# Patient Record
Sex: Female | Born: 1950 | Race: White | Hispanic: No | Smoking: Former smoker
Health system: Southern US, Community
[De-identification: ages and names within clinical notes are randomized; demographics above are authoritative.]

## PROBLEM LIST (undated history)

## (undated) DIAGNOSIS — G459 Transient cerebral ischemic attack, unspecified: Secondary | ICD-10-CM

## (undated) DIAGNOSIS — J45909 Unspecified asthma, uncomplicated: Secondary | ICD-10-CM

## (undated) DIAGNOSIS — R519 Headache, unspecified: Secondary | ICD-10-CM

## (undated) DIAGNOSIS — G4730 Sleep apnea, unspecified: Secondary | ICD-10-CM

## (undated) DIAGNOSIS — R0600 Dyspnea, unspecified: Secondary | ICD-10-CM

## (undated) DIAGNOSIS — Z8489 Family history of other specified conditions: Secondary | ICD-10-CM

## (undated) DIAGNOSIS — M797 Fibromyalgia: Secondary | ICD-10-CM

## (undated) DIAGNOSIS — K219 Gastro-esophageal reflux disease without esophagitis: Secondary | ICD-10-CM

## (undated) DIAGNOSIS — M5020 Other cervical disc displacement, unspecified cervical region: Secondary | ICD-10-CM

## (undated) DIAGNOSIS — K8020 Calculus of gallbladder without cholecystitis without obstruction: Secondary | ICD-10-CM

## (undated) DIAGNOSIS — T7840XA Allergy, unspecified, initial encounter: Secondary | ICD-10-CM

## (undated) DIAGNOSIS — I739 Peripheral vascular disease, unspecified: Secondary | ICD-10-CM

## (undated) DIAGNOSIS — E785 Hyperlipidemia, unspecified: Secondary | ICD-10-CM

## (undated) DIAGNOSIS — I639 Cerebral infarction, unspecified: Secondary | ICD-10-CM

## (undated) DIAGNOSIS — J449 Chronic obstructive pulmonary disease, unspecified: Secondary | ICD-10-CM

## (undated) DIAGNOSIS — I6529 Occlusion and stenosis of unspecified carotid artery: Secondary | ICD-10-CM

## (undated) DIAGNOSIS — F411 Generalized anxiety disorder: Secondary | ICD-10-CM

## (undated) DIAGNOSIS — I10 Essential (primary) hypertension: Secondary | ICD-10-CM

## (undated) DIAGNOSIS — I2510 Atherosclerotic heart disease of native coronary artery without angina pectoris: Secondary | ICD-10-CM

## (undated) DIAGNOSIS — M5126 Other intervertebral disc displacement, lumbar region: Secondary | ICD-10-CM

## (undated) HISTORY — DX: Atherosclerotic heart disease of native coronary artery without angina pectoris: I25.10

## (undated) HISTORY — DX: Essential (primary) hypertension: I10

## (undated) HISTORY — DX: Occlusion and stenosis of unspecified carotid artery: I65.29

## (undated) HISTORY — DX: Calculus of gallbladder without cholecystitis without obstruction: K80.20

## (undated) HISTORY — PX: TUBAL LIGATION: SHX77

## (undated) HISTORY — DX: Other cervical disc displacement, unspecified cervical region: M50.20

## (undated) HISTORY — DX: Hyperlipidemia, unspecified: E78.5

## (undated) HISTORY — DX: Cerebral infarction, unspecified: I63.9

## (undated) HISTORY — DX: Other intervertebral disc displacement, lumbar region: M51.26

## (undated) HISTORY — DX: Fibromyalgia: M79.7

## (undated) HISTORY — PX: CERVICAL FUSION: SHX112

## (undated) HISTORY — DX: Chronic obstructive pulmonary disease, unspecified: J44.9

## (undated) HISTORY — DX: Unspecified asthma, uncomplicated: J45.909

## (undated) HISTORY — DX: Allergy, unspecified, initial encounter: T78.40XA

## (undated) HISTORY — PX: CHOLECYSTECTOMY: SHX55

## (undated) HISTORY — DX: Generalized anxiety disorder: F41.1

## (undated) HISTORY — DX: Gastro-esophageal reflux disease without esophagitis: K21.9

## (undated) HISTORY — DX: Transient cerebral ischemic attack, unspecified: G45.9

---

## 1991-07-28 HISTORY — PX: ECTOPIC PREGNANCY SURGERY: SHX613

## 2000-04-29 ENCOUNTER — Emergency Department (HOSPITAL_COMMUNITY): Admission: EM | Admit: 2000-04-29 | Discharge: 2000-04-29 | Payer: Self-pay | Admitting: Emergency Medicine

## 2000-11-25 ENCOUNTER — Ambulatory Visit (HOSPITAL_COMMUNITY): Admission: RE | Admit: 2000-11-25 | Discharge: 2000-11-25 | Payer: Self-pay | Admitting: Family Medicine

## 2000-11-25 ENCOUNTER — Encounter: Payer: Self-pay | Admitting: Family Medicine

## 2000-12-07 ENCOUNTER — Other Ambulatory Visit: Admission: RE | Admit: 2000-12-07 | Discharge: 2000-12-07 | Payer: Self-pay | Admitting: Gynecology

## 2001-01-06 ENCOUNTER — Other Ambulatory Visit: Admission: RE | Admit: 2001-01-06 | Discharge: 2001-01-06 | Payer: Self-pay | Admitting: Gynecology

## 2001-01-06 ENCOUNTER — Encounter (INDEPENDENT_AMBULATORY_CARE_PROVIDER_SITE_OTHER): Payer: Self-pay | Admitting: Specialist

## 2001-01-20 ENCOUNTER — Encounter: Payer: Self-pay | Admitting: Family Medicine

## 2001-01-20 ENCOUNTER — Ambulatory Visit (HOSPITAL_COMMUNITY): Admission: RE | Admit: 2001-01-20 | Discharge: 2001-01-20 | Payer: Self-pay | Admitting: Family Medicine

## 2001-10-06 ENCOUNTER — Encounter: Admission: RE | Admit: 2001-10-06 | Discharge: 2001-10-06 | Payer: Self-pay | Admitting: Gynecology

## 2001-10-06 ENCOUNTER — Encounter: Payer: Self-pay | Admitting: Gynecology

## 2002-03-07 ENCOUNTER — Encounter: Payer: Self-pay | Admitting: Family Medicine

## 2002-03-07 ENCOUNTER — Ambulatory Visit (HOSPITAL_COMMUNITY): Admission: RE | Admit: 2002-03-07 | Discharge: 2002-03-07 | Payer: Self-pay | Admitting: Family Medicine

## 2003-12-13 ENCOUNTER — Ambulatory Visit (HOSPITAL_COMMUNITY): Admission: RE | Admit: 2003-12-13 | Discharge: 2003-12-13 | Payer: Self-pay | Admitting: Family Medicine

## 2004-08-07 ENCOUNTER — Ambulatory Visit (HOSPITAL_COMMUNITY): Admission: RE | Admit: 2004-08-07 | Discharge: 2004-08-07 | Payer: Self-pay | Admitting: Family Medicine

## 2004-08-15 ENCOUNTER — Ambulatory Visit (HOSPITAL_COMMUNITY): Admission: RE | Admit: 2004-08-15 | Discharge: 2004-08-15 | Payer: Self-pay | Admitting: Family Medicine

## 2004-08-26 ENCOUNTER — Other Ambulatory Visit: Admission: RE | Admit: 2004-08-26 | Discharge: 2004-08-26 | Payer: Self-pay | Admitting: Gynecology

## 2004-08-28 ENCOUNTER — Encounter: Admission: RE | Admit: 2004-08-28 | Discharge: 2004-08-28 | Payer: Self-pay | Admitting: Gynecology

## 2005-11-16 ENCOUNTER — Other Ambulatory Visit: Admission: RE | Admit: 2005-11-16 | Discharge: 2005-11-16 | Payer: Self-pay | Admitting: Gynecology

## 2005-12-14 ENCOUNTER — Encounter: Admission: RE | Admit: 2005-12-14 | Discharge: 2005-12-14 | Payer: Self-pay | Admitting: Gynecology

## 2007-10-10 ENCOUNTER — Other Ambulatory Visit: Admission: RE | Admit: 2007-10-10 | Discharge: 2007-10-10 | Payer: Self-pay | Admitting: Gynecology

## 2007-10-17 ENCOUNTER — Encounter: Admission: RE | Admit: 2007-10-17 | Discharge: 2007-10-17 | Payer: Self-pay | Admitting: Gynecology

## 2008-07-23 ENCOUNTER — Ambulatory Visit: Payer: Self-pay | Admitting: Surgery

## 2009-01-21 ENCOUNTER — Ambulatory Visit: Payer: Self-pay | Admitting: Surgery

## 2009-02-01 ENCOUNTER — Encounter: Payer: Self-pay | Admitting: Surgery

## 2009-02-01 ENCOUNTER — Ambulatory Visit: Payer: Self-pay | Admitting: Surgery

## 2009-02-01 ENCOUNTER — Inpatient Hospital Stay (HOSPITAL_COMMUNITY): Admission: RE | Admit: 2009-02-01 | Discharge: 2009-02-02 | Payer: Self-pay | Admitting: Surgery

## 2009-02-01 HISTORY — PX: CAROTID ENDARTERECTOMY: SUR193

## 2009-03-04 ENCOUNTER — Ambulatory Visit: Payer: Self-pay | Admitting: Surgery

## 2009-09-16 ENCOUNTER — Ambulatory Visit: Payer: Self-pay | Admitting: Surgery

## 2010-03-14 ENCOUNTER — Ambulatory Visit: Payer: Self-pay | Admitting: Surgery

## 2010-08-17 ENCOUNTER — Encounter: Payer: Self-pay | Admitting: Gynecology

## 2010-11-02 LAB — PROTIME-INR
INR: 0.9 (ref 0.00–1.49)
Prothrombin Time: 12 seconds (ref 11.6–15.2)

## 2010-11-02 LAB — URINALYSIS, ROUTINE W REFLEX MICROSCOPIC
Bilirubin Urine: NEGATIVE
Glucose, UA: NEGATIVE mg/dL
Hgb urine dipstick: NEGATIVE
Ketones, ur: NEGATIVE mg/dL
Nitrite: NEGATIVE
Protein, ur: NEGATIVE mg/dL
Specific Gravity, Urine: 1.004 — ABNORMAL LOW (ref 1.005–1.030)
Urobilinogen, UA: 0.2 mg/dL (ref 0.0–1.0)
pH: 6 (ref 5.0–8.0)

## 2010-11-02 LAB — COMPREHENSIVE METABOLIC PANEL
Albumin: 4 g/dL (ref 3.5–5.2)
BUN: 7 mg/dL (ref 6–23)
Chloride: 108 mEq/L (ref 96–112)
Creatinine, Ser: 0.74 mg/dL (ref 0.4–1.2)
Total Bilirubin: 0.4 mg/dL (ref 0.3–1.2)

## 2010-11-02 LAB — ABO/RH: ABO/RH(D): O POS

## 2010-11-02 LAB — BASIC METABOLIC PANEL
CO2: 29 mEq/L (ref 19–32)
Calcium: 8.7 mg/dL (ref 8.4–10.5)
Chloride: 107 mEq/L (ref 96–112)
Glucose, Bld: 116 mg/dL — ABNORMAL HIGH (ref 70–99)
Sodium: 140 mEq/L (ref 135–145)

## 2010-11-02 LAB — CBC
HCT: 37.4 % (ref 36.0–46.0)
HCT: 43.9 % (ref 36.0–46.0)
Hemoglobin: 12.8 g/dL (ref 12.0–15.0)
MCHC: 34.3 g/dL (ref 30.0–36.0)
MCV: 88.1 fL (ref 78.0–100.0)
MCV: 88.4 fL (ref 78.0–100.0)
Platelets: 221 10*3/uL (ref 150–400)
Platelets: 257 10*3/uL (ref 150–400)
RBC: 4.24 MIL/uL (ref 3.87–5.11)
RDW: 12.9 % (ref 11.5–15.5)
RDW: 13.4 % (ref 11.5–15.5)
WBC: 17.3 10*3/uL — ABNORMAL HIGH (ref 4.0–10.5)
WBC: 9.9 10*3/uL (ref 4.0–10.5)

## 2010-11-02 LAB — TYPE AND SCREEN
ABO/RH(D): O POS
Antibody Screen: NEGATIVE

## 2010-11-02 LAB — APTT: aPTT: 24 seconds (ref 24–37)

## 2010-12-09 NOTE — Assessment & Plan Note (Signed)
OFFICE VISIT   Norma Scott, Norma Scott  DOB:  [birth year]                                       03/04/2009  [id number]#:[id number]   REASON FOR VISIT:  Follow up carotid endarterectomy.   HISTORY:  This is a 60 year old female who was found to have an  asymptomatic high-grade right carotid stenosis.  She underwent right  carotid endarterectomy on February 01, 2009, without incident.  She comes  back in today for followup.  She is been doing well at home.  She does  not have any neurologic deficits.  Her incision is well-healed,   From a carotid perspective, she had contralateral 40-59% stenosis, and I  will see her back in 6 months to evaluate her operative site as well as  the contralateral side.   I initially saw the patient for left leg complaints.  She states that  she cannot walk 1 block secondary to pain in her left leg.  However, she  describes her pain as a burning, electrical-type feeling that begins in  her back and shoots down all the way to her foot.  She does have a  duplex ultrasound which suggests ankle-brachial index of 0.83.  She has  a palpable left dorsalis pedis pulse.  With all of the available  information, I feel like her pain is most likely secondary to back  issues and neurological issues rather than arterial insufficiencies.  I  have reiterated this to the patient, and she states that she has known 2  herniated disks.  I think she should look into this to further evaluate  the pain she has in her left leg.  I will see her back in 6 months with  a repeat duplex.   Jorge Ny, MD  Electronically Signed   VWB/MEDQ  D:  03/04/2009  T:  03/05/2009  Job:  [id number]   cc:   Dario Guardian, M.D.

## 2010-12-09 NOTE — Procedures (Signed)
CAROTID DUPLEX EXAM   INDICATION:  Follow up carotid artery disease.   HISTORY:  Diabetes:  No.  Cardiac:  No.  Hypertension:  No.  Smoking:  Yes.  Previous Surgery:  Left carotid endarterectomy, July, 2010.  CV History:  No.  Amaurosis Fugax No, Paresthesias No, Hemiparesis No.                                       RIGHT             LEFT  Brachial systolic pressure:         138               150  Brachial Doppler waveforms:         WNL               WNL  Vertebral direction of flow:        Antegrade         Antegrade  DUPLEX VELOCITIES (cm/sec)  CCA peak systolic                   108               124  ECA peak systolic                   281               160  ICA peak systolic                   118               164  ICA end diastolic                   43                57  PLAQUE MORPHOLOGY:                                    Heterogenous  PLAQUE AMOUNT:                                        Mild  PLAQUE LOCATION:                                      ICA, ECA   IMPRESSION:  1. No restenosis noted, status post right carotid endarterectomy.      Elevated velocities may be the result of a change in diameter of      vessel.  2. 40-59% stenosis of the left internal carotid artery.  3. Bilateral external carotid artery stenosis.  4. Antegrade flow in bilateral vertebrals.        ___________________________________________  V. Charlena Cross, MD   CB/MEDQ  D:  09/16/2009  T:  09/17/2009  Job:  [id number]

## 2010-12-09 NOTE — Op Note (Signed)
Norma Scott, Norma Scott            ACCOUNT NO.:  [id number]   MEDICAL RECORD NO.:  [id number]          PATIENT TYPE:  INP   LOCATION:  2899                         FACILITY:  [facility]   PHYSICIAN:  Juleen China IV, MDDATE OF BIRTH:  January 13, 1951   DATE OF PROCEDURE:  02/01/2009  DATE OF DISCHARGE:                               OPERATIVE REPORT   PREOPERATIVE DIAGNOSIS:  Asymptomatic right carotid stenosis.   POSTOPERATIVE DIAGNOSIS:  Asymptomatic right carotid stenosis.   PROCEDURE PERFORMED:  Right carotid endarterectomy with patch  angioplasty.   SURGEON:  1. Durene Cal IV, MD   ANESTHESIA:  General.   ESTIMATED BLOOD LOSS:  100 mL   FINDINGS:  85% stenosis.  No thrombus.   DRAINS:  None.   CULTURES:  None.   SPECIMENS:  Carotid plaque.   INDICATIONS:  This is a 60 year old female with strong family history  for stroke.  We have been following her carotid stenosis by Doppler,  most recently it has progressed to greater than 80%.  The patient is not  having symptoms.  She does describe some intermittent paresthesia of her  left leg over the past 2 weeks but otherwise she does not describe any  neurologic complaints.  She comes in today for elective carotid  endarterectomy.   PROCEDURE IN DETAIL:  The patient was identified in the holding area and  taken to room 6.  She was placed supine on the table.  General  endotracheal anesthesia was administered.  The patient was prepped and  draped in a standard sterile fashion.  A time-out was called.  Antibiotics were given.  An incision was made along the anterior border.  The right sternocleidomastoid.  Cautery was used to divide the  subcutaneous tissue.  The platysma muscle was divided with cautery.  Weitlaner retractor and cerebellar retractor were used for exposure.  The internal jugular vein was skeletonized along its anteromedial  border.  The common facial vein was identified and divided between 2-0  silk tie and  metal clips.  Next, the carotid sheath was opened sharply  with Metzenbaum scissors.  The common carotid artery was then  circumferentially dissected free and encircled with an umbilical tape.  The vagus nerve was identified and preserved.  Dissection was then  carried up along the medial border of the carotid.  The superior thyroid  artery was identified and encircled with a Potts 2-0 silk tie.  Next,  the external carotid was dissected free and encircled with a blue vessel  loop.  Dissection was then carried out along the anterior and lateral  border of the internal carotid artery.  The hypoglossal nerve was  visualized and protected.  Once adequate exposure of the internal  carotid artery was obtained, an umbilical tape was passed.  At this  point in time, the patient was given systemic heparinization.  After  heparin circulated, the internal, external, and common carotid artery  were all occluded.  A #11 blade was used to make an arteriotomy within  the common carotid artery.  The common and internal artery were opened  along the anterolateral border.  A 10-French shunt was then placed.  The  patient had approximately 85% stenosis.  There was no evidence of  thrombus.  Next, endarterectomy was performed using a Scientist, physiological.  Eversion endarterectomy was performed of the external carotid  artery.  A good distal endpoint was encountered in the internal carotid  artery and the plaque was removed.  The endarterectomized bed was  copiously irrigated.  All potential embolic debris was removed.  Two  tacking sutures were placed at the distal endpoint.  Next, a bovine  pericardial patch was selected.  Patch angioplasty was performed using  running 6-0 Prolene.  Prior to completion, the shunt was removed.  The  artery was then copiously irrigated with heparinized saline.  The  common, external, and internal artery were all back flushed.  The  anastomosis was then completed.  Clamp  was first released on the  external carotid artery followed by the common carotid artery.  Following approximately 15 seconds, the internal carotid clamp was  released.  One repair stitch was required.  Handheld Doppler was used to  evaluate signal in the common, external, and internal carotid artery,  and all had appropriate signals.  Next, the patient's heparin was  reversed with 50 mg of protamine.  Once hemostasis was adequate, the  carotid sheath was reapproximated with 3-0 Vicryl.  The platysma muscle  was reapproximated with 3-0 Vicryl and the skin was closed with running  4-0 Vicryl.  Dermabond was placed on the skin.  The patient was then  successfully awakened from anesthesia.  She was found to be moving all 4  extremities to command.  She could protrude her tongue, which was in the  midline.  She was taken to recovery room in stable condition.      Jorge Ny, MD  Electronically Signed     VWB/MEDQ  D:  02/01/2009  T:  02/01/2009  Job:  [id number]   cc:   Dario Guardian, M.D.
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
## 2010-12-09 NOTE — Procedures (Signed)
CAROTID DUPLEX EXAM   INDICATION:  Follow up carotid endarterectomy.   HISTORY:  Diabetes:  No.  Cardiac:  No.  Hypertension:  No.  Smoking:  Yes.  Previous Surgery:  Right carotid endarterectomy on 02/01/2009.  CV History:  Complaint of mild blurred vision and headaches.  Amaurosis Fugax No, Paresthesias No, Hemiparesis No.                                       RIGHT             LEFT  Brachial systolic pressure:         156               152  Brachial Doppler waveforms:         Normal            Normal  Vertebral direction of flow:        Antegrade         Antegrade  DUPLEX VELOCITIES (cm/sec)  CCA peak systolic                   87                107  ECA peak systolic                   211               116  ICA peak systolic                   109               147  ICA end diastolic                   43                61  PLAQUE MORPHOLOGY:                                    Heterogenous  PLAQUE AMOUNT:                      None              Moderate  PLAQUE LOCATION:                                      ICA/ECA   IMPRESSION:  1. Patent right carotid endarterectomy site with no hemodynamically      significant stenosis noted.  2. Doppler velocities suggest a 40% to 59% stenosis of the left      proximal internal carotid artery.  3. No significant change noted when compared to the previous      examination on 09/16/2009.        ___________________________________________  V. Charlena Cross, MD   CH/MEDQ  D:  03/17/2010  T:  03/17/2010  Job:  [id number]

## 2010-12-09 NOTE — Procedures (Signed)
CAROTID DUPLEX EXAM   INDICATION:  Carotid artery disease.   HISTORY:  Diabetes:  No.  Cardiac:  No.  Hypertension:  No.  Smoking:  Yes.  Previous Surgery:  No.  CV History:  No.  Amaurosis Fugax No, Paresthesias No, Hemiparesis No.                                       RIGHT             LEFT  Brachial systolic pressure:         144               146  Brachial Doppler waveforms:         WNL               WNL  Vertebral direction of flow:        Antegrade         Antegrade  DUPLEX VELOCITIES (cm/sec)  CCA peak systolic                   99                121  ECA peak systolic                   183               161  ICA peak systolic                   353               158  ICA end diastolic                   141               56  PLAQUE MORPHOLOGY:                  Calcific/mixed    Calcific/mixed  PLAQUE AMOUNT:                      Severe            Moderate  PLAQUE LOCATION:                    ICA/ECA           ICA   IMPRESSION:  1. Right ICA velocities are suggestive of 80% to 99% stenosis.  2. Left ICA velocities are suggestive of 40% to 59% stenosis.  3. Right ECA stenosis.   ___________________________________________  V. Charlena Cross, MD   AS/MEDQ  D:  01/21/2009  T:  01/21/2009  Job:  [id number]

## 2010-12-09 NOTE — Discharge Summary (Signed)
NAMEMARIZOL, BORROR            ACCOUNT NO.:  [id number]   MEDICAL RECORD NO.:  [id number]          PATIENT TYPE:  INP   LOCATION:  3307                         FACILITY:  [facility]   PHYSICIAN:  Juleen China IV, MDDATE OF BIRTH:  [birth year]   DATE OF ADMISSION:  02/01/2009  DATE OF DISCHARGE:  02/02/2009                               DISCHARGE SUMMARY   FINAL DIAGNOSIS:  Asymptomatic right carotid stenosis.   SECONDARY DIAGNOSES:  1. Fibromyalgia.  2. Esophageal reflux disease.  3. Allergic rhinitis.  4. Asthma.  5. Hypercholesterolemia.  6. History of cervical disk rupture x2.   SUMMARY OF HOSPITAL COURSE:  This is a 60 year old female who has been  followed for asymptomatic right carotid stenosis, which has recently  progressed to greater than 80% by Duplex ultrasound.  She comes in today  for right carotid endarterectomy.  She is asymptomatic.  The patient  underwent right carotid endarterectomy with Bovine pericardial patch  angioplasty on February 01, 2009.  Her postoperative course was  uncomplicated.  She was moving all 4 extremities to command.  Her tongue  was midline.  She has no evidence of any neurologic deficit.  On  postoperative day 1, she was stable for discharge.   DISCHARGE INSTRUCTIONS:  The patient will follow up within 2 weeks.   DISCHARGE MEDICATIONS:  1. Cilostazol 100 mg b.i.d.  2. Meloxicam 7.5 mg twice daily.  3. Zantac 150 mg daily.  4. Aspirin 81 mg per day.  5. Xanax 0.5 mg daily.  6. Percocet 2 tablets p.r.n. q.6 h.      Juleen China IV, MD  Electronically Signed     VWB/MEDQ  D:  02/02/2009  T:  02/02/2009  Job:  [id number]
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
## 2011-01-21 ENCOUNTER — Other Ambulatory Visit: Payer: Self-pay | Admitting: Gastroenterology

## 2011-03-16 ENCOUNTER — Other Ambulatory Visit: Payer: Self-pay

## 2011-04-06 ENCOUNTER — Other Ambulatory Visit (INDEPENDENT_AMBULATORY_CARE_PROVIDER_SITE_OTHER): Payer: 59 | Admitting: *Deleted

## 2011-04-06 DIAGNOSIS — Z48812 Encounter for surgical aftercare following surgery on the circulatory system: Secondary | ICD-10-CM

## 2011-04-06 DIAGNOSIS — I6529 Occlusion and stenosis of unspecified carotid artery: Secondary | ICD-10-CM

## 2011-04-15 ENCOUNTER — Encounter: Payer: Self-pay | Admitting: Surgery

## 2011-04-15 NOTE — Procedures (Unsigned)
CAROTID DUPLEX EXAM  INDICATION:  Carotid disease.  HISTORY: Diabetes:  No. Cardiac:  No. Hypertension:  No. Smoking:  Yes. Previous Surgery:  Right carotid endarterectomy on 02/01/2009. CV History:  Occasional dizziness. Amaurosis Fugax No, Paresthesias No, Hemiparesis No.                                      RIGHT             LEFT Brachial systolic pressure:         128               124 Brachial Doppler waveforms:         Normal            Normal Vertebral direction of flow:        Antegrade         Antegrade DUPLEX VELOCITIES (cm/sec) CCA peak systolic                   74                104 ECA peak systolic                   163               113 ICA peak systolic                   101               137 ICA end diastolic                   32                49 PLAQUE MORPHOLOGY:                                    Mixed PLAQUE AMOUNT:                      None              Mild PLAQUE LOCATION:                                      ICA/bifurcation  IMPRESSION: 1. Patent right carotid endarterectomy site with no right internal     carotid artery stenosis. 2. Doppler velocities suggest 40% to 59% stenosis of the left proximal     internal carotid artery. 3. No significant change noted when compared to the previous     examination on 03/14/2010.  ___________________________________________ V. Charlena Cross, MD  CH/MEDQ  D:  04/08/2011  T:  04/08/2011  Job:  [id number]

## 2011-04-16 ENCOUNTER — Other Ambulatory Visit: Payer: Self-pay | Admitting: Vascular Surgery

## 2011-04-16 DIAGNOSIS — I6529 Occlusion and stenosis of unspecified carotid artery: Secondary | ICD-10-CM

## 2011-04-16 DIAGNOSIS — Z48812 Encounter for surgical aftercare following surgery on the circulatory system: Secondary | ICD-10-CM

## 2011-12-15 ENCOUNTER — Other Ambulatory Visit: Payer: Self-pay | Admitting: Family Medicine

## 2011-12-15 DIAGNOSIS — Z1231 Encounter for screening mammogram for malignant neoplasm of breast: Secondary | ICD-10-CM

## 2012-01-01 ENCOUNTER — Ambulatory Visit
Admission: RE | Admit: 2012-01-01 | Discharge: 2012-01-01 | Disposition: A | Discharge status: Home / Self Care | Payer: 59 | Source: Ambulatory Visit | Attending: Family Medicine | Admitting: Family Medicine

## 2012-01-01 DIAGNOSIS — Z1231 Encounter for screening mammogram for malignant neoplasm of breast: Secondary | ICD-10-CM

## 2012-01-04 ENCOUNTER — Other Ambulatory Visit: Payer: Self-pay | Admitting: Physician Assistant

## 2012-01-04 ENCOUNTER — Other Ambulatory Visit (HOSPITAL_COMMUNITY)
Admission: RE | Admit: 2012-01-04 | Discharge: 2012-01-04 | Disposition: A | Discharge status: Home / Self Care | Payer: 59 | Source: Ambulatory Visit | Attending: Family Medicine | Admitting: Family Medicine

## 2012-01-04 DIAGNOSIS — Z0000 Encounter for general adult medical examination without abnormal findings: Secondary | ICD-10-CM | POA: Insufficient documentation

## 2012-01-07 ENCOUNTER — Other Ambulatory Visit: Payer: Self-pay | Admitting: Family Medicine

## 2012-01-07 DIAGNOSIS — M545 Low back pain: Secondary | ICD-10-CM

## 2012-01-11 ENCOUNTER — Ambulatory Visit
Admission: RE | Admit: 2012-01-11 | Discharge: 2012-01-11 | Disposition: A | Discharge status: Home / Self Care | Payer: 59 | Source: Ambulatory Visit | Attending: Family Medicine | Admitting: Family Medicine

## 2012-01-11 ENCOUNTER — Encounter: Payer: Self-pay | Admitting: Surgery

## 2012-01-11 ENCOUNTER — Other Ambulatory Visit: Payer: Self-pay

## 2012-01-11 DIAGNOSIS — I739 Peripheral vascular disease, unspecified: Secondary | ICD-10-CM

## 2012-01-11 DIAGNOSIS — M545 Low back pain: Secondary | ICD-10-CM

## 2012-01-26 ENCOUNTER — Other Ambulatory Visit: Payer: Self-pay | Admitting: Neurosurgery

## 2012-02-03 ENCOUNTER — Encounter (HOSPITAL_COMMUNITY): Payer: Self-pay | Admitting: Respiratory Therapy

## 2012-02-05 ENCOUNTER — Encounter: Payer: Self-pay | Admitting: Surgery

## 2012-02-08 ENCOUNTER — Encounter: Payer: Self-pay | Admitting: Surgery

## 2012-02-08 ENCOUNTER — Ambulatory Visit (INDEPENDENT_AMBULATORY_CARE_PROVIDER_SITE_OTHER): Payer: 59 | Admitting: Surgery

## 2012-02-08 ENCOUNTER — Encounter (INDEPENDENT_AMBULATORY_CARE_PROVIDER_SITE_OTHER): Payer: 59 | Admitting: *Deleted

## 2012-02-08 VITALS — BP 150/78 | HR 81 | Temp 98.1°F | Ht 60.0 in | Wt 153.0 lb

## 2012-02-08 DIAGNOSIS — I739 Peripheral vascular disease, unspecified: Secondary | ICD-10-CM

## 2012-02-08 DIAGNOSIS — I6529 Occlusion and stenosis of unspecified carotid artery: Secondary | ICD-10-CM

## 2012-02-08 NOTE — Progress Notes (Signed)
[facility] [place]   Patient name: Norma Scott MRN: [id number] DOB: 10-18-1950 Sex: female     Chief Complaint  Patient presents with  . PAD    referred by Laurell Josephs NP -  pt c/o of pain in both legs but worse in left also states she falls often - "energy just goes away in my legs"    HISTORY OF PRESENT ILLNESS: The patient is here today for evaluation of bilateral leg pain. I initially saw her for left leg symptoms but I felt they were most likely neuropathic in origin. She ultimately was found to have a high-grade carotid stenosis and underwent right carotid endarterectomy. She is to today because she continues to have problems with both of her legs. The left leg bothers her more than the right. She describes a shooting pain from her back down to her toe. She also states that both legs give out and that her left leg problems are with walking. She does describe her toes changing color to a more purplish color. She is scheduled to undergo back surgery later this month. She does not have rest pain or nonhealing ulcers.  The patient continues to be without carotid symptoms. She continues to be treated for her hypercholesterolemia. She continues to smoke  Past Medical History  Diagnosis Date  . Carotid artery occlusion   . Fibromyalgia   . Esophageal reflux   . Asthma   . Hyperlipidemia   . Gall stones   . Cervical disc displacement     x2  . Lumbar disc herniation     L5-S1  . Allergy     allergic rhinitis  . COPD (chronic obstructive pulmonary disease)   . Generalized anxiety disorder   . CAD (coronary artery disease)     Past Surgical History  Procedure Date  . Carotid endarterectomy 02/01/2009    Right  . Spine surgery     cervical fusion x 2  . Tubal ligation   . Cervical fusion     1995, 1996    History   Social History  . Marital Status: Legally Separated    Spouse Name: N/A    Number of Children: N/A  . Years of Education: N/A    Occupational History  . Not on file.   Social History Main Topics  . Smoking status: Current Everyday Smoker -- 0.5 packs/day    Types: Cigarettes  . Smokeless tobacco: Never Used   Comment: pt states that she is taking chantix trying to quit  . Alcohol Use: Yes     Occasional  . Drug Use: No  . Sexually Active: Not on file   Other Topics Concern  . Not on file   Social History Narrative  . No narrative on file    Family History  Problem Relation Age of Onset  . Stroke Mother   . Hypertension Mother   . Hyperlipidemia Mother   . Diabetes Father   . Heart disease Paternal Uncle   . Stroke Maternal Grandmother   . Cancer Maternal Grandfather     stomach cancer  . Heart disease Paternal Grandfather     Allergies as of 02/08/2012 - Review Complete 02/08/2012  Allergen Reaction Noted  . Sulfa antibiotics  01/11/2012  . Zithromax (azithromycin)  01/11/2012  . Cymbalta (duloxetine hcl)  01/11/2012  . Septra (sulfamethoxazole-tmp ds)  01/11/2012  . Wellbutrin (bupropion)  01/11/2012    Current Outpatient Prescriptions on File Prior to Visit  Medication  Sig Dispense Refill  . alendronate (FOSAMAX) 70 MG tablet Take 70 mg by mouth every 7 (seven) days. Take with a full glass of water on an empty stomach. Take on mondays      . aspirin 81 MG tablet Take 81 mg by mouth daily.      . Biotin 5000 MCG TABS Take 5,000 mcg by mouth.      . cetirizine (ZYRTEC) 10 MG tablet Take 10 mg by mouth daily.      . clonazePAM (KLONOPIN) 0.5 MG tablet Take 0.5 mg by mouth daily.      . cyclobenzaprine (FLEXERIL) 10 MG tablet Take 20 mg by mouth at bedtime as needed.       . diclofenac (VOLTAREN) 75 MG EC tablet Take 75 mg by mouth at bedtime.       Marland Kitchen FLUoxetine (PROZAC) 20 MG capsule Take 60 mg by mouth daily.       . fluticasone (FLONASE) 50 MCG/ACT nasal spray Place 2 sprays into the nose daily.      . Olopatadine HCl (PATADAY) 0.2 % SOLN Place 1 drop into both eyes daily as needed.        . Omega-3 Fatty Acids (FISH OIL TRIPLE STRENGTH) 1400 MG CAPS Take 1,400 mg by mouth daily.      Marland Kitchen omeprazole (PRILOSEC) 20 MG capsule Take 20 mg by mouth daily.      . varenicline (CHANTIX) 1 MG tablet Take 1 mg by mouth 2 (two) times daily.         REVIEW OF SYSTEMS: Cardiovascular: No chest pain, chest pressure, positive for pain in her legs with walking Pulmonary: No productive cough, asthma or wheezing. Neurologic: No weakness, paresthesias, aphasia, or amaurosis. No dizziness. Hematologic: No bleeding problems or clotting disorders. Musculoskeletal: No joint pain or joint swelling. Gastrointestinal: No blood in stool or hematemesis Genitourinary: No dysuria or hematuria. Psychiatric:: No history of major depression. Integumentary: No rashes or ulcers. Constitutional: No fever or chills.  PHYSICAL EXAMINATION:   Vital signs are BP 150/78  Pulse 81  Temp 98.1 F (36.7 C) (Oral)  Ht 5' (1.524 m)  Wt 153 lb (69.4 kg)  BMI 29.88 kg/m2  SpO2 100% General: The patient appears their stated age. HEENT:  No gross abnormalities Pulmonary:  Non labored breathing Musculoskeletal: There are no major deformities. Neurologic: No focal weakness or paresthesias are detected, Skin: There are no ulcer or rashes noted. Psychiatric: The patient has normal affect. Cardiovascular: There is a regular rate and rhythm without significant murmur appreciated. No carotid bruits. Pedal pulses are not palpable.   Diagnostic Studies I have ordered and reviewed her Doppler study today. Her ABI on the left is 0.66. This has decreased from 0.83. On the right her ABI 0.93.  Assessment: Bilateral leg pain, left greater than right Plan: I continue to feel that her symptoms are most in line with degenerative back disease and neuropathic pain. However, she has had a decrease in her ankle-brachial indices which does indicate progression of her arterial insufficiency. Since she is scheduled to undergo back  surgery in 2 weeks I would like to see what her symptoms are once she has recovered from this operation. She may require further evaluation of her arterial insufficiency in the form of an arteriogram however I will make this decision based on her symptoms when she comes back to see me, fully recovered from her back surgery, which will be in 3 months.  Jorge Ny, M.D. [facility] [place] Office: [phone] Pager:  [phone]

## 2012-02-12 ENCOUNTER — Encounter (HOSPITAL_COMMUNITY)
Admission: RE | Admit: 2012-02-12 | Discharge: 2012-02-12 | Disposition: A | Discharge status: Home / Self Care | Payer: 59 | Source: Ambulatory Visit | Attending: Neurosurgery | Admitting: Neurosurgery

## 2012-02-12 ENCOUNTER — Ambulatory Visit (HOSPITAL_COMMUNITY)
Admission: RE | Admit: 2012-02-12 | Discharge: 2012-02-12 | Disposition: A | Discharge status: Home / Self Care | Payer: 59 | Source: Ambulatory Visit | Attending: Neurosurgery | Admitting: Neurosurgery

## 2012-02-12 ENCOUNTER — Encounter (HOSPITAL_COMMUNITY): Payer: Self-pay

## 2012-02-12 DIAGNOSIS — Z01818 Encounter for other preprocedural examination: Secondary | ICD-10-CM | POA: Insufficient documentation

## 2012-02-12 DIAGNOSIS — J4489 Other specified chronic obstructive pulmonary disease: Secondary | ICD-10-CM | POA: Insufficient documentation

## 2012-02-12 DIAGNOSIS — J449 Chronic obstructive pulmonary disease, unspecified: Secondary | ICD-10-CM | POA: Insufficient documentation

## 2012-02-12 DIAGNOSIS — Z01812 Encounter for preprocedural laboratory examination: Secondary | ICD-10-CM | POA: Insufficient documentation

## 2012-02-12 LAB — TYPE AND SCREEN
ABO/RH(D): O POS
Antibody Screen: NEGATIVE

## 2012-02-12 LAB — BASIC METABOLIC PANEL
BUN: 4 mg/dL — ABNORMAL LOW (ref 6–23)
CO2: 29 mEq/L (ref 19–32)
Calcium: 8.9 mg/dL (ref 8.4–10.5)
Chloride: 101 mEq/L (ref 96–112)
Creatinine, Ser: 0.58 mg/dL (ref 0.50–1.10)
GFR calc Af Amer: >90 mL/min (ref >90)
GFR calc non Af Amer: >90 mL/min (ref >90)
Glucose, Bld: 89 mg/dL (ref 70–99)
Potassium: 3.2 mEq/L — ABNORMAL LOW (ref 3.5–5.1)
Sodium: 139 mEq/L (ref 135–145)

## 2012-02-12 LAB — CBC
HCT: 41.9 % (ref 36.0–46.0)
Hemoglobin: 13.6 g/dL (ref 12.0–15.0)
MCH: 28.9 pg (ref 26.0–34.0)
MCHC: 32.5 g/dL (ref 30.0–36.0)
MCV: 89.1 fL (ref 78.0–100.0)
Platelets: 230 10*3/uL (ref 150–400)
RBC: 4.7 MIL/uL (ref 3.87–5.11)
RDW: 12.8 % (ref 11.5–15.5)
WBC: 9.3 10*3/uL (ref 4.0–10.5)

## 2012-02-12 LAB — SURGICAL PCR SCREEN
MRSA, PCR: NEGATIVE
Staphylococcus aureus: NEGATIVE

## 2012-02-12 NOTE — Pre-Procedure Instructions (Addendum)
20 ALEETA SCHMALTZ  02/12/2012   Your procedure is scheduled on: 02/18/12*  Report to [facility] at 700 AM.  Call this number if you have problems the morning of surgery: [phone]   Remember:   Do not eat foodor drink:After Midnight.    Take these medicines the morning of surgery with A SIP OF WATER: ,prednisone, chantix, zyrtec, prilosec, clonazepam, cyclobenzaprine,prozac,flonase  STOP aspirin, biotin, voltaren, omega 3 fish oil now   Do not wear jewelry, make-up or nail polish.  Do not wear lotions, powders, or perfumes. You may wear deodorant.  Do not shave 48 hours prior to surgery. Men may shave face and neck.  Do not bring valuables to the hospital.  Contacts, dentures or bridgework may not be worn into surgery.  Leave suitcase in the car. After surgery it may be brought to your room.  For patients admitted to the hospital, checkout time is 11:00 AM the day of discharge.   Patients discharged the day of surgery will not be allowed to drive home.  Name and phone number of your driver: beverly friend [phone]  gabbi dtr [phone]  Special Instructions: CHG Shower Use Special Wash: 1/2 bottle night before surgery and 1/2 bottle morning of surgery.   Please read over the following fact sheets that you were given: Pain Booklet, Coughing and Deep Breathing, Blood Transfusion Information, MRSA Information and Surgical Site Infection Prevention

## 2012-02-12 NOTE — Progress Notes (Signed)
req'd ekg from pmp dr candace Halina Andreas

## 2012-02-18 ENCOUNTER — Ambulatory Visit (HOSPITAL_COMMUNITY): Admission: RE | Admit: 2012-02-18 | Payer: 59 | Source: Ambulatory Visit | Admitting: Neurosurgery

## 2012-02-18 ENCOUNTER — Encounter (HOSPITAL_COMMUNITY): Admission: RE | Payer: Self-pay | Source: Ambulatory Visit

## 2012-02-18 ENCOUNTER — Other Ambulatory Visit: Payer: Self-pay | Admitting: Neurosurgery

## 2012-02-18 DIAGNOSIS — M542 Cervicalgia: Secondary | ICD-10-CM

## 2012-02-18 SURGERY — POSTERIOR LUMBAR FUSION 1 LEVEL
Anesthesia: General | Site: Back

## 2012-02-22 ENCOUNTER — Ambulatory Visit
Admission: RE | Admit: 2012-02-22 | Discharge: 2012-02-22 | Disposition: A | Discharge status: Home / Self Care | Payer: 59 | Source: Ambulatory Visit | Attending: Neurosurgery | Admitting: Neurosurgery

## 2012-02-22 DIAGNOSIS — M542 Cervicalgia: Secondary | ICD-10-CM

## 2012-03-21 ENCOUNTER — Other Ambulatory Visit: Payer: Self-pay | Admitting: Neurosurgery

## 2012-03-21 DIAGNOSIS — M542 Cervicalgia: Secondary | ICD-10-CM

## 2012-03-24 ENCOUNTER — Ambulatory Visit
Admission: RE | Admit: 2012-03-24 | Discharge: 2012-03-24 | Disposition: A | Discharge status: Home / Self Care | Payer: 59 | Source: Ambulatory Visit | Attending: Neurosurgery | Admitting: Neurosurgery

## 2012-03-24 VITALS — BP 160/71 | HR 79

## 2012-03-24 DIAGNOSIS — M542 Cervicalgia: Secondary | ICD-10-CM

## 2012-03-24 MED ORDER — IOHEXOL 300 MG/ML  SOLN
1.0000 mL | Freq: Once | INTRAMUSCULAR | Status: AC | PRN
  Administered 2012-03-24: 1 mL via EPIDURAL

## 2012-03-24 MED ORDER — TRIAMCINOLONE ACETONIDE 40 MG/ML IJ SUSP (RADIOLOGY)
60.0000 mg | Freq: Once | INTRAMUSCULAR | Status: AC
  Administered 2012-03-24: 60 mg via EPIDURAL

## 2012-04-25 ENCOUNTER — Ambulatory Visit: Payer: 59 | Admitting: Surgery

## 2012-04-25 ENCOUNTER — Other Ambulatory Visit: Payer: 59

## 2012-04-26 ENCOUNTER — Other Ambulatory Visit: Payer: Self-pay | Admitting: Neurosurgery

## 2012-05-13 ENCOUNTER — Encounter: Payer: Self-pay | Admitting: Surgery

## 2012-05-16 ENCOUNTER — Other Ambulatory Visit (INDEPENDENT_AMBULATORY_CARE_PROVIDER_SITE_OTHER): Payer: 59 | Admitting: *Deleted

## 2012-05-16 ENCOUNTER — Encounter: Payer: Self-pay | Admitting: Surgery

## 2012-05-16 ENCOUNTER — Ambulatory Visit (INDEPENDENT_AMBULATORY_CARE_PROVIDER_SITE_OTHER): Payer: 59 | Admitting: Surgery

## 2012-05-16 ENCOUNTER — Encounter (INDEPENDENT_AMBULATORY_CARE_PROVIDER_SITE_OTHER): Payer: 59 | Admitting: *Deleted

## 2012-05-16 VITALS — BP 186/92 | HR 69 | Resp 18 | Ht 60.0 in | Wt 155.0 lb

## 2012-05-16 DIAGNOSIS — I739 Peripheral vascular disease, unspecified: Secondary | ICD-10-CM | POA: Insufficient documentation

## 2012-05-16 DIAGNOSIS — I6523 Occlusion and stenosis of bilateral carotid arteries: Secondary | ICD-10-CM | POA: Insufficient documentation

## 2012-05-16 DIAGNOSIS — I6529 Occlusion and stenosis of unspecified carotid artery: Secondary | ICD-10-CM

## 2012-05-16 DIAGNOSIS — Z48812 Encounter for surgical aftercare following surgery on the circulatory system: Secondary | ICD-10-CM

## 2012-05-16 NOTE — Progress Notes (Signed)
[facility] [place]   Patient name: Norma Scott MRN: [id number] DOB: 05-Dec-1950 Sex: female    No chief complaint on file.   HISTORY OF PRESENT ILLNESS: The patient comes back today for followup. I saw her approximately 3 months ago for left leg symptoms however I felt they were most likely neuropathic in origin. She was supposed to have back surgery done however this has been delayed for another 2 weeks. She continues to have numbness and pain tissue down her left leg. She also endorses her legs giving out with walking, the left more so than the right. She has noticed a change in color to her toes. She does not have any ulcerations.  I am also following the patient for carotid occlusive disease. She is status post right carotid endarterectomy for asymptomatic stenosis in July 2010.  Past Medical History  Diagnosis Date  . Carotid artery occlusion   . Fibromyalgia   . Esophageal reflux   . Asthma   . Hyperlipidemia   . Gall stones   . Cervical disc displacement     x2  . Lumbar disc herniation     L5-S1  . Allergy     allergic rhinitis  . COPD (chronic obstructive pulmonary disease)   . Generalized anxiety disorder   . CAD (coronary artery disease)     patient says no problems    Past Surgical History  Procedure Date  . Carotid endarterectomy 02/01/2009    Right  . Spine surgery     cervical fusion x 2  . Tubal ligation   . Cervical fusion     1995, 1996  . Ectopic pregnancy surgery 61    History   Social History  . Marital Status: Legally Separated    Spouse Name: N/A    Number of Children: N/A  . Years of Education: N/A   Occupational History  . Not on file.   Social History Main Topics  . Smoking status: Current Every Day Smoker -- 0.5 packs/day for 45 years    Types: Cigarettes  . Smokeless tobacco: Never Used   Comment: pt states that she is taking chantix trying to quit occ alcohol  . Alcohol Use: Yes     Occasional  .  Drug Use: No  . Sexually Active: Not on file   Other Topics Concern  . Not on file   Social History Narrative  . No narrative on file    Family History  Problem Relation Age of Onset  . Stroke Mother   . Hypertension Mother   . Hyperlipidemia Mother   . Diabetes Father   . Heart disease Paternal Uncle   . Stroke Maternal Grandmother   . Cancer Maternal Grandfather     stomach cancer  . Heart disease Paternal Grandfather     Allergies as of 05/16/2012 - Review Complete 05/16/2012  Allergen Reaction Noted  . Wellbutrin (bupropion) Other (See Comments) 01/11/2012  . Septra (sulfamethoxazole-tmp ds) Nausea And Vomiting 01/11/2012  . Sulfa antibiotics Nausea And Vomiting 01/11/2012  . Zithromax (azithromycin) Hives 01/11/2012  . Cymbalta (duloxetine hcl)  01/11/2012    Current Outpatient Prescriptions on File Prior to Visit  Medication Sig Dispense Refill  . alendronate (FOSAMAX) 70 MG tablet Take 70 mg by mouth every 7 (seven) days. Take with a full glass of water on an empty stomach. Take on mondays      . aspirin 81 MG tablet Take 81 mg by mouth daily.      Marland Kitchen  Biotin 5000 MCG TABS Take 5,000 mcg by mouth.      . cetirizine (ZYRTEC) 10 MG tablet Take 10 mg by mouth daily.      . clonazePAM (KLONOPIN) 0.5 MG tablet Take 0.5 mg by mouth daily.      . diclofenac (VOLTAREN) 75 MG EC tablet Take 75 mg by mouth at bedtime.       Marland Kitchen FLUoxetine (PROZAC) 20 MG capsule Take 60 mg by mouth daily.       . fluticasone (FLONASE) 50 MCG/ACT nasal spray Place 2 sprays into the nose daily.      . Olopatadine HCl (PATADAY) 0.2 % SOLN Place 1 drop into both eyes daily as needed.       Marland Kitchen omeprazole (PRILOSEC) 20 MG capsule Take 20 mg by mouth daily.      . rosuvastatin (CRESTOR) 10 MG tablet Take 10 mg by mouth daily.      . cyclobenzaprine (FLEXERIL) 10 MG tablet Take 20 mg by mouth at bedtime as needed.       . Omega-3 Fatty Acids (FISH OIL TRIPLE STRENGTH) 1400 MG CAPS Take 1,400 mg by mouth  daily.      . predniSONE (DELTASONE) 10 MG tablet Take 10 mg by mouth daily. Decreasing dose      . varenicline (CHANTIX) 1 MG tablet Take 1 mg by mouth 2 (two) times daily.         REVIEW OF SYSTEMS: No changes since prior visit PHYSICAL EXAMINATION:   Vital signs are BP 186/92  Pulse 69  Resp 18  Ht 5' (1.524 m)  Wt 155 lb (70.308 kg)  BMI 30.27 kg/m2  SpO2 96% General: The patient appears their stated age. HEENT:  No gross abnormalities Pulmonary:  Non labored breathing Abdomen: Soft and non-tender Musculoskeletal: There are no major deformities. Neurologic: No focal weakness or paresthesias are detected, Skin: There are no ulcer or rashes noted. Psychiatric: The patient has normal affect. Cardiovascular: There is a regular rate and rhythm without significant murmur appreciated. Pedal pulses are not palpable. No carotid bruits.   Diagnostic Studies Ultrasound reveals no stenosis in the right carotid artery. There is a 40-59% left carotid stenosis. ABI on the right is 0.97 on the left is 0.73.  Assessment: #1: Carotid occlusive disease #2: Lower extremity arterial insufficiency Plan: #1: The patient will continue with yearly surveillance as long as her stenosis remains in the 40-59% stenosis category. She is asymptomatic. #2: I would like the patient come back in 6 months to be evaluated after her back surgery. I suspect that most of her lower extremity symptoms will resolve. If not, the next step would be to proceed with angiography.  Jorge Ny, M.D. [facility] [place] Office: [phone] Pager:  [phone]

## 2012-05-17 ENCOUNTER — Encounter (HOSPITAL_COMMUNITY): Payer: Self-pay | Admitting: Pharmacy Technician

## 2012-05-18 NOTE — Addendum Note (Signed)
Addended by: Sharee Pimple on: 05/18/2012 09:20 AM   Modules accepted: Orders

## 2012-05-24 ENCOUNTER — Ambulatory Visit (HOSPITAL_COMMUNITY)
Admission: RE | Admit: 2012-05-24 | Discharge: 2012-05-24 | Disposition: A | Discharge status: Home / Self Care | Payer: 59 | Source: Ambulatory Visit | Attending: Neurosurgery | Admitting: Neurosurgery

## 2012-05-24 ENCOUNTER — Encounter (HOSPITAL_COMMUNITY): Payer: Self-pay

## 2012-05-24 ENCOUNTER — Encounter (HOSPITAL_COMMUNITY)
Admission: RE | Admit: 2012-05-24 | Discharge: 2012-05-24 | Disposition: A | Discharge status: Home / Self Care | Payer: 59 | Source: Ambulatory Visit | Attending: Neurosurgery | Admitting: Neurosurgery

## 2012-05-24 DIAGNOSIS — Z01810 Encounter for preprocedural cardiovascular examination: Secondary | ICD-10-CM | POA: Insufficient documentation

## 2012-05-24 DIAGNOSIS — Z01818 Encounter for other preprocedural examination: Secondary | ICD-10-CM | POA: Insufficient documentation

## 2012-05-24 DIAGNOSIS — Z01812 Encounter for preprocedural laboratory examination: Secondary | ICD-10-CM | POA: Insufficient documentation

## 2012-05-24 LAB — BASIC METABOLIC PANEL
BUN: 5 mg/dL — ABNORMAL LOW (ref 6–23)
CO2: 30 mEq/L (ref 19–32)
Calcium: 9.2 mg/dL (ref 8.4–10.5)
Chloride: 102 mEq/L (ref 96–112)
Creatinine, Ser: 0.59 mg/dL (ref 0.50–1.10)
GFR calc Af Amer: >90 mL/min (ref >90)
GFR calc non Af Amer: >90 mL/min (ref >90)
Glucose, Bld: 63 mg/dL — ABNORMAL LOW (ref 70–99)
Potassium: 3 mEq/L — ABNORMAL LOW (ref 3.5–5.1)
Sodium: 139 mEq/L (ref 135–145)

## 2012-05-24 LAB — CBC
HCT: 43.2 % (ref 36.0–46.0)
Hemoglobin: 14.2 g/dL (ref 12.0–15.0)
MCH: 28.8 pg (ref 26.0–34.0)
MCHC: 32.9 g/dL (ref 30.0–36.0)
MCV: 87.6 fL (ref 78.0–100.0)
Platelets: 237 10*3/uL (ref 150–400)
RBC: 4.93 MIL/uL (ref 3.87–5.11)
RDW: 12.5 % (ref 11.5–15.5)
WBC: 8.2 10*3/uL (ref 4.0–10.5)

## 2012-05-24 LAB — SURGICAL PCR SCREEN
MRSA, PCR: NEGATIVE
Staphylococcus aureus: NEGATIVE

## 2012-05-24 LAB — TYPE AND SCREEN
ABO/RH(D): O POS
Antibody Screen: NEGATIVE

## 2012-05-24 NOTE — Progress Notes (Signed)
Will show Revonda Standard the lab results.Abn K

## 2012-05-24 NOTE — Pre-Procedure Instructions (Signed)
20 Norma Scott  05/24/2012   Your procedure is scheduled on:  Wed, Nov 6 @ 8:30 AM  Report to [facility] at 6:30 AM.  Call this number if you have problems the morning of surgery: [phone]   Remember:   Do not eat food:After Midnight.    Take these medicines the morning of surgery with A SIP OF WATER: Omeprazole(Prilosec),Fluticasone(Flonase),Fluoxetine(Prozac),Clonazepam(Klonopin), and Cetirizine(Zyrtec)   Do not wear jewelry, make-up or nail polish.  Do not wear lotions, powders, or perfumes. You may wear deodorant.  Do not shave 48 hours prior to surgery.   Do not bring valuables to the hospital.  Contacts, dentures or bridgework may not be worn into surgery.  Leave suitcase in the car. After surgery it may be brought to your room.  For patients admitted to the hospital, checkout time is 11:00 AM the day of discharge.   Patients discharged the day of surgery will not be allowed to drive home.    Special Instructions: Shower using CHG 2 nights before surgery and the night before surgery.  If you shower the day of surgery use CHG.  Use special wash - you have one bottle of CHG for all showers.  You should use approximately 1/3 of the bottle for each shower.   Please read over the following fact sheets that you were given: Pain Booklet, Coughing and Deep Breathing, Blood Transfusion Information, MRSA Information and Surgical Site Infection Prevention

## 2012-05-25 NOTE — Consult Note (Signed)
Anesthesia Chart Review:  Patient is a 61 year old female scheduled for L5 Gill procedure with L5-S1 PLIF on 06/01/12 by Dr. Newell Coral.  History includes PAD s/p right CEA '10 (patent right CEA, 40-59% LICAS 04/2012; Dr. Myra Gianotti), fibromyalgia, asthma, HLD, COPD, anxiety, cervical fusion, smoking, obesity, esophageal reflux.  Health history also lists CAD, but patient denies this history.  PCP is Dr. Merri Brunette.   Labs noted.  K+ 3.0.  CXR on 05/24/12 showed no active lung disease.  EKG on 05/24/12 showed NSR.  If no significant change in her status then anticipate she can proceed as planned.  Shonna Chock, PA-C

## 2012-05-31 MED ORDER — CEFAZOLIN SODIUM-DEXTROSE 2-3 GM-% IV SOLR
2.0000 g | INTRAVENOUS | Status: AC
  Administered 2012-06-01: 2 g via INTRAVENOUS
  Administered 2012-06-01: 1 g via INTRAVENOUS
  Filled 2012-05-31: qty 50

## 2012-06-01 ENCOUNTER — Encounter (HOSPITAL_COMMUNITY)
Admission: RE | Disposition: A | Discharge status: Home / Self Care | Payer: Self-pay | Source: Ambulatory Visit | Attending: Neurosurgery

## 2012-06-01 ENCOUNTER — Inpatient Hospital Stay (HOSPITAL_COMMUNITY): Payer: 59

## 2012-06-01 ENCOUNTER — Observation Stay (HOSPITAL_COMMUNITY)
Admission: RE | Admit: 2012-06-01 | Discharge: 2012-06-03 | Disposition: A | Discharge status: Home / Self Care | Payer: 59 | Source: Ambulatory Visit | Attending: Neurosurgery | Admitting: Neurosurgery

## 2012-06-01 ENCOUNTER — Observation Stay (HOSPITAL_COMMUNITY): Payer: 59

## 2012-06-01 ENCOUNTER — Inpatient Hospital Stay (HOSPITAL_COMMUNITY): Payer: 59 | Admitting: Vascular Surgery

## 2012-06-01 ENCOUNTER — Encounter (HOSPITAL_COMMUNITY): Payer: Self-pay | Admitting: Vascular Surgery

## 2012-06-01 ENCOUNTER — Encounter (HOSPITAL_COMMUNITY): Payer: Self-pay | Admitting: *Deleted

## 2012-06-01 DIAGNOSIS — J449 Chronic obstructive pulmonary disease, unspecified: Secondary | ICD-10-CM | POA: Insufficient documentation

## 2012-06-01 DIAGNOSIS — J4489 Other specified chronic obstructive pulmonary disease: Secondary | ICD-10-CM | POA: Insufficient documentation

## 2012-06-01 DIAGNOSIS — I6529 Occlusion and stenosis of unspecified carotid artery: Secondary | ICD-10-CM | POA: Insufficient documentation

## 2012-06-01 DIAGNOSIS — I2510 Atherosclerotic heart disease of native coronary artery without angina pectoris: Secondary | ICD-10-CM | POA: Insufficient documentation

## 2012-06-01 DIAGNOSIS — Q762 Congenital spondylolisthesis: Secondary | ICD-10-CM | POA: Insufficient documentation

## 2012-06-01 DIAGNOSIS — K219 Gastro-esophageal reflux disease without esophagitis: Secondary | ICD-10-CM | POA: Insufficient documentation

## 2012-06-01 DIAGNOSIS — M47817 Spondylosis without myelopathy or radiculopathy, lumbosacral region: Principal | ICD-10-CM | POA: Insufficient documentation

## 2012-06-01 HISTORY — PX: BACK SURGERY: SHX140

## 2012-06-01 SURGERY — POSTERIOR LUMBAR FUSION 1 LEVEL
Anesthesia: General | Site: Back

## 2012-06-01 MED ORDER — MORPHINE SULFATE 4 MG/ML IJ SOLN
4.0000 mg | INTRAMUSCULAR | Status: DC | PRN
  Administered 2012-06-01: 4 mg via INTRAMUSCULAR
  Filled 2012-06-01: qty 1

## 2012-06-01 MED ORDER — LACTATED RINGERS IV SOLN
INTRAVENOUS | Status: DC | PRN
  Administered 2012-06-01 (×3): via INTRAVENOUS

## 2012-06-01 MED ORDER — VARENICLINE TARTRATE 0.5 MG PO TABS
0.5000 mg | ORAL_TABLET | Freq: Two times a day (BID) | ORAL | Status: DC
  Filled 2012-06-01 (×5): qty 1

## 2012-06-01 MED ORDER — ACETAMINOPHEN 10 MG/ML IV SOLN
1000.0000 mg | Freq: Four times a day (QID) | INTRAVENOUS | Status: AC
  Administered 2012-06-01 – 2012-06-02 (×4): 1000 mg via INTRAVENOUS
  Filled 2012-06-01 (×4): qty 100

## 2012-06-01 MED ORDER — LORATADINE 10 MG PO TABS
10.0000 mg | ORAL_TABLET | Freq: Every day | ORAL | Status: DC
  Administered 2012-06-02: 10 mg via ORAL
  Filled 2012-06-01 (×2): qty 1

## 2012-06-01 MED ORDER — ONDANSETRON HCL 4 MG/2ML IJ SOLN
INTRAMUSCULAR | Status: DC | PRN
  Administered 2012-06-01: 4 mg via INTRAVENOUS

## 2012-06-01 MED ORDER — FLUTICASONE PROPIONATE 50 MCG/ACT NA SUSP
2.0000 | Freq: Every day | NASAL | Status: DC
  Administered 2012-06-02: 2 via NASAL
  Filled 2012-06-01: qty 16

## 2012-06-01 MED ORDER — HYDROMORPHONE HCL PF 1 MG/ML IJ SOLN
0.2500 mg | INTRAMUSCULAR | Status: DC | PRN
  Administered 2012-06-01: 0.5 mg via INTRAVENOUS

## 2012-06-01 MED ORDER — ACETAMINOPHEN 325 MG PO TABS: 650.0000 mg | ORAL_TABLET | ORAL | Status: DC | PRN

## 2012-06-01 MED ORDER — 0.9 % SODIUM CHLORIDE (POUR BTL) OPTIME
TOPICAL | Status: DC | PRN
  Administered 2012-06-01 (×2): 1000 mL

## 2012-06-01 MED ORDER — KETOROLAC TROMETHAMINE 30 MG/ML IJ SOLN
30.0000 mg | Freq: Once | INTRAMUSCULAR | Status: AC
  Administered 2012-06-01: 30 mg via INTRAVENOUS

## 2012-06-01 MED ORDER — OXYCODONE-ACETAMINOPHEN 5-325 MG PO TABS
ORAL_TABLET | ORAL | Status: AC
  Filled 2012-06-01: qty 2

## 2012-06-01 MED ORDER — SODIUM CHLORIDE 0.9 % IR SOLN
Status: DC | PRN
  Administered 2012-06-01: 10:00:00

## 2012-06-01 MED ORDER — CLONAZEPAM 0.5 MG PO TABS
0.5000 mg | ORAL_TABLET | Freq: Every day | ORAL | Status: DC
  Administered 2012-06-01 – 2012-06-02 (×2): 0.5 mg via ORAL
  Filled 2012-06-01 (×3): qty 1

## 2012-06-01 MED ORDER — CYCLOBENZAPRINE HCL 10 MG PO TABS
10.0000 mg | ORAL_TABLET | Freq: Three times a day (TID) | ORAL | Status: DC | PRN
  Administered 2012-06-02 (×2): 10 mg via ORAL
  Filled 2012-06-01 (×2): qty 1

## 2012-06-01 MED ORDER — HYDROXYZINE HCL 50 MG/ML IM SOLN
50.0000 mg | INTRAMUSCULAR | Status: DC | PRN
  Administered 2012-06-02: 50 mg via INTRAMUSCULAR
  Filled 2012-06-01: qty 1

## 2012-06-01 MED ORDER — THROMBIN 20000 UNITS EX KIT
PACK | CUTANEOUS | Status: DC | PRN
  Administered 2012-06-01: 20000 [IU] via TOPICAL
  Administered 2012-06-01: 5000 [IU] via TOPICAL

## 2012-06-01 MED ORDER — PHENOL 1.4 % MT LIQD: 1.0000 | OROMUCOSAL | Status: DC | PRN

## 2012-06-01 MED ORDER — HEMOSTATIC AGENTS (NO CHARGE) OPTIME
TOPICAL | Status: DC | PRN
  Administered 2012-06-01: 1 via TOPICAL

## 2012-06-01 MED ORDER — HYDROXYZINE HCL 25 MG PO TABS: 50.0000 mg | ORAL_TABLET | ORAL | Status: DC | PRN

## 2012-06-01 MED ORDER — ACETAMINOPHEN 10 MG/ML IV SOLN: 1000.0000 mg | Freq: Once | INTRAVENOUS | Status: DC | PRN

## 2012-06-01 MED ORDER — SODIUM CHLORIDE 0.9 % IV SOLN: 250.0000 mL | INTRAVENOUS | Status: DC

## 2012-06-01 MED ORDER — OLOPATADINE HCL 0.1 % OP SOLN
1.0000 [drp] | Freq: Every day | OPHTHALMIC | Status: DC | PRN
  Filled 2012-06-01: qty 5

## 2012-06-01 MED ORDER — ACETAMINOPHEN 10 MG/ML IV SOLN
INTRAVENOUS | Status: AC
  Filled 2012-06-01: qty 100

## 2012-06-01 MED ORDER — SODIUM CHLORIDE 0.9 % IJ SOLN: 3.0000 mL | INTRAMUSCULAR | Status: DC | PRN

## 2012-06-01 MED ORDER — OXYCODONE HCL 5 MG PO TABS
5.0000 mg | ORAL_TABLET | ORAL | Status: DC | PRN
  Administered 2012-06-01 – 2012-06-03 (×6): 10 mg via ORAL
  Filled 2012-06-01 (×6): qty 2

## 2012-06-01 MED ORDER — ROCURONIUM BROMIDE 100 MG/10ML IV SOLN
INTRAVENOUS | Status: DC | PRN
  Administered 2012-06-01: 50 mg via INTRAVENOUS

## 2012-06-01 MED ORDER — MENTHOL 3 MG MT LOZG: 1.0000 | LOZENGE | OROMUCOSAL | Status: DC | PRN

## 2012-06-01 MED ORDER — HYDROMORPHONE HCL PF 1 MG/ML IJ SOLN
INTRAMUSCULAR | Status: AC
  Filled 2012-06-01: qty 1

## 2012-06-01 MED ORDER — MIDAZOLAM HCL 5 MG/5ML IJ SOLN
INTRAMUSCULAR | Status: DC | PRN
  Administered 2012-06-01: 2 mg via INTRAVENOUS

## 2012-06-01 MED ORDER — PHENYLEPHRINE HCL 10 MG/ML IJ SOLN
INTRAMUSCULAR | Status: DC | PRN
  Administered 2012-06-01 (×6): 40 ug via INTRAVENOUS
  Administered 2012-06-01 (×2): 80 ug via INTRAVENOUS

## 2012-06-01 MED ORDER — FENTANYL CITRATE 0.05 MG/ML IJ SOLN
INTRAMUSCULAR | Status: DC | PRN
  Administered 2012-06-01 (×2): 50 ug via INTRAVENOUS
  Administered 2012-06-01: 100 ug via INTRAVENOUS
  Administered 2012-06-01: 50 ug via INTRAVENOUS

## 2012-06-01 MED ORDER — KETOROLAC TROMETHAMINE 30 MG/ML IJ SOLN
INTRAMUSCULAR | Status: AC
  Filled 2012-06-01: qty 1

## 2012-06-01 MED ORDER — ATORVASTATIN CALCIUM 20 MG PO TABS
20.0000 mg | ORAL_TABLET | Freq: Every day | ORAL | Status: DC
  Administered 2012-06-02: 20 mg via ORAL
  Filled 2012-06-01 (×3): qty 1

## 2012-06-01 MED ORDER — OLOPATADINE HCL 0.2 % OP SOLN: 1.0000 [drp] | Freq: Every day | OPHTHALMIC | Status: DC | PRN

## 2012-06-01 MED ORDER — GLYCOPYRROLATE 0.2 MG/ML IJ SOLN
INTRAMUSCULAR | Status: DC | PRN
  Administered 2012-06-01: 0.2 mg via INTRAVENOUS

## 2012-06-01 MED ORDER — PANTOPRAZOLE SODIUM 40 MG PO TBEC
40.0000 mg | DELAYED_RELEASE_TABLET | Freq: Every day | ORAL | Status: DC
  Administered 2012-06-02: 40 mg via ORAL
  Filled 2012-06-01: qty 1

## 2012-06-01 MED ORDER — MAGNESIUM HYDROXIDE 400 MG/5ML PO SUSP: 30.0000 mL | Freq: Every day | ORAL | Status: DC | PRN

## 2012-06-01 MED ORDER — LIDOCAINE HCL (CARDIAC) 20 MG/ML IV SOLN
INTRAVENOUS | Status: DC | PRN
  Administered 2012-06-01: 40 mg via INTRAVENOUS

## 2012-06-01 MED ORDER — OXYCODONE-ACETAMINOPHEN 5-325 MG PO TABS
1.0000 | ORAL_TABLET | ORAL | Status: DC | PRN
  Administered 2012-06-01: 2 via ORAL

## 2012-06-01 MED ORDER — KCL IN DEXTROSE-NACL 40-5-0.45 MEQ/L-%-% IV SOLN
INTRAVENOUS | Status: DC
  Filled 2012-06-01 (×6): qty 1000

## 2012-06-01 MED ORDER — ACETAMINOPHEN 650 MG RE SUPP
650.0000 mg | RECTAL | Status: DC | PRN
Start: 2012-06-01 — End: 2012-06-03

## 2012-06-01 MED ORDER — KETOROLAC TROMETHAMINE 30 MG/ML IJ SOLN
30.0000 mg | Freq: Four times a day (QID) | INTRAMUSCULAR | Status: DC
  Administered 2012-06-01 – 2012-06-03 (×7): 30 mg via INTRAVENOUS
  Filled 2012-06-01 (×12): qty 1

## 2012-06-01 MED ORDER — BACITRACIN 50000 UNITS IM SOLR
INTRAMUSCULAR | Status: AC
  Filled 2012-06-01: qty 1

## 2012-06-01 MED ORDER — SODIUM CHLORIDE 0.9 % IJ SOLN
3.0000 mL | Freq: Two times a day (BID) | INTRAMUSCULAR | Status: DC
  Administered 2012-06-01 – 2012-06-02 (×3): 3 mL via INTRAVENOUS

## 2012-06-01 MED ORDER — SODIUM CHLORIDE 0.9 % IV SOLN
INTRAVENOUS | Status: AC
  Filled 2012-06-01: qty 500

## 2012-06-01 MED ORDER — BUPIVACAINE HCL (PF) 0.5 % IJ SOLN
INTRAMUSCULAR | Status: DC | PRN
  Administered 2012-06-01: 10 mL

## 2012-06-01 MED ORDER — FLUOXETINE HCL 20 MG PO CAPS
60.0000 mg | ORAL_CAPSULE | Freq: Every day | ORAL | Status: DC
  Administered 2012-06-01 – 2012-06-02 (×2): 60 mg via ORAL
  Filled 2012-06-01 (×3): qty 3

## 2012-06-01 MED ORDER — HYDROCODONE-ACETAMINOPHEN 5-325 MG PO TABS: 1.0000 | ORAL_TABLET | ORAL | Status: DC | PRN

## 2012-06-01 MED ORDER — BISACODYL 10 MG RE SUPP: 10.0000 mg | Freq: Every day | RECTAL | Status: DC | PRN

## 2012-06-01 MED ORDER — LIDOCAINE-EPINEPHRINE 1 %-1:100000 IJ SOLN
INTRAMUSCULAR | Status: DC | PRN
  Administered 2012-06-01: 10 mL via INTRADERMAL

## 2012-06-01 MED ORDER — PROPOFOL 10 MG/ML IV BOLUS
INTRAVENOUS | Status: DC | PRN
  Administered 2012-06-01: 150 mg via INTRAVENOUS

## 2012-06-01 MED ORDER — ZOLPIDEM TARTRATE 5 MG PO TABS: 5.0000 mg | ORAL_TABLET | Freq: Every evening | ORAL | Status: DC | PRN

## 2012-06-01 MED ORDER — ONDANSETRON HCL 4 MG/2ML IJ SOLN: 4.0000 mg | Freq: Once | INTRAMUSCULAR | Status: DC | PRN

## 2012-06-01 SURGICAL SUPPLY — 73 items
ADH SKN CLS APL DERMABOND .7 (GAUZE/BANDAGES/DRESSINGS) ×2
ADH SKN CLS LQ APL DERMABOND (GAUZE/BANDAGES/DRESSINGS) ×1
BAG DECANTER FOR FLEXI CONT (MISCELLANEOUS) ×2 IMPLANT
BLADE SURG ROTATE 9660 (MISCELLANEOUS) IMPLANT
BRUSH SCRUB EZ PLAIN DRY (MISCELLANEOUS) ×2 IMPLANT
BUR ACRON 5.0MM COATED (BURR) ×2 IMPLANT
BUR MATCHSTICK NEURO 3.0 LAGG (BURR) ×2 IMPLANT
CANISTER SUCTION 2500CC (MISCELLANEOUS) ×2 IMPLANT
CAP LCK SPNE (Orthopedic Implant) ×4 IMPLANT
CAP LOCK SPINE RADIUS (Orthopedic Implant) IMPLANT
CAP LOCKING (Orthopedic Implant) ×8 IMPLANT
CLOTH BEACON ORANGE TIMEOUT ST (SAFETY) ×2 IMPLANT
CONT SPEC 4OZ CLIKSEAL STRL BL (MISCELLANEOUS) ×2 IMPLANT
COVER BACK TABLE 24X17X13 BIG (DRAPES) IMPLANT
COVER TABLE BACK 60X90 (DRAPES) ×2 IMPLANT
DERMABOND ADHESIVE PROPEN (GAUZE/BANDAGES/DRESSINGS) ×1
DERMABOND ADVANCED (GAUZE/BANDAGES/DRESSINGS) ×2
DERMABOND ADVANCED .7 DNX12 (GAUZE/BANDAGES/DRESSINGS) ×2 IMPLANT
DERMABOND ADVANCED .7 DNX6 (GAUZE/BANDAGES/DRESSINGS) IMPLANT
DRAPE C-ARM 42X72 X-RAY (DRAPES) ×5 IMPLANT
DRAPE LAPAROTOMY 100X72X124 (DRAPES) ×2 IMPLANT
DRAPE POUCH INSTRU U-SHP 10X18 (DRAPES) ×2 IMPLANT
DRAPE PROXIMA HALF (DRAPES) ×2 IMPLANT
DRSG EMULSION OIL 3X3 NADH (GAUZE/BANDAGES/DRESSINGS) IMPLANT
ELECT REM PT RETURN 9FT ADLT (ELECTROSURGICAL) ×2
ELECTRODE REM PT RTRN 9FT ADLT (ELECTROSURGICAL) ×1 IMPLANT
GAUZE SPONGE 4X4 16PLY XRAY LF (GAUZE/BANDAGES/DRESSINGS) ×1 IMPLANT
GLOVE BIOGEL PI IND STRL 7.0 (GLOVE) ×1 IMPLANT
GLOVE BIOGEL PI IND STRL 8 (GLOVE) ×3 IMPLANT
GLOVE BIOGEL PI INDICATOR 7.0 (GLOVE) ×1
GLOVE BIOGEL PI INDICATOR 8 (GLOVE) ×3
GLOVE ECLIPSE 7.5 STRL STRAW (GLOVE) ×8 IMPLANT
GLOVE EXAM NITRILE LRG STRL (GLOVE) IMPLANT
GLOVE EXAM NITRILE MD LF STRL (GLOVE) ×1 IMPLANT
GLOVE EXAM NITRILE XL STR (GLOVE) IMPLANT
GLOVE EXAM NITRILE XS STR PU (GLOVE) IMPLANT
GLOVE SURG SS PI 6.5 STRL IVOR (GLOVE) ×2 IMPLANT
GOWN BRE IMP SLV AUR LG STRL (GOWN DISPOSABLE) ×2 IMPLANT
GOWN BRE IMP SLV AUR XL STRL (GOWN DISPOSABLE) ×4 IMPLANT
GOWN STRL REIN 2XL LVL4 (GOWN DISPOSABLE) ×2 IMPLANT
KIT BASIN OR (CUSTOM PROCEDURE TRAY) ×2 IMPLANT
KIT INFUSE X SMALL 1.4CC (Orthopedic Implant) ×2 IMPLANT
KIT ROOM TURNOVER OR (KITS) ×2 IMPLANT
MILL MEDIUM DISP (BLADE) ×4 IMPLANT
NDL SPNL 18GX3.5 QUINCKE PK (NEEDLE) IMPLANT
NEEDLE BONE MARROW 8GAX6 (NEEDLE) ×2 IMPLANT
NEEDLE SPNL 18GX3.5 QUINCKE PK (NEEDLE) ×2 IMPLANT
NEEDLE SPNL 22GX3.5 QUINCKE BK (NEEDLE) ×2 IMPLANT
NS IRRIG 1000ML POUR BTL (IV SOLUTION) ×2 IMPLANT
PACK LAMINECTOMY NEURO (CUSTOM PROCEDURE TRAY) ×2 IMPLANT
PAD ARMBOARD 7.5X6 YLW CONV (MISCELLANEOUS) ×6 IMPLANT
PATTIES SURGICAL .5 X.5 (GAUZE/BANDAGES/DRESSINGS) IMPLANT
PATTIES SURGICAL .5 X1 (DISPOSABLE) IMPLANT
PATTIES SURGICAL 1X1 (DISPOSABLE) IMPLANT
PEEK PLIF AVS 11X25X4 (Peek) ×2 IMPLANT
ROD RADIUS 35MM (Rod) ×2 IMPLANT
SCREW 5.75X40M (Screw) ×2 IMPLANT
SCREW 6.75X40MM (Screw) ×4 IMPLANT
SPONGE GAUZE 4X4 12PLY (GAUZE/BANDAGES/DRESSINGS) ×2 IMPLANT
SPONGE LAP 4X18 X RAY DECT (DISPOSABLE) IMPLANT
SPONGE NEURO XRAY DETECT 1X3 (DISPOSABLE) ×2 IMPLANT
SPONGE SURGIFOAM ABS GEL 100 (HEMOSTASIS) ×2 IMPLANT
STRIP BIOACTIVE VITOSS 25X100X (Neuro Prosthesis/Implant) ×2 IMPLANT
SUT VIC AB 1 CT1 18XBRD ANBCTR (SUTURE) ×2 IMPLANT
SUT VIC AB 1 CT1 8-18 (SUTURE) ×4
SUT VIC AB 2-0 CP2 18 (SUTURE) ×4 IMPLANT
SYR 20ML ECCENTRIC (SYRINGE) ×2 IMPLANT
SYR CONTROL 10ML LL (SYRINGE) ×4 IMPLANT
TAPE CLOTH SURG 4X10 WHT LF (GAUZE/BANDAGES/DRESSINGS) ×1 IMPLANT
TOWEL OR 17X24 6PK STRL BLUE (TOWEL DISPOSABLE) ×2 IMPLANT
TOWEL OR 17X26 10 PK STRL BLUE (TOWEL DISPOSABLE) ×2 IMPLANT
TRAY FOLEY CATH 14FRSI W/METER (CATHETERS) ×2 IMPLANT
WATER STERILE IRR 1000ML POUR (IV SOLUTION) ×2 IMPLANT

## 2012-06-01 NOTE — Anesthesia Preprocedure Evaluation (Signed)
Anesthesia Evaluation  Patient identified by MRN, date of birth, ID band Patient awake    Reviewed: Allergy & Precautions, H&P , NPO status , Patient's Chart, lab work & pertinent test results  Airway Mallampati: II      Dental  (+) Teeth Intact and Dental Advisory Given   Pulmonary  breath sounds clear to auscultation        Cardiovascular Rhythm:Regular Rate:Normal     Neuro/Psych    GI/Hepatic   Endo/Other    Renal/GU      Musculoskeletal   Abdominal   Peds  Hematology   Anesthesia Other Findings   Reproductive/Obstetrics                           Anesthesia Physical Anesthesia Plan  ASA: III  Anesthesia Plan: General   Post-op Pain Management:    Induction: Intravenous  Airway Management Planned: Oral ETT  Additional Equipment:   Intra-op Plan:   Post-operative Plan: Extubation in OR  Informed Consent: I have reviewed the patients History and Physical, chart, labs and discussed the procedure including the risks, benefits and alternatives for the proposed anesthesia with the patient or authorized representative who has indicated his/her understanding and acceptance.   Dental advisory given  Plan Discussed with: CRNA and Surgeon  Anesthesia Plan Comments: (Lumbar Spondylosis GERD S/P Carotid R. CEA  Plan GA with oral ETT  Kipp Brood, MD)        Anesthesia Quick Evaluation

## 2012-06-01 NOTE — Preoperative (Signed)
Beta Blockers   Reason not to administer Beta Blockers:Not Applicable 

## 2012-06-01 NOTE — Op Note (Signed)
[place] and 06/01/2012  1:05 PM  PATIENT:  Norma Scott  61 y.o. female  PRE-OPERATIVE DIAGNOSIS:  Bilateral L5 pars interarticularis defect, grade 1 dynamic L5-S1 spondylolisthesis, lumbar stenosis, lumbar spondylosis  POST-OPERATIVE DIAGNOSIS:  Bilateral L5 pars interarticularis defect, grade 1 dynamic L5-S1 spondylolisthesis, lumbar stenosis, lumbar spondylosis  PROCEDURE:  Procedure(s): POSTERIOR LUMBAR FUSION 1 LEVEL: L5 Gill procedure, the bilateral L5-S1 foraminotomies, decompression of the thecal sac and exiting L5 and S1 nerve roots bilaterally, with decompression beyond that required for interbody arthrodesis, with microdissection microsurgical technique, bilateral L5-S1 posterior lumbar interbody arthrodesis with AVS peek interbody implants and locally harvested morcellized autograft and infuse, and bilateral L5-S1 posterior lateral arthrodesis with radius posterior instrumentation, Vitoss BA with bone marrow aspirate, and infuse  SURGEON:  Surgeon(s): Hewitt Shorts, MD Tia Alert, MD  ASSISTANTS: Marikay Alar, M.D.  ANESTHESIA:   general  EBL:  Total I/O In: 2400 [I.V.:2300; Blood:100] Out: 650 [Urine:350; Blood:300]  BLOOD ADMINISTERED:100 CC CELLSAVER  COUNT: Correct per nursing staff  DICTATION: Patient is brought to the operating room placed under general endotracheal anesthesia. The patient was turned to prone position the lumbar region was prepped with Betadine soap and solution and draped in a sterile fashion. The midline was infiltrated with local anesthesia with epinephrine. A midline incision was made carried down through the subcutaneous tissue, bipolar cautery and electrocautery were used to maintain hemostasis. Dissection was carried down to the lumbar fascia. The fascia was incised bilaterally and the paraspinal muscles were dissected with a spinous process and lamina in a subperiosteal fashion. A x-ray was taken for localization and the L5-S1 level was  localized. Dissection was then carried out laterally over the facet complex and the transverse processes of L5, and the ala of S1 were exposed and decorticated. Decompression was done by performing a Gill procedure, removing the spinous process, lamina process and inferior articular processes of L5 en bloc. This bone was cleaned of soft tissue, and morselized using a bone mill. The local harvested autograft was later use as bone graft material for the fusion portion of the procedure. Dissection was carried out laterally including facetectomy of the superior articular process of S1 bilaterally and foraminotomies with decompression of the stenotic compression of the L5 and S1 nerve roots bilaterally. Once the decompression of the stenotic compression of the thecal sac and exiting nerve roots was completed we proceeded with the posterior lumbar interbody arthrodesis. The annulus was incised bilaterally and the disc space entered. A thorough discectomy was performed using pituitary rongeurs and curettes. Once the discectomy was completed we began to prepare the endplate surfaces removing the cartilaginous endplates surface with paddle curettes. We then measured the height of the intervertebral disc space. We selected 11 x 25 x 4 AVS peek interbody implants.  The C-arm fluoroscope was then draped and brought in the field and we identified the pedicle entry points bilaterally at the L5 and S1 levels. Each of the 4 pedicles was probed, we aspirated bone marrow aspirate from the vertebral bodies, this was injected over a 10 cc strip of Vitoss BA. Then each of the pedicles was examined with the ball probe good bony surfaces were found and no bony cuts were found. Each of the L5 pedicles was then tapped with a 5.25 mm tap, again examined with the ball probe good threading was found and no bony cuts were found. We then placed 5.75 by 40 millimeter screws bilaterally at the L5 level. Then each of the S1  pedicles was tapped  with a 6.25 mm tap, again examined with the ball probe, good threading was found, and no bony cuts were found. We then placed 6.75 x 40 mm screws bilaterally at the S1 level.  We then packed the AVS peek interbody implants with locally harvested morcellized autograft and infuse, and then placed the first implant and on the right side, carefully retracting the thecal sac and nerve root medially. We then went back to the left side and packed the midline with additional locally harvested morcellized autograft and then placed a second implant and on the left side again retracting the thecal sac and nerve root medially. Additional locally harvested morcellized autograft was packed lateral to the implants.  We then packed the lateral gutter over the transverse processes and intertransverse space with Vitoss BA with bone marrow aspirate and infuse. We then selected a 35 mm pre-lordosed rods, they were placed within the screw heads and secured with locking caps once all 4 locking caps were placed final tightening was performed against a counter torque.  The wound had been irrigated multiple times during the procedure with saline solution and bacitracin solution, good hemostasis was established with a combination of bipolar cautery and Gelfoam with thrombin. Once good hemostasis was confirmed we proceeded with closure paraspinal muscles deep fascia and Scarpa's fascia were closed with interrupted undyed 1 Vicryl sutures the subcutaneous and subcuticular closed with interrupted inverted 2-0 undyed Vicryl sutures the skin edges were approximated with Dermabond. We then applied a dressing of sterile gauze and Hypafix.  Following surgery the patient was turned back to the supine position to be reversed and the anesthetic extubated and transferred to the recovery room for further care.  PATIENT DISPOSITION:  PACU - hemodynamically stable.   Delay start of Pharmacological VTE agent (>24hrs) due to surgical blood loss  or risk of bleeding:  yes

## 2012-06-01 NOTE — Transfer of Care (Signed)
Immediate Anesthesia Transfer of Care Note  Patient: Norma Scott  Procedure(s) Performed: Procedure(s) (LRB) with comments: POSTERIOR LUMBAR FUSION 1 LEVEL (N/A) - Lumbar five gill procedure with Lumbar five sacral one posterior lumbar interbody fusion with interbody prothesis posterolateral arthrodesis and posterior nonsegmental instrumentation  Patient Location: PACU  Anesthesia Type:General  Level of Consciousness: awake, alert  and oriented  Airway & Oxygen Therapy: Patient Spontanous Breathing and Patient connected to nasal cannula oxygen  Post-op Assessment: Report given to PACU RN, Post -op Vital signs reviewed and stable and Patient moving all extremities  Post vital signs: Reviewed and stable  Complications: No apparent anesthesia complications

## 2012-06-01 NOTE — Anesthesia Procedure Notes (Signed)
Procedure Name: Intubation Date/Time: 06/01/2012 8:30 AM Performed by: Rossie Muskrat L Pre-anesthesia Checklist: Patient identified, Timeout performed, Emergency Drugs available, Suction available and Patient being monitored Patient Re-evaluated:Patient Re-evaluated prior to inductionOxygen Delivery Method: Circle system utilized Preoxygenation: Pre-oxygenation with 100% oxygen Intubation Type: IV induction Laryngoscope Size: Miller and 2 Grade View: Grade I Tube type: Oral Number of attempts: 1 Airway Equipment and Method: Stylet Placement Confirmation: ETT inserted through vocal cords under direct vision,  breath sounds checked- equal and bilateral and positive ETCO2 Secured at: 21 cm Tube secured with: Tape Dental Injury: Teeth and Oropharynx as per pre-operative assessment

## 2012-06-01 NOTE — H&P (Signed)
Subjective: Patient is a 61 y.o. 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  Symptomatically she has low back and bilateral lumbar radicular pain.  Patient is admitted for an L5-S1 lumbar decompression, posterior lumbar interbody arthrodesis, and posterior lateral arthrodesis.   Patient Active Problem List   Diagnosis Date Noted  . Occlusion and stenosis of carotid artery without mention of cerebral infarction 05/16/2012  . Peripheral vascular disease, unspecified 05/16/2012  . PAD (peripheral artery disease) 02/08/2012   Past Medical History  Diagnosis Date  . Carotid artery occlusion   . Fibromyalgia   . Esophageal reflux   . Asthma   . Hyperlipidemia   . Gall stones   . Cervical disc displacement     x2  . Lumbar disc herniation     L5-S1  . Allergy     allergic rhinitis  . COPD (chronic obstructive pulmonary disease)   . Generalized anxiety disorder   . CAD (coronary artery disease)     patient says no problems   dr Williemae Natter [place]    Past Surgical History  Procedure Date  . Carotid endarterectomy 02/01/2009    Right  . Spine surgery     cervical fusion x 2  . Tubal ligation   . Cervical fusion     1995, 1996  . Ectopic pregnancy surgery 93    Prescriptions prior to admission  Medication Sig Dispense Refill  . alendronate (FOSAMAX) 70 MG tablet Take 70 mg by mouth every 7 (seven) days. Take with a full glass of water on an empty stomach. Take on mondays      . amoxicillin-clavulanate (AUGMENTIN XR) 1000-62.5 MG per tablet Take 2 tablets by mouth 2 (two) times daily.      Marland Kitchen aspirin 81 MG tablet Take 81 mg by mouth daily.      . cetirizine (ZYRTEC) 10 MG tablet Take 10 mg by mouth daily.      . clonazePAM (KLONOPIN) 0.5 MG tablet Take 0.5 mg by mouth daily.      . diclofenac (VOLTAREN) 75  MG EC tablet Take 75 mg by mouth at bedtime.       Marland Kitchen FLUoxetine (PROZAC) 20 MG capsule Take 60 mg by mouth daily.       . fluticasone (FLONASE) 50 MCG/ACT nasal spray Place 2 sprays into the nose daily.      . Olopatadine HCl (PATADAY) 0.2 % SOLN Place 1 drop into both eyes daily as needed. For allergies      . Omega-3 Fatty Acids (FISH OIL TRIPLE STRENGTH) 1400 MG CAPS Take 1,400 mg by mouth daily.      Marland Kitchen omeprazole (PRILOSEC) 20 MG capsule Take 20 mg by mouth 2 (two) times daily as needed. For reflux      . rosuvastatin (CRESTOR) 10 MG tablet Take 10 mg by mouth daily.      . varenicline (CHANTIX) 0.5 MG tablet Take 0.5 mg by mouth 2 (two) times daily.       Allergies  Allergen Reactions  . Wellbutrin (Bupropion) Other (See Comments)    Hallucinations  . Septra (Sulfamethoxazole-Tmp Ds) Nausea And Vomiting    Vomiting  . Sulfa Antibiotics Nausea And Vomiting  . Zithromax (Azithromycin) Hives  . Cymbalta (Duloxetine Hcl)     fatigue    History  Substance Use Topics  . Smoking status:  Current Every Day Smoker -- 0.5 packs/day for 45 years    Types: Cigarettes  . Smokeless tobacco: Never Used     Comment: pt states that she is taking chantix trying to quit occ alcohol  . Alcohol Use: Yes     Comment: Occasional    Family History  Problem Relation Age of Onset  . Stroke Mother   . Hypertension Mother   . Hyperlipidemia Mother   . Diabetes Father   . Heart disease Paternal Uncle   . Stroke Maternal Grandmother   . Cancer Maternal Grandfather     stomach cancer  . Heart disease Paternal Grandfather      Review of Systems A comprehensive review of systems was negative.  Objective: Vital signs in last 24 hours: Temp:  [98.1 F (36.7 C)] 98.1 F (36.7 C) (11/06 0649) Pulse Rate:  [76] 76  (11/06 0649) Resp:  [18] 18  (11/06 0649) BP: (129)/(82) 129/82 mmHg (11/06 0649) SpO2:  [95 %] 95 % (11/06 0649)  EXAM:  patient is a well-developed well-nourished white female in no  acute distress.  Lungs are clear to auscultation , the patient has symmetrical respiratory excursion. Heart has a regular rate and rhythm normal S1 and S2 no murmur.   Abdomen is soft nontender nondistended bowel sounds are present. Extremity examination shows no clubbing cyanosis or edema. Musculoskeletal examination shows mild to palpation in the lumbar region without specific point tenderness. She is able to flex beyond 90, she is able to extend to 10. Straight leg raising is negative bilaterally.  Motor examination shows 5 over 5 strength in the lower extremities including the iliopsoas quadriceps dorsiflexor extensor hallicus  longus and plantar flexor bilaterally. Sensation is intact to pinprick in the distal lower extremities. Reflexes are symmetrical bilaterally. No pathologic reflexes are present. Patient has a normal gait and stance.   Data Review:CBC    Component Value Date/Time   WBC 8.2 05/24/2012 1030   RBC 4.93 05/24/2012 1030   HGB 14.2 05/24/2012 1030   HCT 43.2 05/24/2012 1030   PLT 237 05/24/2012 1030   MCV 87.6 05/24/2012 1030   MCH 28.8 05/24/2012 1030   MCHC 32.9 05/24/2012 1030   RDW 12.5 05/24/2012 1030                          BMET    Component Value Date/Time   NA 139 05/24/2012 1030   K 3.0* 05/24/2012 1030   CL 102 05/24/2012 1030   CO2 30 05/24/2012 1030   GLUCOSE 63* 05/24/2012 1030   BUN 5* 05/24/2012 1030   CREATININE 0.59 05/24/2012 1030   CALCIUM 9.2 05/24/2012 1030   GFRNONAA >90 05/24/2012 1030   GFRAA >90 05/24/2012 1030     Assessment/Plan: Patient with advanced degenerative changes at the L5-S1 level with steadily worsening low back and left lumbar radicular pain, who is admitted for lumbar decompression and arthrodesis.  I've discussed with the patient the nature of his condition, the nature the surgical procedure, the typical length of surgery, hospital stay, and overall recuperation, the limitations postoperatively, and risks of surgery.  I discussed risks including risks of infection, bleeding, possibly need for transfusion, the risk of nerve root dysfunction with pain, weakness, numbness, or paresthesias, the risk of dural tear and CSF leakage and possible need for further surgery, the risk of failure of the arthrodesis and possibly for further surgery, the risk of anesthetic complications including myocardial infarction, stroke,  pneumonia, and death. We discussed the need for postoperative immobilization in a lumbar brace. Understanding all this the patient does wish to proceed with surgery and is admitted for such.     Hewitt Shorts, MD 06/01/2012 7:26 AM

## 2012-06-01 NOTE — Anesthesia Postprocedure Evaluation (Signed)
  Anesthesia Post-op Note  Patient: Norma Scott  Procedure(s) Performed: Procedure(s) (LRB) with comments: POSTERIOR LUMBAR FUSION 1 LEVEL (N/A) - Lumbar five gill procedure with Lumbar five sacral one posterior lumbar interbody fusion with interbody prothesis posterolateral arthrodesis and posterior nonsegmental instrumentation  Patient Location: PACU  Anesthesia Type:General  Level of Consciousness: awake, alert  and oriented  Airway and Oxygen Therapy: Patient Spontanous Breathing  Post-op Pain: mild  Post-op Assessment: Post-op Vital signs reviewed and Patient's Cardiovascular Status Stable  Post-op Vital Signs: stable  Complications: No apparent anesthesia complications

## 2012-06-02 MED ORDER — ACETAMINOPHEN 10 MG/ML IV SOLN
1000.0000 mg | Freq: Four times a day (QID) | INTRAVENOUS | Status: AC
  Administered 2012-06-02 – 2012-06-03 (×4): 1000 mg via INTRAVENOUS
  Filled 2012-06-02 (×5): qty 100

## 2012-06-02 NOTE — Progress Notes (Signed)
Filed Vitals:   06/01/12 2000 06/01/12 2015 06/01/12 2335 06/02/12 0400  BP:  98/53 92/44 99/56   Pulse: 53 55 57 61  Temp: 98.4 F (36.9 C)  98.7 F (37.1 C) 99.1 F (37.3 C)  TempSrc:      Resp: 16  16 16   SpO2: 96%  98% 97%    Patient doing well following surgery, ambulated yesterday as well as again this morning. Some incisional as well as some residual radicular discomfort. Dressing clean and dry. Foley discontinued this morning, has not yet voided. Nursing staff to monitor voiding function. Encouraged to ambulate at least 4-5 times today in the halls.  Plan: Continued to progress to postoperative recovery.  Hewitt Shorts, MD 06/02/2012, 6:57 AM

## 2012-06-03 MED ORDER — OXYCODONE-ACETAMINOPHEN 5-325 MG PO TABS: 1.0000 | ORAL_TABLET | ORAL | Status: DC | PRN

## 2012-06-03 MED ORDER — CYCLOBENZAPRINE HCL 10 MG PO TABS: 5.0000 mg | ORAL_TABLET | Freq: Three times a day (TID) | ORAL | Status: DC | PRN

## 2012-06-03 MED FILL — Sodium Chloride IV Soln 0.9%: INTRAVENOUS | Qty: 1000 | Status: AC

## 2012-06-03 MED FILL — Heparin Sodium (Porcine) Inj 1000 Unit/ML: INTRAMUSCULAR | Qty: 30 | Status: AC

## 2012-06-03 MED FILL — Sodium Chloride Irrigation Soln 0.9%: Qty: 3000 | Status: AC

## 2012-06-03 NOTE — Progress Notes (Signed)
Utilization review completed. Tocara Mennen, RN, BSN. 

## 2012-06-03 NOTE — Discharge Summary (Signed)
Physician Discharge Summary  Patient ID: Norma Scott MRN: [id number] DOB/AGE: Aug 30, 1950 61 y.o.  Admit date: 06/01/2012 Discharge date: 06/03/2012  Admission Diagnoses:  Bilateral L5 pars interarticularis defect, grade 1 dynamic L5-S1 spondylolisthesis, lumbar neural foraminal stenosis, lumbar spondylosis  Discharge Diagnoses:  Bilateral L5 pars interarticularis defect, grade 1 dynamic L5-S1 spondylolisthesis, lumbar neural foraminal stenosis, lumbar spondylosis  Discharged Condition: good  Hospital Course: Patient was admitted underwent an L5 Gill procedure, a bilateral L5-S1 PLIF, and a bilateral L5-S1 PLA. She is done well following surgery, up and in doing actively in the halls. She's had some mild nausea, probably from the narcotic medications and limited by mouth intake. Her wound is healing well. She is being discharged to home with instructions regarding wound care and activities. She is to return for followup with me in 3 weeks.  Discharge Exam: Blood pressure 94/63, pulse 70, temperature 98 F (36.7 C), temperature source Oral, resp. rate 14, SpO2 98.00%.  Disposition: Home  Discharge Orders    Future Appointments: Provider: Department: Dept Phone: Center:   11/14/2012 4:00 PM Nada Libman, MD [facility] [phone] [facility]   05/15/2013 3:00 PM [facility] [phone] [facility]   05/15/2013 4:00 PM Nada Libman, MD [facility] [phone] [facility]       Medication List     As of 06/03/2012  7:56 AM    TAKE these medications         alendronate 70 MG tablet   Commonly known as: FOSAMAX   Take 70 mg by mouth every 7 (seven) days. Take with a full glass of water on an empty stomach. Take on mondays      amoxicillin-clavulanate 1000-62.5 MG per tablet   Commonly known as: AUGMENTIN XR   Take 2 tablets by mouth 2 (two) times daily.      aspirin 81 MG tablet   Take 81  mg by mouth daily.      cetirizine 10 MG tablet   Commonly known as: ZYRTEC   Take 10 mg by mouth daily.      clonazePAM 0.5 MG tablet   Commonly known as: KLONOPIN   Take 0.5 mg by mouth daily.      cyclobenzaprine 10 MG tablet   Commonly known as: FLEXERIL   Take 0.5-1 tablets (5-10 mg total) by mouth 3 (three) times daily as needed for muscle spasms.      diclofenac 75 MG EC tablet   Commonly known as: VOLTAREN   Take 75 mg by mouth at bedtime.      FISH OIL TRIPLE STRENGTH 1400 MG Caps   Take 1,400 mg by mouth daily.      FLUoxetine 20 MG capsule   Commonly known as: PROZAC   Take 60 mg by mouth daily.      fluticasone 50 MCG/ACT nasal spray   Commonly known as: FLONASE   Place 2 sprays into the nose daily.      omeprazole 20 MG capsule   Commonly known as: PRILOSEC   Take 20 mg by mouth 2 (two) times daily as needed. For reflux      oxyCODONE-acetaminophen 5-325 MG per tablet   Commonly known as: PERCOCET/ROXICET   Take 1-2 tablets by mouth every 4 (four) hours as needed for pain.      PATADAY 0.2 % Soln   Generic drug: Olopatadine HCl   Place 1 drop into both eyes daily as  needed. For allergies      rosuvastatin 10 MG tablet   Commonly known as: CRESTOR   Take 10 mg by mouth daily.      varenicline 0.5 MG tablet   Commonly known as: CHANTIX   Take 0.5 mg by mouth 2 (two) times daily.         Signed: Hewitt Shorts, MD 06/03/2012, 7:56 AM

## 2012-11-14 ENCOUNTER — Ambulatory Visit: Payer: 59 | Admitting: Surgery

## 2012-11-18 ENCOUNTER — Encounter: Payer: Self-pay | Admitting: Surgery

## 2012-11-21 ENCOUNTER — Encounter: Payer: Self-pay | Admitting: Surgery

## 2012-11-21 ENCOUNTER — Ambulatory Visit (INDEPENDENT_AMBULATORY_CARE_PROVIDER_SITE_OTHER): Payer: 59 | Admitting: Surgery

## 2012-11-21 VITALS — BP 142/72 | HR 79 | Ht 60.0 in | Wt 174.0 lb

## 2012-11-21 DIAGNOSIS — I739 Peripheral vascular disease, unspecified: Secondary | ICD-10-CM

## 2012-11-21 NOTE — Progress Notes (Signed)
[facility] [place]   Patient name: Norma Scott MRN: [id number] DOB: 1950/12/20 Sex: female     Chief Complaint  Patient presents with  . Re-evaluation    6 month f/u s/p back surgery - to see if LE pain has resolved     HISTORY OF PRESENT ILLNESS: The patient comes back for followup of 2 problems: Carotid stenosis, and lower extremity vascular disease. She is status post right carotid endarterectomy in July of 2010 for asymptomatic stenosis. She is currently being followed via ultrasound. She denies any neurologic symptoms today. I am also following her for left lower extremity vascular occlusive disease. She complains of pain in her legs with activity. She recently underwent back surgery. I have all along felt that the majority of her symptoms were related to nerve compression. She did have some relief with the operation but still continues to complain of discomfort. She describes a burning pain which begins in her back and goes down back side of her leg. This happens with activity. It also happens when she stands up. She does not complain of significant cramping with activity.  Past Medical History  Diagnosis Date  . Carotid artery occlusion   . Fibromyalgia   . Esophageal reflux   . Asthma   . Hyperlipidemia   . Gall stones   . Cervical disc displacement     x2  . Lumbar disc herniation     L5-S1  . Allergy     allergic rhinitis  . COPD (chronic obstructive pulmonary disease)   . Generalized anxiety disorder   . CAD (coronary artery disease)     patient says no problems   dr Williemae Natter [place]    Past Surgical History  Procedure Laterality Date  . Carotid endarterectomy  02/01/2009    Right  . Spine surgery      cervical fusion x 2  . Tubal ligation    . Cervical fusion      1995, 1996  . Ectopic pregnancy surgery  93  . Back surgery  06/01/2012    History   Social History  . Marital Status: Legally Separated    Spouse Name: N/A   Number of Children: N/A  . Years of Education: N/A   Occupational History  . Not on file.   Social History Main Topics  . Smoking status: Current Every Day Smoker -- 0.20 packs/day for 45 years    Types: Cigarettes  . Smokeless tobacco: Never Used     Comment: pt states she only smokes about 2-3 cigs per day  . Alcohol Use: Yes     Comment: Occasional  . Drug Use: No  . Sexually Active: Not on file   Other Topics Concern  . Not on file   Social History Narrative  . No narrative on file    Family History  Problem Relation Age of Onset  . Stroke Mother   . Hypertension Mother   . Hyperlipidemia Mother   . Diabetes Father   . Heart disease Paternal Uncle   . Stroke Maternal Grandmother   . Cancer Maternal Grandfather     stomach cancer  . Heart disease Paternal Grandfather     Allergies as of 11/21/2012 - Review Complete 11/21/2012  Allergen Reaction Noted  . Wellbutrin (bupropion) Other (See Comments) 01/11/2012  . Septra (sulfamethoxazole-tmp ds) Nausea And Vomiting 01/11/2012  . Sulfa antibiotics Nausea And Vomiting 01/11/2012  . Zithromax (azithromycin) Hives 01/11/2012  . Cymbalta (duloxetine hcl)  01/11/2012    Current Outpatient Prescriptions on File Prior to Visit  Medication Sig Dispense Refill  . alendronate (FOSAMAX) 70 MG tablet Take 70 mg by mouth every 7 (seven) days. Take with a full glass of water on an empty stomach. Take on mondays      . aspirin 81 MG tablet Take 81 mg by mouth daily.      . cetirizine (ZYRTEC) 10 MG tablet Take 10 mg by mouth daily.      . clonazePAM (KLONOPIN) 0.5 MG tablet Take 0.5 mg by mouth daily.      . cyclobenzaprine (FLEXERIL) 10 MG tablet Take 0.5-1 tablets (5-10 mg total) by mouth 3 (three) times daily as needed for muscle spasms.  50 tablet  1  . diclofenac (VOLTAREN) 75 MG EC tablet Take 75 mg by mouth at bedtime.       Marland Kitchen FLUoxetine (PROZAC) 20 MG capsule Take 60 mg by mouth daily.       . fluticasone (FLONASE) 50  MCG/ACT nasal spray Place 2 sprays into the nose daily.      . Olopatadine HCl (PATADAY) 0.2 % SOLN Place 1 drop into both eyes daily as needed. For allergies      . Omega-3 Fatty Acids (FISH OIL TRIPLE STRENGTH) 1400 MG CAPS Take 1,000 mg by mouth daily.       Marland Kitchen omeprazole (PRILOSEC) 20 MG capsule Take 20 mg by mouth 2 (two) times daily as needed. For reflux      . rosuvastatin (CRESTOR) 10 MG tablet Take 10 mg by mouth daily.      . varenicline (CHANTIX) 0.5 MG tablet Take 0.5 mg by mouth 2 (two) times daily.      Marland Kitchen amoxicillin-clavulanate (AUGMENTIN XR) 1000-62.5 MG per tablet Take 2 tablets by mouth 2 (two) times daily.      Marland Kitchen oxyCODONE-acetaminophen (PERCOCET/ROXICET) 5-325 MG per tablet Take 1-2 tablets by mouth every 4 (four) hours as needed for pain.  75 tablet  0   No current facility-administered medications on file prior to visit.     REVIEW OF SYSTEMS: Positive for pain in legs with walking, weakness in her legs and numbness in her legs. All of systems are negative  PHYSICAL EXAMINATION:   Vital signs are BP 142/72  Pulse 79  Ht 5' (1.524 m)  Wt 174 lb (78.926 kg)  BMI 33.98 kg/m2  SpO2 100% General: The patient appears their stated age. HEENT:  No gross abnormalities Pulmonary:  Non labored breathing Musculoskeletal: There are no major deformities. Neurologic: No focal weakness or paresthesias are detected, Skin: There are no ulcer or rashes noted. Psychiatric: The patient has normal affect. Cardiovascular: There is a regular rate and rhythm without significant murmur appreciated. No carotid bruits.   Diagnostic Studies None today  Assessment: Carotid occlusive disease Lower extremity vascular occlusive disease Plan: #1: The patient is scheduled for a follow up carotid duplex in 6 months. #2: I will see the patient back in 6 months. I will repeat her ABIs at that time. I have discussed with the patient that I continue to feel that her symptoms are not related to  her vascular disease. She continues to complain of nerve like symptoms with pain shooting down from her back to her foot. I would not recommend intervention at this time for her decreased ankle brachial index in the left leg. We'll continue to monitor this for now.  Jorge Ny, M.D. [facility] [place] Office:  [phone] Pager:  [phone]

## 2012-11-23 ENCOUNTER — Other Ambulatory Visit: Payer: Self-pay | Admitting: *Deleted

## 2012-11-23 DIAGNOSIS — I739 Peripheral vascular disease, unspecified: Secondary | ICD-10-CM

## 2013-03-13 DIAGNOSIS — Z0279 Encounter for issue of other medical certificate: Secondary | ICD-10-CM

## 2013-05-12 ENCOUNTER — Encounter: Payer: Self-pay | Admitting: Family

## 2013-05-15 ENCOUNTER — Ambulatory Visit (HOSPITAL_COMMUNITY)
Admission: RE | Admit: 2013-05-15 | Discharge: 2013-05-15 | Disposition: A | Discharge status: Home / Self Care | Payer: 59 | Source: Ambulatory Visit | Attending: Surgery | Admitting: Surgery

## 2013-05-15 ENCOUNTER — Ambulatory Visit (INDEPENDENT_AMBULATORY_CARE_PROVIDER_SITE_OTHER)
Admission: RE | Admit: 2013-05-15 | Discharge: 2013-05-15 | Disposition: A | Discharge status: Home / Self Care | Payer: 59 | Source: Ambulatory Visit | Attending: Surgery | Admitting: Surgery

## 2013-05-15 ENCOUNTER — Encounter: Payer: Self-pay | Admitting: Family

## 2013-05-15 ENCOUNTER — Encounter (INDEPENDENT_AMBULATORY_CARE_PROVIDER_SITE_OTHER): Payer: Self-pay

## 2013-05-15 ENCOUNTER — Ambulatory Visit (INDEPENDENT_AMBULATORY_CARE_PROVIDER_SITE_OTHER): Payer: 59 | Admitting: Family

## 2013-05-15 DIAGNOSIS — I6529 Occlusion and stenosis of unspecified carotid artery: Secondary | ICD-10-CM

## 2013-05-15 DIAGNOSIS — I739 Peripheral vascular disease, unspecified: Secondary | ICD-10-CM

## 2013-05-15 DIAGNOSIS — M79609 Pain in unspecified limb: Secondary | ICD-10-CM

## 2013-05-15 DIAGNOSIS — Z48812 Encounter for surgical aftercare following surgery on the circulatory system: Secondary | ICD-10-CM | POA: Insufficient documentation

## 2013-05-15 NOTE — Progress Notes (Signed)
[facility] [place] HISTORY AND PHYSICAL   MRN : [id number]  History of Present Illness:   Norma Scott is a 62 y.o. female patient of Dr. Myra Gianotti who returns for followup of 2 problems: Carotid stenosis, and lower extremity vascular disease. She is status post right carotid endarterectomy in July of 2010 for asymptomatic stenosis. Had lumbar spine surgery Nov., 2013, states her right foot is numb often, but her legs feel stronger since the back surgery and rarely falls compared to before her back surgery when she had frequent falls. Has throbbing in left thigh and calf after walking 1/4 of a block or less, relieved by rest, no claudication in right leg with walking. Cough for the last 1-2 weeks, her grandson has the same; states the change of season causes sneezing.   Patient denies non healing ulcers on lower extremities but it did take a long time to heal for what seemed like a bug bite on left medial malleolus. Patient has Negative history of TIA or stroke symptom.  The patient denies amaurosis fugax or monocular blindness.  The patient  denies facial drooping.  Pt. denies hemiplegia.  The patient denies receptive or expressive aphasia.    Pt Diabetic: No Pt smoker: smoker  (1-2 cigarettes/day x 40+ yrs)  Current Outpatient Prescriptions  Medication Sig Dispense Refill  . cetirizine (ZYRTEC) 10 MG tablet Take 10 mg by mouth daily.      . clonazePAM (KLONOPIN) 0.5 MG tablet Take 0.5 mg by mouth daily.      . cyclobenzaprine (FLEXERIL) 10 MG tablet Take 0.5-1 tablets (5-10 mg total) by mouth 3 (three) times daily as needed for muscle spasms.  50 tablet  1  . diclofenac (VOLTAREN) 75 MG EC tablet Take 75 mg by mouth at bedtime.       Marland Kitchen FLUoxetine (PROZAC) 20 MG capsule Take 60 mg by mouth daily.       . fluticasone (FLONASE) 50 MCG/ACT nasal spray Place 2 sprays into the nose daily.      Marland Kitchen gabapentin (NEURONTIN) 100 MG capsule Take 100 mg by mouth 4 (four)  times daily.      Marland Kitchen omeprazole (PRILOSEC) 20 MG capsule Take 20 mg by mouth 2 (two) times daily as needed. For reflux      . rosuvastatin (CRESTOR) 10 MG tablet Take 10 mg by mouth daily.      Marland Kitchen alendronate (FOSAMAX) 70 MG tablet Take 70 mg by mouth every 7 (seven) days. Take with a full glass of water on an empty stomach. Take on mondays      . amoxicillin-clavulanate (AUGMENTIN XR) 1000-62.5 MG per tablet Take 2 tablets by mouth 2 (two) times daily.      Marland Kitchen aspirin 81 MG tablet Take 81 mg by mouth daily.      Marland Kitchen BIOTIN PO Take 10,000 mcg by mouth daily.      . Multiple Vitamins-Minerals (WOMENS MULTI VITAMIN & MINERAL) TABS Take 1 tablet by mouth daily.      . Olopatadine HCl (PATADAY) 0.2 % SOLN Place 1 drop into both eyes daily as needed. For allergies      . Omega-3 Fatty Acids (FISH OIL TRIPLE STRENGTH) 1400 MG CAPS Take 1,000 mg by mouth daily.       Marland Kitchen oxyCODONE-acetaminophen (PERCOCET/ROXICET) 5-325 MG per tablet Take 1-2 tablets by mouth every 4 (four) hours as needed for pain.  75 tablet  0  . varenicline (CHANTIX) 0.5 MG tablet Take 0.5  mg by mouth 2 (two) times daily.       No current facility-administered medications for this visit.    Pt meds include: Statin :Yes ASA: No, advised to resume Other anticoagulants/antiplatelets: no  Past Medical History  Diagnosis Date  . Carotid artery occlusion   . Fibromyalgia   . Esophageal reflux   . Asthma   . Hyperlipidemia   . Gall stones   . Cervical disc displacement     x2  . Lumbar disc herniation     L5-S1  . Allergy     allergic rhinitis  . COPD (chronic obstructive pulmonary disease)   . Generalized anxiety disorder   . CAD (coronary artery disease)     patient says no problems   dr Williemae Natter [place]    Past Surgical History  Procedure Laterality Date  . Carotid endarterectomy  02/01/2009    Right  . Tubal ligation    . Cervical fusion      1995, 1996  . Ectopic pregnancy surgery  89  . Back surgery  06/01/2012   . Spine surgery      cervical fusion x 2  . Spine surgery  06-01-12    Lower Back SI    Social History History  Substance Use Topics  . Smoking status: Current Every Day Smoker -- 0.20 packs/day for 45 years    Types: Cigarettes  . Smokeless tobacco: Never Used     Comment: pt states she only smokes about 2-3 cigs per day  . Alcohol Use: Yes     Comment: Occasional    Family History Family History  Problem Relation Age of Onset  . Stroke Mother   . Hypertension Mother   . Hyperlipidemia Mother   . Diabetes Father   . Heart disease Father     Heart Disease before age 61  . Heart disease Paternal Uncle   . Heart attack Paternal Uncle   . Stroke Maternal Grandmother   . Cancer Maternal Grandfather     stomach cancer  . Heart attack Maternal Grandfather   . Heart disease Paternal Grandfather   . Deep vein thrombosis Brother   . Cancer Paternal Grandmother 46    Allergies  Allergen Reactions  . Wellbutrin [Bupropion] Other (See Comments)    Hallucinations  . Septra [Sulfamethoxazole-Tmp Ds] Nausea And Vomiting    Vomiting  . Sulfa Antibiotics Nausea And Vomiting  . Zithromax [Azithromycin] Hives  . Cymbalta [Duloxetine Hcl]     fatigue     REVIEW OF SYSTEMS  General: [ ]  Weight loss, [ ]  Fever, [ ]  chills Neurologic: [ ]  Dizziness, [ ]  Blackouts, [ ]  Seizure [ ]  Stroke, [ ]  "Mini stroke", [ ]  Slurred speech, [ ]  Temporary blindness; [ ]  weakness in arms or legs, [ ]  Hoarseness [ ]  Dysphagia Cardiac: [ ]  Chest pain/pressure, [ ]  Shortness of breath at rest [ ]  Shortness of breath with exertion, [ ]  Atrial fibrillation or irregular heartbeat  Vascular: [ ]  Pain in legs with walking, [ ]  Pain in legs at rest, [ ]  Pain in legs at night,  [ ]  Non-healing ulcer, [ ]  Blood clot in vein/DVT,   Pulmonary: [ ]  Home oxygen, [ ]  Productive cough, [ ]  Coughing up blood, [ ]  Asthma,  [ ]  Wheezing [ ]  COPD Musculoskeletal:  [ ]  Arthritis, [ ]  Low back pain, [ ]  Joint  pain Hematologic: [ ]  Easy Bruising, [ ]  Anemia; [ ]  Hepatitis Gastrointestinal: [ ]   Blood in stool, [ ]  Gastroesophageal Reflux/heartburn, Urinary: [ ]  chronic Kidney disease, [ ]  on HD - [ ]  MWF or [ ]  TTHS, [ ]  Burning with urination, [ ]  Difficulty urinating Skin: [ ]  Rashes, [ ]  Wounds Psychological: [ ]  Anxiety, [ ]  Depression  Physical Examination Filed Vitals:   05/15/13 1532 05/15/13 1537  BP: 158/92 162/91  Pulse: 71 70  Resp: 16   SpO2: 96%    Filed Vitals:   05/15/13 1537  BP: 162/91  Pulse: 70  Resp:    Filed Weights   05/15/13 1537  Weight: 181 lb (82.101 kg)   Body mass index is 35.35 kg/(m^2).  General:  WDWN in NAD Gait: Normal HENT: WNL Eyes: Pupils equal Pulmonary:  non-labored breathing with moist cough, Rales in left posterior lung fields, no rhonchi or wheezing. Cardiac: RRR, without  Murmurs, rubs or gallops; No carotid bruits Abdomen: soft, NT, no masses Skin: no rashes, ulcers noted;  no Gangrene , no cellulitis; no open wounds;   Vascular Exam/Pulses: VASCULAR EXAM  Carotid Bruits Left Right   Negative Negative      Aorta is not palpable Bilateral radial pulses are 2+ palpable.                       VASCULAR EXAM: Extremities with mild dependent cyanosis in toes of both feet.  without Gangrene; without open wounds.                                                                                                          LE Pulses LEFT RIGHT       FEMORAL  not palpable  not palpable        POPLITEAL  not palpable   not palpable       POSTERIOR TIBIAL  not palpable   not palpable        DORSALIS PEDIS      ANTERIOR TIBIAL not palpable  faintly palpable      Musculoskeletal: no muscle wasting or atrophy; no edema  Neurologic: A&O X 3; Appropriate Affect ;  SENSATION: normal; MOTOR FUNCTION: 5/5 Symmetric, CN 2-12 intact Speech is fluent/normal  Non-Invasive Vascular Imaging (05/15/2013):  Carotid Duplex: Patent right CEA  site with no evidence for restenosis. 40-59% left ICA stenosis, calcific plaque may obscure higher velocities. Increased velocity in left ICA.  ABI's: Right: 0.96 with biphasic waveforms; TBI is 0.46; previous ABI on 05/16/12 was 0.97. Left: 0.63 with monophasic PT and biphasic DP; TBI is 0.42; previous ABI on 05/16/12 was 0.73.   ASSESSMENT:  Brachial pressures are equivalent. 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  Right ICA CEA site remains patent and left ICA remains at 40-59% stenosis but calcific plaque may obscure higher velocities. Patient remains asymptomatic of TIA or stroke symptoms. She does however continue to smoke  which facilitates arterial stenosis; she was counseled re this.    PLAN:   I advised patient to see her PCP ASAP re the rales in her left lung base, has had a cough for the last 2 weeks; advised also to ask her PCP about her blood pressure since patient states it has been high and is slightly elevated in office today. I advised patient to resume daily 81 mg ASA with food, to reduce her risk of stroke and progression of PAD.  Based on today's exam and vascular lab results, advised patient to return in 6 months for carotid Duplex and ABI's. I discussed in depth with the patient the nature of atherosclerosis, and emphasized the importance of maximal medical management including strict control of blood pressure, blood glucose, and lipid levels, obtaining regular exercise, and cessation of smoking.  The patient is aware that without maximal medical management the underlying atherosclerotic disease process will progress, limiting the benefit of any interventions. The patient was given information about stroke prevention and what symptoms should prompt the patient to seek immediate  medical care. The patient was given information about PAD including signs, symptoms, treatment, what symptoms should prompt the patient to seek immediate medical care, and risk reduction measures to take. The patent was counseled re smoking cessation and given printed information re same.  Charisse March, RN, MSN, FNP-C [facility] Office: [phone]  Clinic MD: Myra Gianotti 05/15/2013 3:49 PM

## 2013-05-15 NOTE — Patient Instructions (Addendum)
Stroke Prevention Some medical conditions and behaviors are associated with an increased chance of having a stroke. You may prevent a stroke by making healthy choices and managing medical conditions. Reduce your risk of having a stroke by:  Staying physically active. Get at least 30 minutes of activity on most or all days.  Not smoking. It may also be helpful to avoid exposure to secondhand smoke.  Limiting alcohol use. Moderate alcohol use is considered to be:  No more than 2 drinks per day for men.  No more than 1 drink per day for nonpregnant women.  Eating healthy foods.  Include 5 or more servings of fruits and vegetables a day.  Certain diets may be prescribed to address high blood pressure, high cholesterol, diabetes, or obesity.  Managing your cholesterol levels.  A low-saturated fat, low-trans fat, low-cholesterol, and high-fiber diet may control cholesterol levels.  Take any prescribed medicines to control cholesterol as directed by your caregiver.  Managing your diabetes.  A controlled-carbohydrate, controlled-sugar diet is recommended to manage diabetes.  Take any prescribed medicines to control diabetes as directed by your caregiver.  Controlling your high blood pressure (hypertension).  A low-salt (sodium), low-saturated fat, low-trans fat, and low-cholesterol diet is recommended to manage high blood pressure.  Take any prescribed medicines to control hypertension as directed by your caregiver.  Maintaining a healthy weight.  A reduced-calorie, low-sodium, low-saturated fat, low-trans fat, low-cholesterol diet is recommended to manage weight.  Stopping drug abuse.  Avoiding birth control pills.  Talk to your caregiver about the risks of taking birth control pills if you are over 35 years old, smoke, get migraines, or have ever had a blood clot.  Getting evaluated for sleep disorders (sleep apnea).  Talk to your caregiver about getting a sleep evaluation  if you snore a lot or have excessive sleepiness.  Taking medicines as directed by your caregiver.  For some people, aspirin or blood thinners (anticoagulants) are helpful in reducing the risk of forming abnormal blood clots that can lead to stroke. If you have the irregular heart rhythm of atrial fibrillation, you should be on a blood thinner unless there is a good reason you cannot take them.  Understand all your medicine instructions. SEEK IMMEDIATE MEDICAL CARE IF:   You have sudden weakness or numbness of the face, arm, or leg, especially on one side of the body.  You have sudden confusion.  You have trouble speaking (aphasia) or understanding.  You have sudden trouble seeing in one or both eyes.  You have sudden trouble walking.  You have dizziness.  You have a loss of balance or coordination.  You have a sudden, severe headache with no known cause.  You have new chest pain or an irregular heartbeat. Any of these symptoms may represent a serious problem that is an emergency. Do not wait to see if the symptoms will go away. Get medical help right away. Call your local emergency services (911 in [place] Do not drive yourself to the hospital. Document Released: 08/20/2004 Document Revised: 10/05/2011 Document Reviewed: 03/02/2011 [employer] Patient Information 2014 [employer]  Peripheral Vascular Disease Peripheral Vascular Disease (PVD), also called Peripheral Arterial Disease (PAD), is a circulation problem caused by cholesterol (atherosclerotic plaque) deposits in the arteries. PVD commonly occurs in the lower extremities (legs) but it can occur in other areas of the body, such as your arms. The cholesterol buildup in the arteries reduces blood flow which can cause pain and other serious problems. The presence of   PVD can place a person at risk for Coronary Artery Disease (CAD).  CAUSES  Causes of PVD can be many. It is usually associated with more than one risk factor such  as:   High Cholesterol.  Smoking.  Diabetes.  Lack of exercise or inactivity.  High blood pressure (hypertension).  Obesity.  Family history. SYMPTOMS   When the lower extremities are affected, patients with PVD may experience:  Leg pain with exertion or physical activity. This is called INTERMITTENT CLAUDICATION. This may present as cramping or numbness with physical activity. The location of the pain is associated with the level of blockage. For example, blockage at the abdominal level (distal abdominal aorta) may result in buttock or hip pain. Lower leg arterial blockage may result in calf pain.  As PVD becomes more severe, pain can develop with less physical activity.  In people with severe PVD, leg pain may occur at rest.  Other PVD signs and symptoms:  Leg numbness or weakness.  Coldness in the affected leg or foot, especially when compared to the other leg.  A change in leg color.  Patients with significant PVD are more prone to ulcers or sores on toes, feet or legs. These may take longer to heal or may reoccur. The ulcers or sores can become infected.  If signs and symptoms of PVD are ignored, gangrene may occur. This can result in the loss of toes or loss of an entire limb.  Not all leg pain is related to PVD. Other medical conditions can cause leg pain such as:  Blood clots (embolism) or Deep Vein Thrombosis.  Inflammation of the blood vessels (vasculitis).  Spinal stenosis. DIAGNOSIS  Diagnosis of PVD can involve several different types of tests. These can include:  Pulse Volume Recording Method (PVR). This test is simple, painless and does not involve the use of X-rays. PVR involves measuring and comparing the blood pressure in the arms and legs. An ABI (Ankle-Brachial Index) is calculated. The normal ratio of blood pressures is 1. As this number becomes smaller, it indicates more severe disease.  < 0.95  indicates significant narrowing in one or more leg  vessels.  <0.8 there will usually be pain in the foot, leg or buttock with exercise.  <0.4 will usually have pain in the legs at rest.  <0.25  usually indicates limb threatening PVD.  Doppler detection of pulses in the legs. This test is painless and checks to see if you have a pulses in your legs/feet.  A dye or contrast material (a substance that highlights the blood vessels so they show up on x-ray) may be given to help your caregiver better see the arteries for the following tests. The dye is eliminated from your body by the kidney's. Your caregiver may order blood work to check your kidney function and other laboratory values before the following tests are performed:  Magnetic Resonance Angiography (MRA). An MRA is a picture study of the blood vessels and arteries. The MRA machine uses a large magnet to produce images of the blood vessels.  Computed Tomography Angiography (CTA). A CTA is a specialized x-ray that looks at how the blood flows in your blood vessels. An IV may be inserted into your arm so contrast dye can be injected.  Angiogram. Is a procedure that uses x-rays to look at your blood vessels. This procedure is minimally invasive, meaning a small incision (cut) is made in your groin. A small tube (catheter) is then inserted into the artery of   your groin. The catheter is guided to the blood vessel or artery your caregiver wants to examine. Contrast dye is injected into the catheter. X-rays are then taken of the blood vessel or artery. After the images are obtained, the catheter is taken out. TREATMENT  Treatment of PVD involves many interventions which may include:  Lifestyle changes:  Quitting smoking.  Exercise.  Following a low fat, low cholesterol diet.  Control of diabetes.  Foot care is very important to the PVD patient. Good foot care can help prevent infection.  Medication:  Cholesterol-lowering medicine.  Blood pressure medicine.  Anti-platelet  drugs.  Certain medicines may reduce symptoms of Intermittent Claudication.  Interventional/Surgical options:  Angioplasty. An Angioplasty is a procedure that inflates a balloon in the blocked artery. This opens the blocked artery to improve blood flow.  Stent Implant. A wire mesh tube (stent) is placed in the artery. The stent expands and stays in place, allowing the artery to remain open.  Peripheral Bypass Surgery. This is a surgical procedure that reroutes the blood around a blocked artery to help improve blood flow. This type of procedure may be performed if Angioplasty or stent implants are not an option. SEEK IMMEDIATE MEDICAL CARE IF:   You develop pain or numbness in your arms or legs.  Your arm or leg turns cold, becomes blue in color.  You develop redness, warmth, swelling and pain in your arms or legs. MAKE SURE YOU:   Understand these instructions.  Will watch your condition.  Will get help right away if you are not doing well or get worse. Document Released: 08/20/2004 Document Revised: 10/05/2011 Document Reviewed: 07/17/2008 [employer] Patient Information 2014 [employer].   Smoking Cessation Quitting smoking is important to your health and has many advantages. However, it is not always easy to quit since nicotine is a very addictive drug. Often times, people try 3 times or more before being able to quit. This document explains the best ways for you to prepare to quit smoking. Quitting takes hard work and a lot of effort, but you can do it. ADVANTAGES OF QUITTING SMOKING  You will live longer, feel better, and live better.  Your body will feel the impact of quitting smoking almost immediately.  Within 20 minutes, blood pressure decreases. Your pulse returns to its normal level.  After 8 hours, carbon monoxide levels in the blood return to normal. Your oxygen level increases.  After 24 hours, the chance of having a heart attack starts to decrease. Your  breath, hair, and body stop smelling like smoke.  After 48 hours, damaged nerve endings begin to recover. Your sense of taste and smell improve.  After 72 hours, the body is virtually free of nicotine. Your bronchial tubes relax and breathing becomes easier.  After 2 to 12 weeks, lungs can hold more air. Exercise becomes easier and circulation improves.  The risk of having a heart attack, stroke, cancer, or lung disease is greatly reduced.  After 1 year, the risk of coronary heart disease is cut in half.  After 5 years, the risk of stroke falls to the same as a nonsmoker.  After 10 years, the risk of lung cancer is cut in half and the risk of other cancers decreases significantly.  After 15 years, the risk of coronary heart disease drops, usually to the level of a nonsmoker.  If you are pregnant, quitting smoking will improve your chances of having a healthy baby.  The people you live with, especially   any children, will be healthier.  You will have extra money to spend on things other than cigarettes. QUESTIONS TO THINK ABOUT BEFORE ATTEMPTING TO QUIT You may want to talk about your answers with your caregiver.  Why do you want to quit?  If you tried to quit in the past, what helped and what did not?  What will be the most difficult situations for you after you quit? How will you plan to handle them?  Who can help you through the tough times? Your family? Friends? A caregiver?  What pleasures do you get from smoking? What ways can you still get pleasure if you quit? Here are some questions to ask your caregiver:  How can you help me to be successful at quitting?  What medicine do you think would be best for me and how should I take it?  What should I do if I need more help?  What is smoking withdrawal like? How can I get information on withdrawal? GET READY  Set a quit date.  Change your environment by getting rid of all cigarettes, ashtrays, matches, and lighters in  your home, car, or work. Do not let people smoke in your home.  Review your past attempts to quit. Think about what worked and what did not. GET SUPPORT AND ENCOURAGEMENT You have a better chance of being successful if you have help. You can get support in many ways.  Tell your family, friends, and co-workers that you are going to quit and need their support. Ask them not to smoke around you.  Get individual, group, or telephone counseling and support. Programs are available at local hospitals and health centers. Call your local health department for information about programs in your area.  Spiritual beliefs and practices may help some smokers quit.  Download a "quit meter" on your computer to keep track of quit statistics, such as how long you have gone without smoking, cigarettes not smoked, and money saved.  Get a self-help book about quitting smoking and staying off of tobacco. LEARN NEW SKILLS AND BEHAVIORS  Distract yourself from urges to smoke. Talk to someone, go for a walk, or occupy your time with a task.  Change your normal routine. Take a different route to work. Drink tea instead of coffee. Eat breakfast in a different place.  Reduce your stress. Take a hot bath, exercise, or read a book.  Plan something enjoyable to do every day. Reward yourself for not smoking.  Explore interactive web-based programs that specialize in helping you quit. GET MEDICINE AND USE IT CORRECTLY Medicines can help you stop smoking and decrease the urge to smoke. Combining medicine with the above behavioral methods and support can greatly increase your chances of successfully quitting smoking.  Nicotine replacement therapy helps deliver nicotine to your body without the negative effects and risks of smoking. Nicotine replacement therapy includes nicotine gum, lozenges, inhalers, nasal sprays, and skin patches. Some may be available over-the-counter and others require a  prescription.  Antidepressant medicine helps people abstain from smoking, but how this works is unknown. This medicine is available by prescription.  Nicotinic receptor partial agonist medicine simulates the effect of nicotine in your brain. This medicine is available by prescription. Ask your caregiver for advice about which medicines to use and how to use them based on your health history. Your caregiver will tell you what side effects to look out for if you choose to be on a medicine or therapy. Carefully read the information on   the package. Do not use any other product containing nicotine while using a nicotine replacement product.  RELAPSE OR DIFFICULT SITUATIONS Most relapses occur within the first 3 months after quitting. Do not be discouraged if you start smoking again. Remember, most people try several times before finally quitting. You may have symptoms of withdrawal because your body is used to nicotine. You may crave cigarettes, be irritable, feel very hungry, cough often, get headaches, or have difficulty concentrating. The withdrawal symptoms are only temporary. They are strongest when you first quit, but they will go away within 10 14 days. To reduce the chances of relapse, try to:  Avoid drinking alcohol. Drinking lowers your chances of successfully quitting.  Reduce the amount of caffeine you consume. Once you quit smoking, the amount of caffeine in your body increases and can give you symptoms, such as a rapid heartbeat, sweating, and anxiety.  Avoid smokers because they can make you want to smoke.  Do not let weight gain distract you. Many smokers will gain weight when they quit, usually less than 10 pounds. Eat a healthy diet and stay active. You can always lose the weight gained after you quit.  Find ways to improve your mood other than smoking. FOR MORE INFORMATION  www.smokefree.gov  Document Released: 07/07/2001 Document Revised: 01/12/2012 Document Reviewed:  10/22/2011 [employer] Patient Information 2014 [employer].   

## 2013-05-16 NOTE — Addendum Note (Signed)
Addended by: Sharee Pimple on: 05/16/2013 07:36 AM   Modules accepted: Orders

## 2013-06-02 DIAGNOSIS — Z0279 Encounter for issue of other medical certificate: Secondary | ICD-10-CM

## 2013-07-07 DIAGNOSIS — Z0279 Encounter for issue of other medical certificate: Secondary | ICD-10-CM

## 2013-10-23 ENCOUNTER — Ambulatory Visit
Admission: RE | Admit: 2013-10-23 | Discharge: 2013-10-23 | Disposition: A | Discharge status: Home / Self Care | Payer: 59 | Source: Ambulatory Visit | Attending: Family Medicine | Admitting: Family Medicine

## 2013-10-23 ENCOUNTER — Other Ambulatory Visit: Payer: Self-pay | Admitting: Family Medicine

## 2013-10-23 DIAGNOSIS — R05 Cough: Secondary | ICD-10-CM

## 2013-10-23 DIAGNOSIS — R053 Chronic cough: Secondary | ICD-10-CM

## 2013-11-17 ENCOUNTER — Encounter: Payer: Self-pay | Admitting: Family

## 2013-11-20 ENCOUNTER — Ambulatory Visit: Payer: 59 | Admitting: Family

## 2013-11-20 ENCOUNTER — Encounter (HOSPITAL_COMMUNITY): Payer: 59

## 2013-11-20 ENCOUNTER — Other Ambulatory Visit (HOSPITAL_COMMUNITY): Payer: 59

## 2013-12-20 ENCOUNTER — Telehealth: Payer: Self-pay | Admitting: Family

## 2013-12-20 NOTE — Telephone Encounter (Addendum)
Message copied by Fredrich Birks on Wed Dec 20, 2013  9:18 AM ------      Message from: Marcellus Scott      Created: Tue Dec 19, 2013  3:06 PM      Regarding: CANCELLATION FOR FRIDAY      Contact: [phone]       Cancel her appointment Friday. Please set up another appointment. Give her a call with new date and time.            Thanks      Revonda Standard ------  12/20/13: lm for pt with new date and time, dpm

## 2013-12-22 ENCOUNTER — Encounter (HOSPITAL_COMMUNITY): Payer: Self-pay

## 2013-12-22 ENCOUNTER — Ambulatory Visit: Payer: Self-pay | Admitting: Family

## 2013-12-22 ENCOUNTER — Other Ambulatory Visit (HOSPITAL_COMMUNITY): Payer: PRIVATE HEALTH INSURANCE

## 2013-12-22 ENCOUNTER — Other Ambulatory Visit (HOSPITAL_COMMUNITY): Payer: Self-pay

## 2014-01-19 ENCOUNTER — Encounter: Payer: Self-pay | Admitting: Family

## 2014-01-22 ENCOUNTER — Ambulatory Visit (INDEPENDENT_AMBULATORY_CARE_PROVIDER_SITE_OTHER)
Admission: RE | Admit: 2014-01-22 | Discharge: 2014-01-22 | Disposition: A | Discharge status: Home / Self Care | Payer: 59 | Source: Ambulatory Visit | Attending: Family | Admitting: Family

## 2014-01-22 ENCOUNTER — Ambulatory Visit (INDEPENDENT_AMBULATORY_CARE_PROVIDER_SITE_OTHER): Payer: 59 | Admitting: Family

## 2014-01-22 ENCOUNTER — Ambulatory Visit (HOSPITAL_COMMUNITY)
Admission: RE | Admit: 2014-01-22 | Discharge: 2014-01-22 | Disposition: A | Discharge status: Home / Self Care | Payer: 59 | Source: Ambulatory Visit | Attending: Family | Admitting: Family

## 2014-01-22 ENCOUNTER — Encounter: Payer: Self-pay | Admitting: Family

## 2014-01-22 VITALS — BP 181/90 | HR 74 | Resp 16 | Ht 59.5 in | Wt 179.0 lb

## 2014-01-22 DIAGNOSIS — Z48812 Encounter for surgical aftercare following surgery on the circulatory system: Secondary | ICD-10-CM | POA: Insufficient documentation

## 2014-01-22 DIAGNOSIS — I739 Peripheral vascular disease, unspecified: Secondary | ICD-10-CM

## 2014-01-22 DIAGNOSIS — I6529 Occlusion and stenosis of unspecified carotid artery: Secondary | ICD-10-CM | POA: Diagnosis not present

## 2014-01-22 NOTE — Patient Instructions (Signed)
Stroke Prevention Some medical conditions and behaviors are associated with an increased chance of having a stroke. You may prevent a stroke by making healthy choices and managing medical conditions. HOW CAN I REDUCE MY RISK OF HAVING A STROKE?   Stay physically active. Get at least 30 minutes of activity on most or all days.  Do not smoke. It may also be helpful to avoid exposure to secondhand smoke.  Limit alcohol use. Moderate alcohol use is considered to be:  No more than 2 drinks per day for men.  No more than 1 drink per day for nonpregnant women.  Eat healthy foods. This involves  Eating 5 or more servings of fruits and vegetables a day.  Following a diet that addresses high blood pressure (hypertension), high cholesterol, diabetes, or obesity.  Manage your cholesterol levels.  A diet low in saturated fat, trans fat, and cholesterol and high in fiber may control cholesterol levels.  Take any prescribed medicines to control cholesterol as directed by your health care provider.  Manage your diabetes.  A controlled-carbohydrate, controlled-sugar diet is recommended to manage diabetes.  Take any prescribed medicines to control diabetes as directed by your health care provider.  Control your hypertension.  A low-salt (sodium), low-saturated fat, low-trans fat, and low-cholesterol diet is recommended to manage hypertension.  Take any prescribed medicines to control hypertension as directed by your health care provider.  Maintain a healthy weight.  A reduced-calorie, low-sodium, low-saturated fat, low-trans fat, low-cholesterol diet is recommended to manage weight.  Stop drug abuse.  Avoid taking birth control pills.  Talk to your health care provider about the risks of taking birth control pills if you are over 35 years old, smoke, get migraines, or have ever had a blood clot.  Get evaluated for sleep disorders (sleep apnea).  Talk to your health care provider about  getting a sleep evaluation if you snore a lot or have excessive sleepiness.  Take medicines as directed by your health care provider.  For some people, aspirin or blood thinners (anticoagulants) are helpful in reducing the risk of forming abnormal blood clots that can lead to stroke. If you have the irregular heart rhythm of atrial fibrillation, you should be on a blood thinner unless there is a good reason you cannot take them.  Understand all your medicine instructions.  Make sure that other other conditions (such as anemia or atherosclerosis) are addressed. SEEK IMMEDIATE MEDICAL CARE IF:   You have sudden weakness or numbness of the face, arm, or leg, especially on one side of the body.  Your face or eyelid droops to one side.  You have sudden confusion.  You have trouble speaking (aphasia) or understanding.  You have sudden trouble seeing in one or both eyes.  You have sudden trouble walking.  You have dizziness.  You have a loss of balance or coordination.  You have a sudden, severe headache with no known cause.  You have new chest pain or an irregular heartbeat. Any of these symptoms may represent a serious problem that is an emergency. Do not wait to see if the symptoms will go away. Get medical help at once. Call your local emergency services  (911 in [place] Do not drive yourself to the hospital. Document Released: 08/20/2004 Document Revised: 05/03/2013 Document Reviewed: 01/13/2013 [employer] Patient Information 2015 [employer]. This information is not intended to replace advice given to you by your health care provider. Make sure you discuss any questions you have with your health   care provider.   Peripheral Vascular Disease Peripheral Vascular Disease (PVD), also called Peripheral Arterial Disease (PAD), is a circulation problem caused by cholesterol (atherosclerotic plaque) deposits in the arteries. PVD commonly occurs in the lower extremities (legs) but it can  occur in other areas of the body, such as your arms. The cholesterol buildup in the arteries reduces blood flow which can cause pain and other serious problems. The presence of PVD can place a person at risk for Coronary Artery Disease (CAD).  CAUSES  Causes of PVD can be many. It is usually associated with more than one risk factor such as:   High Cholesterol.  Smoking.  Diabetes.  Lack of exercise or inactivity.  High blood pressure (hypertension).  Obesity.  Family history. SYMPTOMS   When the lower extremities are affected, patients with PVD may experience:  Leg pain with exertion or physical activity. This is called INTERMITTENT CLAUDICATION. This may present as cramping or numbness with physical activity. The location of the pain is associated with the level of blockage. For example, blockage at the abdominal level (distal abdominal aorta) may result in buttock or hip pain. Lower leg arterial blockage may result in calf pain.  As PVD becomes more severe, pain can develop with less physical activity.  In people with severe PVD, leg pain may occur at rest.  Other PVD signs and symptoms:  Leg numbness or weakness.  Coldness in the affected leg or foot, especially when compared to the other leg.  A change in leg color.  Patients with significant PVD are more prone to ulcers or sores on toes, feet or legs. These may take longer to heal or may reoccur. The ulcers or sores can become infected.  If signs and symptoms of PVD are ignored, gangrene may occur. This can result in the loss of toes or loss of an entire limb.  Not all leg pain is related to PVD. Other medical conditions can cause leg pain such as:  Blood clots (embolism) or Deep Vein Thrombosis.  Inflammation of the blood vessels (vasculitis).  Spinal stenosis. DIAGNOSIS  Diagnosis of PVD can involve several different types of tests. These can include:  Pulse Volume Recording Method (PVR). This test is simple,  painless and does not involve the use of X-rays. PVR involves measuring and comparing the blood pressure in the arms and legs. An ABI (Ankle-Brachial Index) is calculated. The normal ratio of blood pressures is 1. As this number becomes smaller, it indicates more severe disease.  < 0.95 - indicates significant narrowing in one or more leg vessels.  <0.8 - there will usually be pain in the foot, leg or buttock with exercise.  <0.4 - will usually have pain in the legs at rest.  <0.25 - usually indicates limb threatening PVD.  Doppler detection of pulses in the legs. This test is painless and checks to see if you have a pulses in your legs/feet.  A dye or contrast material (a substance that highlights the blood vessels so they show up on x-ray) may be given to help your caregiver better see the arteries for the following tests. The dye is eliminated from your body by the kidney's. Your caregiver may order blood work to check your kidney function and other laboratory values before the following tests are performed:  Magnetic Resonance Angiography (MRA). An MRA is a picture study of the blood vessels and arteries. The MRA machine uses a large magnet to produce images of the blood vessels.  Computed   Tomography Angiography (CTA). A CTA is a specialized x-ray that looks at how the blood flows in your blood vessels. An IV may be inserted into your arm so contrast dye can be injected.  Angiogram. Is a procedure that uses x-rays to look at your blood vessels. This procedure is minimally invasive, meaning a small incision (cut) is made in your groin. A small tube (catheter) is then inserted into the artery of your groin. The catheter is guided to the blood vessel or artery your caregiver wants to examine. Contrast dye is injected into the catheter. X-rays are then taken of the blood vessel or artery. After the images are obtained, the catheter is taken out. TREATMENT  Treatment of PVD involves many  interventions which may include:  Lifestyle changes:  Quitting smoking.  Exercise.  Following a low fat, low cholesterol diet.  Control of diabetes.  Foot care is very important to the PVD patient. Good foot care can help prevent infection.  Medication:  Cholesterol-lowering medicine.  Blood pressure medicine.  Anti-platelet drugs.  Certain medicines may reduce symptoms of Intermittent Claudication.  Interventional/Surgical options:  Angioplasty. An Angioplasty is a procedure that inflates a balloon in the blocked artery. This opens the blocked artery to improve blood flow.  Stent Implant. A wire mesh tube (stent) is placed in the artery. The stent expands and stays in place, allowing the artery to remain open.  Peripheral Bypass Surgery. This is a surgical procedure that reroutes the blood around a blocked artery to help improve blood flow. This type of procedure may be performed if Angioplasty or stent implants are not an option. SEEK IMMEDIATE MEDICAL CARE IF:   You develop pain or numbness in your arms or legs.  Your arm or leg turns cold, becomes blue in color.  You develop redness, warmth, swelling and pain in your arms or legs. MAKE SURE YOU:   Understand these instructions.  Will watch your condition.  Will get help right away if you are not doing well or get worse. Document Released: 08/20/2004 Document Revised: 10/05/2011 Document Reviewed: 07/17/2008 [employer] Patient Information 2015 [employer]. This information is not intended to replace advice given to you by your health care provider. Make sure you discuss any questions you have with your health care provider.   Smoking Cessation Quitting smoking is important to your health and has many advantages. However, it is not always easy to quit since nicotine is a very addictive drug. Often times, people try 3 times or more before being able to quit. This document explains the best ways for you to prepare  to quit smoking. Quitting takes hard work and a lot of effort, but you can do it. ADVANTAGES OF QUITTING SMOKING  You will live longer, feel better, and live better.  Your body will feel the impact of quitting smoking almost immediately.  Within 20 minutes, blood pressure decreases. Your pulse returns to its normal level.  After 8 hours, carbon monoxide levels in the blood return to normal. Your oxygen level increases.  After 24 hours, the chance of having a heart attack starts to decrease. Your breath, hair, and body stop smelling like smoke.  After 48 hours, damaged nerve endings begin to recover. Your sense of taste and smell improve.  After 72 hours, the body is virtually free of nicotine. Your bronchial tubes relax and breathing becomes easier.  After 2 to 12 weeks, lungs can hold more air. Exercise becomes easier and circulation improves.  The risk of   having a heart attack, stroke, cancer, or lung disease is greatly reduced.  After 1 year, the risk of coronary heart disease is cut in half.  After 5 years, the risk of stroke falls to the same as a nonsmoker.  After 10 years, the risk of lung cancer is cut in half and the risk of other cancers decreases significantly.  After 15 years, the risk of coronary heart disease drops, usually to the level of a nonsmoker.  If you are pregnant, quitting smoking will improve your chances of having a healthy baby.  The people you live with, especially any children, will be healthier.  You will have extra money to spend on things other than cigarettes. QUESTIONS TO THINK ABOUT BEFORE ATTEMPTING TO QUIT You may want to talk about your answers with your caregiver.  Why do you want to quit?  If you tried to quit in the past, what helped and what did not?  What will be the most difficult situations for you after you quit? How will you plan to handle them?  Who can help you through the tough times? Your family? Friends? A  caregiver?  What pleasures do you get from smoking? What ways can you still get pleasure if you quit? Here are some questions to ask your caregiver:  How can you help me to be successful at quitting?  What medicine do you think would be best for me and how should I take it?  What should I do if I need more help?  What is smoking withdrawal like? How can I get information on withdrawal? GET READY  Set a quit date.  Change your environment by getting rid of all cigarettes, ashtrays, matches, and lighters in your home, car, or work. Do not let people smoke in your home.  Review your past attempts to quit. Think about what worked and what did not. GET SUPPORT AND ENCOURAGEMENT You have a better chance of being successful if you have help. You can get support in many ways.  Tell your family, friends, and co-workers that you are going to quit and need their support. Ask them not to smoke around you.  Get individual, group, or telephone counseling and support. Programs are available at local hospitals and health centers. Call your local health department for information about programs in your area.  Spiritual beliefs and practices may help some smokers quit.  Download a "quit meter" on your computer to keep track of quit statistics, such as how long you have gone without smoking, cigarettes not smoked, and money saved.  Get a self-help book about quitting smoking and staying off of tobacco. LEARN NEW SKILLS AND BEHAVIORS  Distract yourself from urges to smoke. Talk to someone, go for a walk, or occupy your time with a task.  Change your normal routine. Take a different route to work. Drink tea instead of coffee. Eat breakfast in a different place.  Reduce your stress. Take a hot bath, exercise, or read a book.  Plan something enjoyable to do every day. Reward yourself for not smoking.  Explore interactive web-based programs that specialize in helping you quit. GET MEDICINE AND USE  IT CORRECTLY Medicines can help you stop smoking and decrease the urge to smoke. Combining medicine with the above behavioral methods and support can greatly increase your chances of successfully quitting smoking.  Nicotine replacement therapy helps deliver nicotine to your body without the negative effects and risks of smoking. Nicotine replacement therapy includes nicotine gum, lozenges,   inhalers, nasal sprays, and skin patches. Some may be available over-the-counter and others require a prescription.  Antidepressant medicine helps people abstain from smoking, but how this works is unknown. This medicine is available by prescription.  Nicotinic receptor partial agonist medicine simulates the effect of nicotine in your brain. This medicine is available by prescription. Ask your caregiver for advice about which medicines to use and how to use them based on your health history. Your caregiver will tell you what side effects to look out for if you choose to be on a medicine or therapy. Carefully read the information on the package. Do not use any other product containing nicotine while using a nicotine replacement product.  RELAPSE OR DIFFICULT SITUATIONS Most relapses occur within the first 3 months after quitting. Do not be discouraged if you start smoking again. Remember, most people try several times before finally quitting. You may have symptoms of withdrawal because your body is used to nicotine. You may crave cigarettes, be irritable, feel very hungry, cough often, get headaches, or have difficulty concentrating. The withdrawal symptoms are only temporary. They are strongest when you first quit, but they will go away within 10-14 days. To reduce the chances of relapse, try to:  Avoid drinking alcohol. Drinking lowers your chances of successfully quitting.  Reduce the amount of caffeine you consume. Once you quit smoking, the amount of caffeine in your body increases and can give you symptoms,  such as a rapid heartbeat, sweating, and anxiety.  Avoid smokers because they can make you want to smoke.  Do not let weight gain distract you. Many smokers will gain weight when they quit, usually less than 10 pounds. Eat a healthy diet and stay active. You can always lose the weight gained after you quit.  Find ways to improve your mood other than smoking. FOR MORE INFORMATION  www.smokefree.gov  Document Released: 07/07/2001 Document Revised: 01/12/2012 Document Reviewed: 10/22/2011 [employer] Patient Information 2015 [employer]. This information is not intended to replace advice given to you by your health care provider. Make sure you discuss any questions you have with your health care provider.  

## 2014-01-22 NOTE — Progress Notes (Signed)
[facility] [place] HISTORY AND PHYSICAL   MRN : [id number]  History of Present Illness:   Norma Scott is a 64 y.o. female female patient of Dr. Myra Gianotti who returns for followup of 2 problems: Carotid stenosis, and lower extremity vascular disease. She is status post right carotid endarterectomy in July of 2010 for asymptomatic stenosis.  She had lumbar spine surgery Nov., 2013, states her right foot is often, but her legs feel stronger since the back surgery and she rarely falls compared to before her back surgery when she had frequent falls.  She still has throbbing in left thigh and calf after walking 1/4 of a block or less, relieved by rest, no claudication in right leg with walking, states she has increased her walking. She does yard work daily.  Patient denies non healing ulcers on lower extremities.  Patient has Negative history of TIA or stroke symptoms. The patient denies amaurosis fugax or monocular blindness. The patient denies facial drooping.  Pt. denies hemiplegia. The patient denies receptive or expressive aphasia.   Pt Diabetic: No  Pt smoker: smoker (1/2 ppd/day x 40+ yrs)  Pt meds include: Statin :Yes ASA: Yes Other anticoagulants/antiplatelets: no   Current Outpatient Prescriptions  Medication Sig Dispense Refill  . alendronate (FOSAMAX) 70 MG tablet Take 70 mg by mouth every 7 (seven) days. Take with a full glass of water on an empty stomach. Take on mondays      . aspirin 81 MG tablet Take 81 mg by mouth daily.      Marland Kitchen BIOTIN PO Take 10,000 mcg by mouth daily.      . cetirizine (ZYRTEC) 10 MG tablet Take 10 mg by mouth daily.      . clonazePAM (KLONOPIN) 0.5 MG tablet Take 0.5 mg by mouth daily.      . cyclobenzaprine (FLEXERIL) 10 MG tablet Take 0.5-1 tablets (5-10 mg total) by mouth 3 (three) times daily as needed for muscle spasms.  50 tablet  1  . diclofenac (VOLTAREN) 75 MG EC tablet Take 75 mg by mouth at bedtime.       Marland Kitchen  FLUoxetine (PROZAC) 20 MG capsule Take 60 mg by mouth daily.       . fluticasone (FLONASE) 50 MCG/ACT nasal spray Place 2 sprays into the nose daily.      Marland Kitchen gabapentin (NEURONTIN) 100 MG capsule Take 100 mg by mouth 4 (four) times daily.      . Multiple Vitamins-Minerals (WOMENS MULTI VITAMIN & MINERAL) TABS Take 1 tablet by mouth daily.      . Omega-3 Fatty Acids (FISH OIL TRIPLE STRENGTH) 1400 MG CAPS Take 1,000 mg by mouth daily.       Marland Kitchen omeprazole (PRILOSEC) 20 MG capsule Take 20 mg by mouth 2 (two) times daily as needed. For reflux      . rosuvastatin (CRESTOR) 10 MG tablet Take 10 mg by mouth daily.      Marland Kitchen amoxicillin-clavulanate (AUGMENTIN XR) 1000-62.5 MG per tablet Take 2 tablets by mouth 2 (two) times daily.      . Olopatadine HCl (PATADAY) 0.2 % SOLN Place 1 drop into both eyes daily as needed. For allergies      . oxyCODONE-acetaminophen (PERCOCET/ROXICET) 5-325 MG per tablet Take 1-2 tablets by mouth every 4 (four) hours as needed for pain.  75 tablet  0  . varenicline (CHANTIX) 0.5 MG tablet Take 0.5 mg by mouth 2 (two) times daily.       No  current facility-administered medications for this visit.    Past Medical History  Diagnosis Date  . Carotid artery occlusion   . Fibromyalgia   . Esophageal reflux   . Asthma   . Hyperlipidemia   . Gall stones   . Cervical disc displacement     x2  . Lumbar disc herniation     L5-S1  . Allergy     allergic rhinitis  . COPD (chronic obstructive pulmonary disease)   . Generalized anxiety disorder   . CAD (coronary artery disease)     patient says no problems   dr Williemae Nattercandice [place] [facility] pcp    Past Surgical History  Procedure Laterality Date  . Carotid endarterectomy  02/01/2009    Right  . Tubal ligation    . Cervical fusion      1995, 1996  . Ectopic pregnancy surgery  93  . Back surgery  06/01/2012  . Spine surgery      cervical fusion x 2  . Spine surgery  06-01-12    Lower Back SI    Social History History  Substance Use  Topics  . Smoking status: Current Some Day Smoker -- 0.20 packs/day for 45 years    Types: Cigarettes  . Smokeless tobacco: Never Used     Comment: pt states she only smokes about 2-3 cigs per day  . Alcohol Use: Yes     Comment: Occasional    Family History Family History  Problem Relation Age of Onset  . Stroke Mother   . Hypertension Mother   . Hyperlipidemia Mother   . Diabetes Father   . Heart disease Father     Heart Disease before age 62  . Heart disease Paternal Uncle   . Heart attack Paternal Uncle   . Stroke Maternal Grandmother   . Cancer Maternal Grandfather     stomach cancer  . Heart attack Maternal Grandfather   . Heart disease Paternal Grandfather   . Deep vein thrombosis Brother   . Cancer Paternal Grandmother [id number]   Allergies  Allergen Reactions  . Wellbutrin [Bupropion] Other (See Comments)    Hallucinations  . Septra [Sulfamethoxazole-Tmp Ds] Nausea And Vomiting    Vomiting  . Sulfa Antibiotics Nausea And Vomiting  . Zithromax [Azithromycin] Hives  . Cymbalta [Duloxetine Hcl]     fatigue     REVIEW OF SYSTEMS: See HPI for pertinent positives and negatives.  Physical Examination Filed Vitals:   01/22/14 1421 01/22/14 1425  BP: 179/88 181/90  Pulse: 74 74  Resp:  16  Height:  4' 11.5" (1.511 m)  Weight:  179 lb (81.194 kg)  SpO2:  97%   Body mass index is 35.56 kg/(m^2).  General: WDWN obese female in NAD  Gait: Normal  HENT: WNL  Eyes: Pupils equal  Pulmonary: non-labored breathing with moist cough, Rales in left posterior lung fields, no rhonchi or wheezing.  Cardiac: RRR, without Murmurs, rubs or gallops;  Abdomen: soft, NT, no masses  Skin: no rashes, ulcers noted; no Gangrene , no cellulitis; no open wounds.  VASCULAR EXAM  Carotid Bruits  Left  Right    Negative  Negative   Aorta is not palpable  Bilateral radial pulses are 2+ palpable.   Extremities with mild dependent cyanosis in toes of both feet.  without Gangrene;  without open wounds.   LE Pulses  LEFT  RIGHT   FEMORAL  not palpable  not palpable   POPLITEAL  not palpable  not palpable  POSTERIOR TIBIAL  not palpable  not palpable   DORSALIS PEDIS  ANTERIOR TIBIAL  not palpable  faintly palpable    Musculoskeletal: no muscle wasting or atrophy; no edema  Neurologic: A&O X 3; Appropriate Affect ;  SENSATION: normal;  MOTOR FUNCTION: 5/5 Symmetric, CN 2-12 intact  Speech is fluent/normal   Non-Invasive Vascular Imaging (01/22/2014):  LOWER EXTREMITY ARTERIAL DUPLEX EVALUATION    INDICATION: PVD    PREVIOUS INTERVENTION(S): None    DUPLEX EXAM: Lower extremity arterial duplex    RIGHT  LEFT   Peak Systolic Velocity (cm/s) Ratio (if abnormal) Waveform  Peak Systolic Velocity (cm/s) Ratio (if abnormal) Waveform  140  T Common Femoral Artery 97  M  136  T Deep Femoral Artery 86  M  172  T Superficial Femoral Artery Proximal 74  M  72  T Superficial Femoral Artery Mid 58  M  67  T Superficial Femoral Artery Distal 50  M  66  T Popliteal Artery 35  M  44  B Posterior Tibial Artery Dist 21  M  40  B Anterior Tibial Artery Distal 15  M  23  B Peroneal Artery Distal 20  M  1.01 Today's ABI / TBI .71  .96 Previous ABI / TBI ( 05/15/13 ) .63    Waveform:    M - Monophasic       B - Biphasic       T - Triphasic  If Ankle Brachial Index (ABI) or Toe Brachial Index (TBI) performed, please see complete report     ADDITIONAL FINDINGS:     IMPRESSION: 1. No evidence of significant right lower extremity arterial occlusive disease. 2. Left ilio-femoral arterial occlusive disease    Compared to the previous exam:  No significant change.    CEREBROVASCULAR DUPLEX EVALUATION    INDICATION: Carotid artery disease    PREVIOUS INTERVENTION(S): Right carotid endarterectomy 02/01/2009 by Dr. Myra GianottiBrabham    DUPLEX EXAM: Carotid duplex    RIGHT  LEFT  Peak Systolic Velocities (cm/s) End Diastolic Velocities (cm/s) Plaque LOCATION Peak Systolic  Velocities (cm/s) End Diastolic Velocities (cm/s) Plaque  99 32 - CCA PROXIMAL 141 36 -  98 32 - CCA MID 113 32 -  75 23 HT CCA DISTAL 103 31 HT  213 41 - ECA 116 19 -  95 31 - ICA PROXIMAL 170 72 HT  139 52 - ICA MID 126 43 -  116 44 - ICA DISTAL 125 39 -    N/A ICA / CCA Ratio (PSV) 1.5  Antegrade Vertebral Flow Antegrade  152 Brachial Systolic Pressure (mmHg) 151  Triphasic Brachial Artery Waveforms Triphasic    Plaque Morphology:  HM = Homogeneous, HT = Heterogeneous, CP = Calcific Plaque, SP = Smooth Plaque, IP = Irregular Plaque     ADDITIONAL FINDINGS:     IMPRESSION: 1. Patent right carotid endarterectomy site with no evidence for restenosis. 2. 40 - 59% left internal carotid artery stenosis, calcific plaque may obscure higher velocity    Compared to the previous exam:  No change.    ASSESSMENT:  Aida Pufferorma C Keenum is a 63 y.o. female who is s/p right carotid endarterectomy 02/01/2009 and has moderate left ilio-femoral arterial occlusive disease. She has no remote or recent history of stroke or TIA. Her right CEA site is patent with no evidence for restenosis and her left ICA has 40-59% stenosis; this is unchanged from the previous study. Her ABI's are slightly improved from October, 2014.  Her atherosclerotic risk factors include smoking, uncontrolled hypertension, and obesity. She is again hypertensive at this visit.   PLAN:   Advised to see her PCP ASAP re her uncontrolled hypertension.  Graduated walking program. Pt was counseled re smoking cessation.  Based on today's exam and non-invasive vascular lab results, the patient will follow up in 1 year with the following tests carotid Duplex and ABI's.  I discussed in depth with the patient the nature of atherosclerosis, and emphasized the importance of maximal medical management including strict control of blood pressure, blood glucose, and lipid levels, obtaining regular exercise, and cessation of smoking.  The  patient is aware that without maximal medical management the underlying atherosclerotic disease process will progress, limiting the benefit of any interventions.  The patient was given information about stroke prevention and what symptoms should prompt the patient to seek immediate medical care.  The patient was given information about PAD including signs, symptoms, treatment, what symptoms should prompt the patient to seek immediate medical care, and risk reduction measures to take. Thank you for allowing [place] to participate in this patient's care.  Charisse March, RN, MSN, FNP-C [facility] Office: [phone]  Clinic QQ:[id number] 01/22/2014 2:38 PM

## 2014-05-25 ENCOUNTER — Other Ambulatory Visit: Payer: Self-pay | Admitting: Gastroenterology

## 2014-05-25 DIAGNOSIS — R1310 Dysphagia, unspecified: Secondary | ICD-10-CM

## 2014-05-30 ENCOUNTER — Ambulatory Visit
Admission: RE | Admit: 2014-05-30 | Discharge: 2014-05-30 | Disposition: A | Discharge status: Home / Self Care | Payer: 59 | Source: Ambulatory Visit | Attending: Gastroenterology | Admitting: Gastroenterology

## 2014-05-30 DIAGNOSIS — R1310 Dysphagia, unspecified: Secondary | ICD-10-CM

## 2014-05-31 ENCOUNTER — Other Ambulatory Visit: Payer: Self-pay

## 2014-05-31 DIAGNOSIS — Z1231 Encounter for screening mammogram for malignant neoplasm of breast: Secondary | ICD-10-CM

## 2014-06-07 ENCOUNTER — Other Ambulatory Visit (HOSPITAL_COMMUNITY): Payer: Self-pay | Admitting: Gastroenterology

## 2014-06-07 DIAGNOSIS — R1310 Dysphagia, unspecified: Secondary | ICD-10-CM

## 2014-06-15 ENCOUNTER — Ambulatory Visit (HOSPITAL_COMMUNITY)
Admission: RE | Admit: 2014-06-15 | Discharge: 2014-06-15 | Disposition: A | Discharge status: Home / Self Care | Payer: 59 | Source: Ambulatory Visit | Attending: Gastroenterology | Admitting: Gastroenterology

## 2014-06-15 ENCOUNTER — Ambulatory Visit
Admission: RE | Admit: 2014-06-15 | Discharge: 2014-06-15 | Disposition: A | Discharge status: Home / Self Care | Payer: 59 | Source: Ambulatory Visit

## 2014-06-15 DIAGNOSIS — M797 Fibromyalgia: Secondary | ICD-10-CM | POA: Diagnosis not present

## 2014-06-15 DIAGNOSIS — I2510 Atherosclerotic heart disease of native coronary artery without angina pectoris: Secondary | ICD-10-CM | POA: Diagnosis not present

## 2014-06-15 DIAGNOSIS — K219 Gastro-esophageal reflux disease without esophagitis: Secondary | ICD-10-CM | POA: Insufficient documentation

## 2014-06-15 DIAGNOSIS — M62838 Other muscle spasm: Secondary | ICD-10-CM | POA: Insufficient documentation

## 2014-06-15 DIAGNOSIS — R1310 Dysphagia, unspecified: Secondary | ICD-10-CM

## 2014-06-15 DIAGNOSIS — K449 Diaphragmatic hernia without obstruction or gangrene: Secondary | ICD-10-CM | POA: Diagnosis not present

## 2014-06-15 DIAGNOSIS — F458 Other somatoform disorders: Secondary | ICD-10-CM | POA: Diagnosis not present

## 2014-06-15 DIAGNOSIS — F419 Anxiety disorder, unspecified: Secondary | ICD-10-CM | POA: Insufficient documentation

## 2014-06-15 DIAGNOSIS — R1110 Vomiting, unspecified: Secondary | ICD-10-CM | POA: Insufficient documentation

## 2014-06-15 DIAGNOSIS — Z1231 Encounter for screening mammogram for malignant neoplasm of breast: Secondary | ICD-10-CM

## 2014-06-15 DIAGNOSIS — J449 Chronic obstructive pulmonary disease, unspecified: Secondary | ICD-10-CM | POA: Diagnosis not present

## 2014-06-15 DIAGNOSIS — Z981 Arthrodesis status: Secondary | ICD-10-CM | POA: Insufficient documentation

## 2014-06-15 NOTE — Procedures (Signed)
Objective Swallowing Evaluation: Modified Barium Swallowing Study  Patient Details  Name: Norma Scott MRN: [id number] Date of Birth: 04/11/1951  Today's Date: 06/15/2014 Time: [id number] SLP Time Calculation (min) (ACUTE ONLY): 22 min  Past Medical History:  Past Medical History  Diagnosis Date  . Carotid artery occlusion   . Fibromyalgia   . Esophageal reflux   . Asthma   . Hyperlipidemia   . Gall stones   . Cervical disc displacement     x2  . Lumbar disc herniation     L5-S1  . Allergy     allergic rhinitis  . COPD (chronic obstructive pulmonary disease)   . Generalized anxiety disorder   . CAD (coronary artery disease)     patient says no problems   dr Williemae Nattercandice [place] [facility] pcp   Past Surgical History:  Past Surgical History  Procedure Laterality Date  . Carotid endarterectomy  02/01/2009    Right  . Tubal ligation    . Cervical fusion      1995, 1996  . Ectopic pregnancy surgery  93  . Back surgery  06/01/2012  . Spine surgery      cervical fusion x 2  . Spine surgery  06-01-12    Lower Back SI   HPI:  63 yr old seen for outpatient MBS wtih complaints of globus sensation in pharynx, regurgitation of mucous, throat clearing during/after meals.  Pt. underwent barium esophagram 05/30/14 revealing a small hiatal hernia and spasm of cricopharyngeus muscle.  Pt. denies pna.  PMH:  GERD, fibromyalgia, COPD, anxiety, CEA 01/2009, lumbar fusion x 2, CAD.  She reports initiating meds for GERD that have alleviated some symptoms.     Assessment / Plan / Recommendation Clinical Impression  Dysphagia Diagnosis: Within Functional Limits Clinical impression: Oral prep, mastication and transit were within functional limits. Pharyngeal phase revealed timely swallow initiation, adequate pharyngeal contraction, tongue base retraction, laryngeal elevation and laryngeal closure.  No penetration or aspiration observed.  Barium passed through UES/cricopharyngeus muscle without difficulty  and no residual.  Esophagus briefly scanned revealing adequate transit of barium pill.  Recommend continue regular diet texture and thin liquids and esophageal precautions that SLP educated pt.    Treatment Recommendation  No treatment recommended at this time    Diet Recommendation Regular;Thin liquid   Liquid Administration via: Cup;Straw Medication Administration: Whole meds with liquid Supervision: Patient able to self feed Compensations: Slow rate;Small sips/bites;Follow solids with liquid Postural Changes and/or Swallow Maneuvers: Seated upright 90 degrees;Upright 30-60 min after meal    Other  Recommendations Oral Care Recommendations: Oral care BID   Follow Up Recommendations  None    Frequency and Duration        Pertinent Vitals/Pain No pain            Reason for Referral Objectively evaluate swallowing function   Oral Phase Oral Preparation/Oral Phase Oral Phase: WFL   Pharyngeal Phase Pharyngeal Phase Pharyngeal Phase: Within functional limits  Cervical Esophageal Phase    GO    Cervical Esophageal Phase Cervical Esophageal Phase: [facility]    Functional Assessment Tool Used: skilled clinical judgement Functional Limitations: Swallowing Swallow Current Status (H0865(G8996): 0 percent impaired, limited or restricted Swallow Goal Status (H8469(G8997): 0 percent impaired, limited or restricted Swallow Discharge Status [phone](G8998): 0 percent impaired, limited or restricted    Royce MacadamiaLitaker, Wes Lezotte Willis 06/15/2014, 1:35 PM   Norma Scott M.Ed [employer] Pager [phone]

## 2014-12-05 ENCOUNTER — Telehealth: Payer: Self-pay

## 2014-12-05 NOTE — Telephone Encounter (Signed)
Phone call from pt. @ 1:50 PM.  Left voice message that she wanted "to discuss stroke-like symptoms with a nurse."  Reported episode of dizziness, generalized tingling, lips numb, and double vision @ approx. 1:00 PM.  Stated my daughter is a CNA and took my BP; BP 150/123 (R) arm, and 170/93 (L) arm.  Stated the episode lasted approx. 20-25 minutes.  Stated "I didn't feel right."  Denies any residual symptoms at this time (2:20 PM)  Advised pt. to call her PCP ASAP today, and get an appt.; advised if unable to reach a nurse at the PCP office, she should go to the ER.  Verb. Understanding.   Stated her daughter is with her at this time.

## 2015-01-15 DIAGNOSIS — Z0279 Encounter for issue of other medical certificate: Secondary | ICD-10-CM

## 2015-01-31 ENCOUNTER — Encounter: Payer: Self-pay | Admitting: Family

## 2015-02-04 ENCOUNTER — Encounter (HOSPITAL_COMMUNITY): Payer: Self-pay

## 2015-02-04 ENCOUNTER — Ambulatory Visit: Payer: PRIVATE HEALTH INSURANCE | Admitting: Family

## 2015-03-05 DIAGNOSIS — E780 Pure hypercholesterolemia: Secondary | ICD-10-CM | POA: Diagnosis not present

## 2015-03-05 DIAGNOSIS — I10 Essential (primary) hypertension: Secondary | ICD-10-CM | POA: Diagnosis not present

## 2015-05-08 ENCOUNTER — Telehealth: Payer: Self-pay | Admitting: Cardiovascular Disease

## 2015-05-08 NOTE — Telephone Encounter (Signed)
Received records from [facility] for appointment on 05/21/15 with Dr Giles.  Records given to N Hines (medical records) for Dr Kimberly's schedule on 05/21/15. lp °

## 2015-05-21 ENCOUNTER — Encounter: Payer: Self-pay | Admitting: Cardiovascular Disease

## 2015-05-21 ENCOUNTER — Ambulatory Visit (INDEPENDENT_AMBULATORY_CARE_PROVIDER_SITE_OTHER): Payer: Medicare Other | Admitting: Cardiovascular Disease

## 2015-05-21 VITALS — BP 154/80 | HR 72 | Ht 60.0 in | Wt 193.9 lb

## 2015-05-21 DIAGNOSIS — I2510 Atherosclerotic heart disease of native coronary artery without angina pectoris: Secondary | ICD-10-CM

## 2015-05-21 DIAGNOSIS — I10 Essential (primary) hypertension: Secondary | ICD-10-CM

## 2015-05-21 DIAGNOSIS — I739 Peripheral vascular disease, unspecified: Secondary | ICD-10-CM

## 2015-05-21 DIAGNOSIS — G459 Transient cerebral ischemic attack, unspecified: Secondary | ICD-10-CM | POA: Diagnosis not present

## 2015-05-21 DIAGNOSIS — Z79899 Other long term (current) drug therapy: Secondary | ICD-10-CM

## 2015-05-21 DIAGNOSIS — I779 Disorder of arteries and arterioles, unspecified: Secondary | ICD-10-CM | POA: Diagnosis not present

## 2015-05-21 DIAGNOSIS — E785 Hyperlipidemia, unspecified: Secondary | ICD-10-CM

## 2015-05-21 HISTORY — DX: Essential (primary) hypertension: I10

## 2015-05-21 HISTORY — DX: Atherosclerotic heart disease of native coronary artery without angina pectoris: I25.10

## 2015-05-21 HISTORY — DX: Transient cerebral ischemic attack, unspecified: G45.9

## 2015-05-21 NOTE — Progress Notes (Signed)
Cardiology Office Note   Date:  05/21/2015   ID:  Norma Scott, DOB [birth year], MRN [id number]  PCP:  Allean Found, MD  Cardiologist:   Madilyn Hook, MD   Chief Complaint  Patient presents with  . New Evaluation    sob/"stroke"/"mild mi"      History of Present Illness: Norma Scott is a 64 y.o. female with hyperlipidemia, hypertension, carotid artery disease s/p R CEA, TIA, and ongoing tobacco abuse who presents to establish care.  Norma Scott was seen by Dr. Merri Brunette on January 03, 2015. At that appointment she reported having a mini stroke that occurred in May. Her aspirin was increased to 325 mg and she was started on lisinopril 5 mg daily for hypertension. Rosuvastatin was switched to atorvastatin.  She follows up with her vascular surgeon and is scheduled to have her carotids re-imaged now.  She continues to have episodes of slurred speech and word-finding difficulty, though not as severe as in May.  Norma Scott had a cholocystectomy this summer while on vacation in [place], [place].  During this time she reportedly had a mild heart attack.  She was managed medically and did not have a heart cath at that time.  Since then she has not had any chest pain.  She does not exercise but she does house work.  She doesn't have chest pain with these activities.  She has a treadmill and wants to start back walking.  Norma Scott notes increased shortness of breath over the last year.  This happens with exertion and she attributes this to gaining 10 lb in 1 year.  She also continues to smoke approximately 2 cigarettes daily.  She has significantly decreased her tobacco use since her cholecystectomy. She was able to quit successfully with the use of Chantix in the past.  She is not interested in using this again.She also has a lot of stress, as four out of five of her adult children and several grandchildren are living with her.  Norma Scott notes LE  edema but denies orthopnea or PND.     Past Medical History  Diagnosis Date  . Carotid artery occlusion   . Fibromyalgia   . Esophageal reflux   . Asthma   . Hyperlipidemia   . Gall stones   . Cervical disc displacement     x2  . Lumbar disc herniation     L5-S1  . Allergy     allergic rhinitis  . COPD (chronic obstructive pulmonary disease) (HCC)   . Generalized anxiety disorder   . CAD (coronary artery disease)     patient says no problems   dr Williemae Natter smith pcp  . TIA (transient ischemic attack) 05/21/2015  . Essential hypertension 05/21/2015  . Arteriosclerotic coronary artery disease 05/21/2015    Past Surgical History  Procedure Laterality Date  . Carotid endarterectomy  02/01/2009    Right  . Tubal ligation    . Cervical fusion      1995, 1996  . Ectopic pregnancy surgery  93  . Back surgery  06/01/2012  . Spine surgery      cervical fusion x 2  . Spine surgery  06-01-12    Lower Back SI     Current Outpatient Prescriptions  Medication Sig Dispense Refill  . alendronate (FOSAMAX) 70 MG tablet Take 70 mg by mouth every 7 (seven) days. Take with a full glass of water on an empty stomach. Take on mondays    .  aspirin 81 MG tablet Take 81 mg by mouth daily.    Marland Kitchen atorvastatin (LIPITOR) 40 MG tablet Take 80 mg by mouth daily.    Marland Kitchen BIOTIN PO Take 10,000 mcg by mouth daily.    . cetirizine (ZYRTEC) 10 MG tablet Take 10 mg by mouth daily.    . clonazePAM (KLONOPIN) 0.5 MG tablet Take 0.5 mg by mouth daily.    . cyclobenzaprine (FLEXERIL) 10 MG tablet Take 0.5-1 tablets (5-10 mg total) by mouth 3 (three) times daily as needed for muscle spasms. 50 tablet 1  . diclofenac (VOLTAREN) 75 MG EC tablet Take 75 mg by mouth at bedtime.     Marland Kitchen FLUoxetine (PROZAC) 20 MG capsule Take 60 mg by mouth daily.     . fluticasone (FLONASE) 50 MCG/ACT nasal spray Place 2 sprays into the nose daily.    Marland Kitchen gabapentin (NEURONTIN) 300 MG capsule Take 300 mg by mouth 3 (three) times daily.      Marland Kitchen lisinopril (PRINIVIL,ZESTRIL) 5 MG tablet Take 5 mg by mouth daily.    . metoprolol tartrate (LOPRESSOR) 25 MG tablet Take 25 mg by mouth 2 (two) times daily.    . Multiple Vitamins-Minerals (WOMENS MULTI VITAMIN & MINERAL) TABS Take 1 tablet by mouth daily.    . Olopatadine HCl (PATADAY) 0.2 % SOLN Place 1 drop into both eyes daily as needed. For allergies    . Omega-3 Fatty Acids (FISH OIL TRIPLE STRENGTH) 1400 MG CAPS Take 1,000 mg by mouth daily.     Marland Kitchen omeprazole (PRILOSEC) 20 MG capsule Take 20 mg by mouth 2 (two) times daily as needed. For reflux    . oxyCODONE-acetaminophen (PERCOCET/ROXICET) 5-325 MG per tablet Take 1-2 tablets by mouth every 4 (four) hours as needed for pain. 75 tablet 0  . rosuvastatin (CRESTOR) 10 MG tablet Take 10 mg by mouth daily.    . varenicline (CHANTIX) 0.5 MG tablet Take 0.5 mg by mouth 2 (two) times daily.     No current facility-administered medications for this visit.    Allergies:   Wellbutrin; Septra; Sulfa antibiotics; Zithromax; and Cymbalta    Social History:  The patient  reports that she has been smoking Cigarettes.  She has a 9 pack-year smoking history. She has never used smokeless tobacco. She reports that she drinks alcohol. She reports that she does not use illicit drugs.   Family History:  The patient's family history includes Asthma in her brother; Cancer in her maternal grandfather; Cancer (age of onset: 9) in her paternal grandmother; Deep vein thrombosis in her brother; Diabetes in her father; Heart Problems in her maternal grandmother; Heart attack in her maternal grandfather, paternal grandfather, and paternal uncle; Heart disease in her father, paternal grandfather, and paternal uncle; Hyperlipidemia in her mother; Hypertension in her mother; Seizures in her mother; Stroke in her maternal grandmother and mother.    ROS:  Please see the history of present illness.   Otherwise, review of systems are positive for overnight apnea,  daytime somnolence.   All other systems are reviewed and negative.    PHYSICAL EXAM: VS:  BP 154/80 mmHg  Pulse 72  Ht 5' (1.524 m)  Wt 87.952 kg (193 lb 14.4 oz)  BMI 37.87 kg/m2 , BMI Body mass index is 37.87 kg/(m^2). GENERAL:  Well appearing HEENT:  Pupils equal round and reactive, fundi not visualized, oral mucosa unremarkable NECK:  No jugular venous distention, waveform within normal limits, carotid upstroke brisk and symmetric, R carotid bruit, no  thyromegaly LYMPHATICS:  No cervical adenopathy LUNGS:  Clear to auscultation bilaterally HEART:  RRR.  PMI not displaced or sustained,S1 and S2 within normal limits, no S3, no S4, no clicks, no rubs, no murmurs ABD:  Flat, positive bowel sounds normal in frequency in pitch, no bruits, no rebound, no guarding, no midline pulsatile mass, no hepatomegaly, no splenomegaly EXT:  2 plus pulses throughout, no edema, no cyanosis no clubbing SKIN:  No rashes no nodules NEURO:  Cranial nerves II through XII grossly intact, motor grossly intact throughout PSYCH:  Cognitively intact, oriented to person place and time   EKG:  EKG is ordered today. The ekg ordered today demonstrates sinus rhythm at 72 bpm.  Prior septal infarct.   Recent Labs: No results found for requested labs within last 365 days.    Lipid Panel No results found for: CHOL, TRIG, HDL, CHOLHDL, VLDL, LDLCALC, LDLDIRECT   1/6/108/9/16:  Na 140, K 4.3, BUN 6, Creatinine 0.78 Chol 158, tri 133, HDL 45, LDL 87  Carotid ultrasound 05/2014: 40-59% L ICA stenosis.  Patent R carotid endarterectomy site.  Wt Readings from Last 3 Encounters:  05/21/15 87.952 kg (193 lb 14.4 oz)  01/22/14 81.194 kg (179 lb)  05/15/13 82.101 kg (181 lb)      ASSESSMENT AND PLAN:  # CAD s/p MI: Norma Scott had what sounds like was an NSTEMI.  We will obtain her records from this event.  She does not have any persistent chest pain.  She does report exertional dyspnea.  However, it is not clear  that this is due to coronary disease.  After obtaining the records we will decide whether to pursue stress vs. Cath.  For now, continue aspirin, atorvastatin, and metoprolol.  # PVD/TIAs: Norma Scott is scheduled to follow up with her vascular surgeon.  She continues to have episodes that are concerning for TIA.  We will obtain an MRI of the brain and MR-A of the neck.  She has a known moderate L common carotid artery lesion and is s/p CEA on the R.  Continue aspirin and atorvastatin.  # Hypertension: BP above goal.  She has not yet taken her BP medication today, therefore we will not make any adjustments at this time.  # Hyperlipidemia: Continue atorvastatin as above.   Current medicines are reviewed at length with the patient today.  The patient does not have concerns regarding medicines.  The following changes have been made:  no change  Labs/ tests ordered today include:   Orders Placed This Encounter  Procedures  . MR Brain W Wo Contrast  . MR Angiogram Neck W Contrast  . Basic metabolic panel  . EKG 12-Lead     Disposition:   FU with Colby Catanese C. Duke Salviaandolph, MD.  Date to be determined based on review of hospitalization records.   Signed, Madilyn Hookandolph, Nayden Czajka P, MD  05/21/2015 9:22 PM    [facility]

## 2015-05-21 NOTE — Patient Instructions (Signed)
Dr Duke Salviaandolph recommends that you have a MRI of your head and a MRA of your neck. These will be done at [facility].  Magnetic Resonance Angiogram Magnetic resonance imaging (MRI) is a test that lets your health care provider see detailed pictures of the inside of your body without using X-rays. Instead, strong magnets and radio waves work together in a Data processing managermagnetic field to form very detailed and sharp images. The images are viewed on a TV monitor in two- and three-dimensional form. The magnets and radio waves are harmless. Magnetic resonance angiogram, or magnetic resonance angiography (MRA), is an MRI done on your blood vessels. MRA is able to obtain detailed images of blood vessels and blood flow. It can be used to help diagnose and treat heart disorders, stroke, and blood vessel diseases. Contrast material may be injected to make MRA images even more clear. LET [facility] PROVIDER KNOW ABOUT:  Previous surgeries you have had.  Any metal you may have in your body. The magnet used in MRA can cause metal objects in your body to move. Metal can also make it hard to get high-quality images. Objects that contain metal include:  A pacemaker or any other implants, such as an implanted neurostimulator, a metallic ear implant, or a metallic object within the eye socket.  Metal splinters.  Any bullet fragments.  A port for delivering insulin or chemotherapy.  Any tattoos. Some red dyes contain iron which is sometimes a problem.  If you are pregnant or think you may be pregnant. It is best to avoid this test during the first three months of pregnancy unless there is a significant risk of missing a serious diagnosis without performing the test.  If you are breastfeeding.  If you are afraid of cramped spaces (claustrophobic). If claustrophobia is a problem, it usually can be relieved with mild sedatives or antianxiety medicines.  Any allergies you have.  All medicines you are taking,  including vitamins, herbs, eye drops, creams, and over-the-counter medicines. BEFORE THE PROCEDURE  If you are breastfeeding, prepare as directed by your health care provider. You may need to pump breast milk before the exam to give to your baby until the contrast material, if used, has cleared from your body.  You will be asked to remove anything containing metal, such as any watches or jewelry you are wearing. You may also be asked to remove your makeup because some makeup contains traces of metal. Braces and fillings normally are not a problem. PROCEDURE  You may be given earplugs or headphones to listen to music. The machine can be noisy.  A contrast material may be injected.  MRA is done in a long, magnetic chamber. You will lie down on a platform that slides into the magnetic chamber. Once inside, you will still be able to talk to the health care provider.  You will be asked to hold very still. The health care provider will tell you when you can shift position. You may have to wait a few minutes to make sure the images produced during the procedure are readable. AFTER THE PROCEDURE  You may resume normal activities right away.  If you were given contrast material, it will pass naturally through your body within a day.  A health care provider experienced in MRA will analyze the results and send a report to your health care provider, along with an interpretation of the findings.   This information is not intended to replace advice given to you by  your health care provider. Make sure you discuss any questions you have with your health care provider.   Document Released: 10/03/2003 Document Revised: 08/03/2014 Document Reviewed: 08/18/2013 Elsevier Interactive Patient Education 2016 [employer].  Magnetic Resonance Imaging Magnetic resonance imaging (MRI) is an imaging test that produces clear digital pictures of the inside of your body without using X-rays. The MRI scanner uses  radio waves and a magnetic field to create the images. The MRI pictures may provide different details than images obtained through X-rays, CT scans, or ultrasounds. Contrast material may be injected to make MRI images even more clear. In a standard MRI scanner, the area of your body being studied will be in the center opening of the scanner. In open MRI scanners, the scanner does not entirely surround your body.  LET [facility] CARE PROVIDER KNOW ABOUT:  Previous surgeries you have had.  Any metal you may have in your body. The magnet used in MRI can cause metal objects in your body to move. This includes:  A pacemaker or any other implants, such as an implanted neurostimulator, a metallic ear implant, or a metallic object within the eye socket.  Metal splinters in your body.  Any bullet fragments.  A port for delivering insulin or chemotherapy.  Any tattoos. Some red dyes contain iron which is sometimes a problem.  If you are pregnant or think you may be pregnant.  If you are breastfeeding.  If you are afraid of cramped spaces (claustrophobic). If claustrophobia is a problem, it usually can be relieved with medicines or the use of the open MRI scanner.  Any allergies you have.  All medicines you are taking, including vitamins, herbs, eye drops, creams, and over-the-counter medicines. RISKS AND COMPLICATIONS  Generally, MRI is a safe procedure. However, problems can occur and include:  If a metal implant is present but is undetected, it may be affected by the strong magnetic field. In addition, if the implant is close to the examination site, it may be hard to get high-quality images.  If you are pregnant:  MRI generally should be avoided during the first three months of pregnancy. It is not known what effects the MRI may have on a fetus. Ultrasound is preferred at this time unless a serious condition is suspected that is best studied by MRI. MRI should be considered if there is a  substantial risk of missing the correct diagnosis if MRI is not done.  If you are breastfeeding:  You should inform your health care provider and ask how to proceed. You may pump breast milk before the exam for use until the contrast material, if used, has cleared from the body. BEFORE THE PROCEDURE   You will be asked to remove all metal, including:  Your watch, jewelry, and other metal objects.  Some makeup also contains traces of metal and may need to be removed.  Braces and fillings normally are not a problem. PROCEDURE  You may be given earplugs or headphones to listen to music. The MRI scanner can be noisy.  You may be injected with contrast material.  The standard MRI is done in a long, magnetic chamber. You will lie down on a platform that slides into the magnetic chamber. Once inside, you will still be able to talk to the person performing the test. The open MRI scanner is open on at least one side of the scanner.  You will be asked to hold very still. You will be told when you can  shift position. You may have to wait a few minutes to make sure the images are readable. AFTER THE PROCEDURE   You may resume normal activities right away.  If you were given contrast material, it will pass naturally through your body within a day.  A person experienced in MRI (radiologist) will analyze the results and send a report to your health care provider, along with an explanation of the results.   This information is not intended to replace advice given to you by your health care provider. Make sure you discuss any questions you have with your health care provider.   Document Released: 07/10/2000 Document Revised: 08/03/2014 Document Reviewed: 09/07/2013 Elsevier Interactive Patient Education [employer]

## 2015-05-22 ENCOUNTER — Telehealth: Payer: Self-pay | Admitting: Cardiovascular Disease

## 2015-05-22 NOTE — Telephone Encounter (Signed)
Faxed signed Release to obtain [facility] per Dr Leonides Sakeandolph's request.  Faxed on 05/22/15  (Fax [phone])

## 2015-05-23 ENCOUNTER — Telehealth: Payer: Self-pay | Admitting: Cardiovascular Disease

## 2015-05-23 NOTE — Telephone Encounter (Signed)
[facility] as requested by Dr Duke Salviaandolph.  Records given to Dr Duke Salviaandolph for review.  lp

## 2015-05-30 ENCOUNTER — Encounter: Payer: Self-pay | Admitting: Cardiovascular Disease

## 2015-05-31 DIAGNOSIS — I779 Disorder of arteries and arterioles, unspecified: Secondary | ICD-10-CM | POA: Diagnosis not present

## 2015-05-31 DIAGNOSIS — Z79899 Other long term (current) drug therapy: Secondary | ICD-10-CM | POA: Diagnosis not present

## 2015-05-31 DIAGNOSIS — G459 Transient cerebral ischemic attack, unspecified: Secondary | ICD-10-CM | POA: Diagnosis not present

## 2015-06-01 LAB — BASIC METABOLIC PANEL
BUN: 9 mg/dL (ref 7–25)
CALCIUM: 8.9 mg/dL (ref 8.6–10.4)
CO2: 27 mmol/L (ref 20–31)
CREATININE: 0.73 mg/dL (ref 0.50–0.99)
Chloride: 99 mmol/L (ref 98–110)
Glucose, Bld: 139 mg/dL — ABNORMAL HIGH (ref 65–99)
Potassium: 4 mmol/L (ref 3.5–5.3)
Sodium: 138 mmol/L (ref 135–146)

## 2015-06-03 ENCOUNTER — Telehealth: Payer: Self-pay | Admitting: *Deleted

## 2015-06-03 NOTE — Telephone Encounter (Signed)
Left message to call back labs

## 2015-06-03 NOTE — Telephone Encounter (Signed)
Spoke to patient. Result given . Verbalized understanding  

## 2015-06-03 NOTE — Telephone Encounter (Signed)
-----   Message from Chilton Siiffany Monteb[place], MD sent at 06/03/2015  8:11 AM EST ----- Renal function and electrolytes are normal.

## 2015-06-04 DIAGNOSIS — K839 Disease of biliary tract, unspecified: Secondary | ICD-10-CM | POA: Diagnosis not present

## 2015-06-05 ENCOUNTER — Ambulatory Visit (HOSPITAL_COMMUNITY): Payer: Medicare Other

## 2015-06-05 ENCOUNTER — Ambulatory Visit (HOSPITAL_COMMUNITY)
Admission: RE | Admit: 2015-06-05 | Discharge: 2015-06-05 | Disposition: A | Discharge status: Home / Self Care | Payer: Medicare Other | Source: Ambulatory Visit | Attending: Cardiovascular Disease | Admitting: Cardiovascular Disease

## 2015-06-05 DIAGNOSIS — R9082 White matter disease, unspecified: Secondary | ICD-10-CM | POA: Insufficient documentation

## 2015-06-05 DIAGNOSIS — I779 Disorder of arteries and arterioles, unspecified: Secondary | ICD-10-CM

## 2015-06-05 DIAGNOSIS — I6529 Occlusion and stenosis of unspecified carotid artery: Secondary | ICD-10-CM | POA: Insufficient documentation

## 2015-06-05 DIAGNOSIS — G459 Transient cerebral ischemic attack, unspecified: Secondary | ICD-10-CM | POA: Insufficient documentation

## 2015-06-05 DIAGNOSIS — I6523 Occlusion and stenosis of bilateral carotid arteries: Secondary | ICD-10-CM | POA: Diagnosis not present

## 2015-06-05 DIAGNOSIS — I739 Peripheral vascular disease, unspecified: Secondary | ICD-10-CM

## 2015-06-05 MED ORDER — GADOBENATE DIMEGLUMINE 529 MG/ML IV SOLN
15.0000 mL | Freq: Once | INTRAVENOUS | Status: AC | PRN
  Administered 2015-06-05: 15 mL via INTRAVENOUS

## 2015-06-28 ENCOUNTER — Telehealth: Payer: Self-pay | Admitting: *Deleted

## 2015-06-28 DIAGNOSIS — I739 Peripheral vascular disease, unspecified: Secondary | ICD-10-CM

## 2015-06-28 DIAGNOSIS — I779 Disorder of arteries and arterioles, unspecified: Secondary | ICD-10-CM

## 2015-06-28 DIAGNOSIS — G459 Transient cerebral ischemic attack, unspecified: Secondary | ICD-10-CM

## 2015-06-28 NOTE — Telephone Encounter (Signed)
Spoke to patient. Result given . Verbalized understanding Aware of referral for neurology following up MRI

## 2015-06-28 NOTE — Telephone Encounter (Signed)
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  This could be related to the carotid artery that has already been revascularized.  There was also small vessel disease in her brain.  She has mild-moderate blockages in both carotid arteries.  Continue vascular follow up as planned.  Referral to Neurology for TIAs and prior stroke.

## 2015-07-01 ENCOUNTER — Telehealth: Payer: Self-pay | Admitting: Cardiovascular Disease

## 2015-07-01 NOTE — Telephone Encounter (Signed)
Called patient. She notes she had an event yesterday of double/blurred vision, facial numbness, and sharp BP elevation (~190/100. She did not give me baseline "normal" BP value but notes it went down).  911 was called. [facility] came to house. By the time of EMS arrival, pt was no longer experiencing symptoms and declined further eval/hospital transport.  She called PCP office today who recommended hospital eval if repeat event. I concurred w/ this.  FAST protocol discussed. Pt elicited understanding of recommendations.  She notes her neurology consult which was originally slated for January has been moved up and she is set to see a provider next week.  i acknowledged, advised hospital evaluation still stands in interim. Pt voiced understanding & agreement.  Pt aware I will route to Dr. Duke Salviaandolph for her information.

## 2015-07-01 NOTE — Telephone Encounter (Signed)
I agree.  [name] needs to be seen in the ED.

## 2015-07-01 NOTE — Telephone Encounter (Signed)
New message     Calling to make Dr Duke Salviaandolph aware that pt had another mini stroke yesterday.  The EMS came out.  She did not go to the hospital.

## 2015-07-09 ENCOUNTER — Encounter: Payer: Self-pay | Admitting: Neurology

## 2015-07-09 ENCOUNTER — Ambulatory Visit (INDEPENDENT_AMBULATORY_CARE_PROVIDER_SITE_OTHER): Payer: Medicare Other | Admitting: Neurology

## 2015-07-09 VITALS — BP 100/76 | HR 75 | Ht 60.0 in | Wt 194.0 lb

## 2015-07-09 DIAGNOSIS — I2510 Atherosclerotic heart disease of native coronary artery without angina pectoris: Secondary | ICD-10-CM

## 2015-07-09 DIAGNOSIS — R932 Abnormal findings on diagnostic imaging of liver and biliary tract: Secondary | ICD-10-CM | POA: Diagnosis not present

## 2015-07-09 DIAGNOSIS — G459 Transient cerebral ischemic attack, unspecified: Secondary | ICD-10-CM | POA: Diagnosis not present

## 2015-07-09 MED ORDER — CLOPIDOGREL BISULFATE 75 MG PO TABS: 75.0000 mg | ORAL_TABLET | Freq: Every day | ORAL | Status: DC

## 2015-07-09 NOTE — Patient Instructions (Signed)
1.  Remain on the aspirin for now.  Start Plavix 75mg  daily.  2.  Continue Lipitor 40mg  daily.  Will check fasting lipid panel to determine if we need to change dose 3.  [place] see [place].  You may need to remain on aspirin for your heart.  I would ask Dr. Duke Salviaandolph.  Otherwise, you may discontinue aspirin in 3 months and just remain on Plavix.  But I would ask Dr. Duke Salviaandolph. 5.  Follow up in 3 months.

## 2015-07-09 NOTE — Progress Notes (Signed)
Chart forwarded.  

## 2015-07-09 NOTE — Progress Notes (Addendum)
NEUROLOGY CONSULTATION NOTE  Norma Scott MRN: [id number] DOB: Jun 23, 1951  Referring provider: Dr. Duke Salvia Primary care provider: Dr. Katrinka Blazing  Reason for consult:  TIA  HISTORY OF PRESENT ILLNESS: Norma Scott is a 64 year old female with carotid artery disease status post right CEA, hyperlipidemia, hypertension, and tobacco abuse who presents for TIA.  History obtained by patient and cardiology notes.  EKG, labs and imaging of brain MRI and neck MRA reviewed.  Earlier this year, she had a TIA.  She presented with slurred speech, double vision and left facial numbness.  It lasted a few minutes and resolved.  It was associated with headache and elevated blood pressure.  She underwent a TIA workup.  EKG from 05/21/15 showed NSR of 72 bpm.  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  MRA of the neck showed only mild atherosclerotic changes at the carotid bifurcations without hemodynamically significant ICA stenosis in the right ICA and less than 50% stenosis in the left ICA.  On 07/01/15, she had a similar episode of left facial numbness and double vision.  Blood pressure was reportedly 190/100.  Symptoms lasted a few minutes and resolved by the time EMS arrived.  She declined going to the ED.  Over the summer, she was hospitalized in [place], [place] for cholecystitis.  She underwent cholecystectomy.  While in the hospital, she was told that she suffered a mild heart attack.  ASA  was increased to .  LDL in August was 87.  Current medications include:  ASA , Lisinopril, metoprolol, alendronate, atorvastatin , gabapentin /300mg /600mg   She underwent right carotid endarterectomy several years ago.  She has routine follow up visits with vascular surgery.    PAST MEDICAL  HISTORY: Past Medical History  Diagnosis Date  . Carotid artery occlusion   . Fibromyalgia   . Esophageal reflux   . Asthma   . Hyperlipidemia   . Gall stones   . Cervical disc displacement     x2  . Lumbar disc herniation     L5-S1  . Allergy     allergic rhinitis  . COPD (chronic obstructive pulmonary disease) (HCC)   . Generalized anxiety disorder   . CAD (coronary artery disease)     patient says no problems   dr Williemae Natter smith pcp  . TIA (transient ischemic attack) 05/21/2015  . Essential hypertension 05/21/2015  . Arteriosclerotic coronary artery disease 05/21/2015    PAST SURGICAL HISTORY: Past Surgical History  Procedure Laterality Date  . Carotid endarterectomy  02/01/2009    Right  . Tubal ligation    . Cervical fusion      1995, 1996  . Ectopic pregnancy surgery  93  . Back surgery  06/01/2012    lumbar  . Cholecystectomy      MEDICATIONS: Current Outpatient Prescriptions on File Prior to Visit  Medication Sig Dispense Refill  . alendronate (FOSAMAX) 70 MG tablet Take 70 mg by mouth every 7 (seven) days. Take with a full glass of water on an empty stomach. Take on mondays    . aspirin 81 MG tablet Take 81 mg by mouth daily.    Marland Kitchen atorvastatin (LIPITOR) 40 MG tablet Take 80 mg by mouth daily.    . clonazePAM (KLONOPIN) 0.5 MG tablet Take 0.5 mg by mouth 2 (two) times daily.     Marland Kitchen  cyclobenzaprine (FLEXERIL) 10 MG tablet Take 0.5-1 tablets (5-10 mg total) by mouth 3 (three) times daily as needed for muscle spasms. 50 tablet 1  . FLUoxetine (PROZAC) 20 MG capsule Take 60 mg by mouth daily.     Marland Kitchen. gabapentin (NEURONTIN) 300 MG capsule Take 300 mg by mouth 3 (three) times daily.    Marland Kitchen. lisinopril (PRINIVIL,ZESTRIL) 5 MG tablet Take 5 mg by mouth daily.    . metoprolol tartrate (LOPRESSOR) 25 MG tablet Take 25 mg by mouth 2 (two) times daily.    Marland Kitchen. omeprazole (PRILOSEC) 20 MG capsule Take 20 mg by mouth 2 (two) times daily as needed. For reflux     No current  facility-administered medications on file prior to visit.    ALLERGIES: Allergies  Allergen Reactions  . Wellbutrin [Bupropion] Other (See Comments)    Hallucinations  . Septra [Sulfamethoxazole-Trimethoprim] Nausea And Vomiting    Vomiting  . Sulfa Antibiotics Nausea And Vomiting  . Zithromax [Azithromycin] Hives  . Cymbalta [Duloxetine Hcl]     fatigue    FAMILY HISTORY: Family History  Problem Relation Age of Onset  . Stroke Mother   . Hypertension Mother   . Hyperlipidemia Mother   . Seizures Mother     Grand mal seizures  . Diabetes Father   . Heart disease Father     Heart Disease before age 10560  . Heart disease Paternal Uncle   . Heart attack Paternal Uncle   . Stroke Maternal Grandmother   . Heart Problems Maternal Grandmother   . Cancer Maternal Grandfather     stomach cancer  . Heart attack Maternal Grandfather   . Heart disease Paternal Grandfather   . Heart attack Paternal Grandfather   . Deep vein thrombosis Brother   . Asthma Brother   . Cancer Paternal Grandmother 1947    SOCIAL HISTORY: Social History   Social History  . Marital Status: Legally Separated    Spouse Name: N/A  . Number of Children: N/A  . Years of Education: N/A   Occupational History  . Not on file.   Social History Main Topics  . Smoking status: Current Some Day Smoker -- 0.20 packs/day for 45 years    Types: Cigarettes  . Smokeless tobacco: Never Used     Comment: pt states she only smokes about 2-3 cigs per day  . Alcohol Use: 0.0 oz/week    0 Standard drinks or equivalent per week     Comment: 2 times a year  . Drug Use: No  . Sexual Activity: Not on file   Other Topics Concern  . Not on file   Social History Narrative   Epworth Sleepiness Scale = 10 (as of 05/21/2015)    REVIEW OF SYSTEMS: Constitutional: No fevers, chills, or sweats, no generalized fatigue, change in appetite Eyes: No visual changes, double vision, eye pain Ear, nose and throat: No hearing  loss, ear pain, nasal congestion, sore throat Cardiovascular: No chest pain, palpitations Respiratory:  No shortness of breath at rest or with exertion, wheezes GastrointestinaI: Nausea  Genitourinary:  No dysuria, urinary retention or frequency Musculoskeletal:  No neck pain, back pain Integumentary: No rash, pruritus, skin lesions Neurological: as above Psychiatric: No depression, insomnia, anxiety Endocrine: No palpitations, fatigue, diaphoresis, mood swings, change in appetite, change in weight, increased thirst Hematologic/Lymphatic:  No anemia, purpura, petechiae. Allergic/Immunologic: no itchy/runny eyes, nasal congestion, recent allergic reactions, rashes  PHYSICAL EXAM: Filed Vitals:   07/09/15 1231  BP: 100/76  Pulse:  75   General: No acute distress.  Patient appears well-groomed. Head:  Normocephalic/atraumatic Eyes:  fundi unremarkable, without vessel changes, exudates, hemorrhages or papilledema. Neck: supple, no paraspinal tenderness, full range of motion Back: No paraspinal tenderness Heart: regular rate and rhythm Lungs: Clear to auscultation bilaterally. Vascular: No carotid bruits. Neurological Exam: Mental status: alert and oriented to person, place, and time, recent and remote memory intact, fund of knowledge intact, attention and concentration intact, speech fluent and not dysarthric, language intact. Cranial nerves: CN I: not tested CN II: pupils equal, round and reactive to light, visual fields intact, fundi unremarkable, without vessel changes, exudates, hemorrhages or papilledema. CN III, IV, VI:  full range of motion, no nystagmus, no ptosis CN V: facial sensation intact CN VII: upper and lower face symmetric CN VIII: hearing intact CN IX, X: gag intact, uvula midline CN XI: sternocleidomastoid and trapezius muscles intact CN XII: tongue midline Bulk & Tone: normal, no fasciculations. Motor:  5/5 throughout  Sensation:  Temperature and vibration  sensation intact. . Deep Tendon Reflexes:  2+ throughout, toes downgoing.  Finger to nose testing:  Without dysmetria.  Heel to shin:  Without dysmetria.  Gait:  Antalgic gait.  Able to turn and tandem walk. Romberg negative.  IMPRESSION: Transient ischemic attack Tobacco abuse HTN  PLAN: 1.  She is already on ASA.  We will start Plavix  daily.  I would have her on dual antiplatelet therapy for 3 months.  At that time, she may discontinue ASA and remain on Plavix (unless she needs to remain on ASA for cardiac reason.  I will defer to Dr. Duke Salvia). 2.  Continue Lipitor  daily.  Recheck fasting lipid panel (LDL goal should be less than 70) 3.  Blood pressure control 4.  Smoking cessation 5.  Will check to see if 2D echo was performed while in hospital in [place].  ADDENDUM:  She had a 2D echo performed in [place] (01/19/15).  EF 55-60% with no gross wall motion abnormalities.  Thank you for allowing me to take part in the care of this patient.  Shon Millet, DO  CC:  Chilton Si, MD  Merri Brunette, MD

## 2015-07-15 ENCOUNTER — Other Ambulatory Visit: Payer: Self-pay | Admitting: Gastroenterology

## 2015-07-15 DIAGNOSIS — R932 Abnormal findings on diagnostic imaging of liver and biliary tract: Secondary | ICD-10-CM

## 2015-07-18 ENCOUNTER — Other Ambulatory Visit (INDEPENDENT_AMBULATORY_CARE_PROVIDER_SITE_OTHER): Payer: Medicare Other

## 2015-07-18 DIAGNOSIS — G459 Transient cerebral ischemic attack, unspecified: Secondary | ICD-10-CM

## 2015-07-18 LAB — LIPID PANEL
Cholesterol: 129 mg/dL (ref 0–200)
HDL: 41.4 mg/dL (ref >39.00)
LDL CALC: 67 mg/dL (ref 0–99)
NONHDL: 87.74
Total CHOL/HDL Ratio: 3
Triglycerides: 106 mg/dL (ref 0.0–149.0)
VLDL: 21.2 mg/dL (ref 0.0–40.0)

## 2015-07-23 ENCOUNTER — Telehealth: Payer: Self-pay

## 2015-07-23 NOTE — Telephone Encounter (Signed)
Left message on machine for pt to return call to the office.  

## 2015-07-23 NOTE — Telephone Encounter (Signed)
-----   Message from Drema DallasAdam R Jaffe, DO sent at 07/18/2015  7:23 PM EST ----- Cholesterol looks good.  Continue Lipitor at current dose.

## 2015-07-26 ENCOUNTER — Ambulatory Visit
Admission: RE | Admit: 2015-07-26 | Discharge: 2015-07-26 | Disposition: A | Discharge status: Home / Self Care | Payer: Medicare Other | Source: Ambulatory Visit | Attending: Gastroenterology | Admitting: Gastroenterology

## 2015-07-26 DIAGNOSIS — R109 Unspecified abdominal pain: Secondary | ICD-10-CM | POA: Diagnosis not present

## 2015-07-26 DIAGNOSIS — R932 Abnormal findings on diagnostic imaging of liver and biliary tract: Secondary | ICD-10-CM

## 2015-07-26 MED ORDER — GADOBENATE DIMEGLUMINE 529 MG/ML IV SOLN
18.0000 mL | Freq: Once | INTRAVENOUS | Status: AC | PRN
  Administered 2015-07-26: 18 mL via INTRAVENOUS

## 2015-08-02 ENCOUNTER — Ambulatory Visit: Payer: Medicare Other | Admitting: Neurology

## 2015-08-05 ENCOUNTER — Telehealth: Payer: Self-pay | Admitting: Cardiovascular Disease

## 2015-08-05 NOTE — Telephone Encounter (Signed)
Outside records were reviewed from [facility] [place].    [name] was evaluated in the emergency department for nausea, vomiting and right upper quadrant discomfort in June 2016.  She had a chest CT that showed coronary calcifications. Cardiac enzymes were cycled and she had a non-ST elevation MI. Troponin peaked at 0.16. She was also noted to have gallbladder distention and gallstones.  Echocardiography revealed an ejection fraction of 55% and no wall motion abnormalities.Given that she did not have any chest pain this was felt to be due to demand ischemia. She subsequently underwent laparoscopic cholecystectomy without complication.Her cholang It was recommended that she follow-up with GI and likely undergo ERCP. She was also recommended to carp follow-up with cardiology regarding her demand ischemia.  Echo 01/19/15: LVEF 55-60%. Grade 1 diastolic dysfunction. Mild aortic valve sclerosiswithout stenosis. Trace mitral regurgitation. Trace tricuspid regurgitation. Trace ulmonic regurgitation. A fat pad was noted in the pericardium. Lipomatous hypertrophy of the intra-atrial septum.

## 2015-09-23 DIAGNOSIS — I6521 Occlusion and stenosis of right carotid artery: Secondary | ICD-10-CM | POA: Diagnosis not present

## 2015-09-23 DIAGNOSIS — I10 Essential (primary) hypertension: Secondary | ICD-10-CM | POA: Diagnosis not present

## 2015-09-23 DIAGNOSIS — I739 Peripheral vascular disease, unspecified: Secondary | ICD-10-CM | POA: Diagnosis not present

## 2015-09-23 DIAGNOSIS — R609 Edema, unspecified: Secondary | ICD-10-CM | POA: Diagnosis not present

## 2015-09-23 DIAGNOSIS — K219 Gastro-esophageal reflux disease without esophagitis: Secondary | ICD-10-CM | POA: Diagnosis not present

## 2015-09-23 DIAGNOSIS — M797 Fibromyalgia: Secondary | ICD-10-CM | POA: Diagnosis not present

## 2015-09-23 DIAGNOSIS — E7800 Pure hypercholesterolemia, unspecified: Secondary | ICD-10-CM | POA: Diagnosis not present

## 2015-09-23 DIAGNOSIS — F411 Generalized anxiety disorder: Secondary | ICD-10-CM | POA: Diagnosis not present

## 2015-09-23 DIAGNOSIS — F17210 Nicotine dependence, cigarettes, uncomplicated: Secondary | ICD-10-CM | POA: Diagnosis not present

## 2015-09-26 ENCOUNTER — Telehealth: Payer: Self-pay | Admitting: Family

## 2015-09-26 ENCOUNTER — Other Ambulatory Visit: Payer: Self-pay | Admitting: Gastroenterology

## 2015-09-26 NOTE — Telephone Encounter (Signed)
Rcvd VM from [place] asking to r.s appts. I have LM for her to call back to reschedule, dpm

## 2015-10-02 ENCOUNTER — Ambulatory Visit (HOSPITAL_COMMUNITY): Admission: RE | Admit: 2015-10-02 | Payer: Medicare Other | Source: Ambulatory Visit | Admitting: Gastroenterology

## 2015-10-02 ENCOUNTER — Encounter (HOSPITAL_COMMUNITY): Admission: RE | Payer: Self-pay | Source: Ambulatory Visit

## 2015-10-02 SURGERY — UPPER ENDOSCOPIC ULTRASOUND (EUS) RADIAL
Anesthesia: Monitor Anesthesia Care

## 2015-10-31 DIAGNOSIS — K838 Other specified diseases of biliary tract: Secondary | ICD-10-CM | POA: Diagnosis not present

## 2015-10-31 DIAGNOSIS — Z1211 Encounter for screening for malignant neoplasm of colon: Secondary | ICD-10-CM | POA: Diagnosis not present

## 2015-11-07 ENCOUNTER — Ambulatory Visit (INDEPENDENT_AMBULATORY_CARE_PROVIDER_SITE_OTHER): Payer: Medicare Other | Admitting: Neurology

## 2015-11-07 ENCOUNTER — Encounter: Payer: Self-pay | Admitting: Neurology

## 2015-11-07 VITALS — BP 142/76 | HR 78 | Ht 60.0 in | Wt 204.0 lb

## 2015-11-07 DIAGNOSIS — E785 Hyperlipidemia, unspecified: Secondary | ICD-10-CM

## 2015-11-07 DIAGNOSIS — G459 Transient cerebral ischemic attack, unspecified: Secondary | ICD-10-CM | POA: Diagnosis not present

## 2015-11-07 DIAGNOSIS — I10 Essential (primary) hypertension: Secondary | ICD-10-CM

## 2015-11-07 DIAGNOSIS — I779 Disorder of arteries and arterioles, unspecified: Secondary | ICD-10-CM | POA: Diagnosis not present

## 2015-11-07 DIAGNOSIS — I739 Peripheral vascular disease, unspecified: Secondary | ICD-10-CM

## 2015-11-07 DIAGNOSIS — F17200 Nicotine dependence, unspecified, uncomplicated: Secondary | ICD-10-CM

## 2015-11-07 NOTE — Progress Notes (Signed)
Chart forwarded.  

## 2015-11-07 NOTE — Patient Instructions (Addendum)
1.  Continue Plavix 75mg  daily.  I told your GI doctor that for the procedure, Plavix should be discontinued 5 days prior to procedure and may be restarted the next day following procedure.  Stopping Plavix will increase risk for stroke, however. 2.  Stop smoking 3.  Continue blood pressure medication and Lipitor 4.  Exercise 5.  Mediterranean diet    Why follow it? Research shows. . Those who follow the Mediterranean diet have a reduced risk of heart disease  . The diet is associated with a reduced incidence of Parkinson's and Alzheimer's diseases . People following the diet may have longer life expectancies and lower rates of chronic diseases  . The Dietary Guidelines for Americans recommends the Mediterranean diet as an eating plan to promote health and prevent disease  What Is the Mediterranean Diet?  . Healthy eating plan based on typical foods and recipes of Mediterranean-style cooking . The diet is primarily a plant based diet; these foods should make up a majority of meals   Starches - Plant based foods should make up a majority of meals - They are an important sources of vitamins, minerals, energy, antioxidants, and fiber - Choose whole grains, foods high in fiber and minimally processed items  - Typical grain sources include wheat, oats, barley, corn, brown rice, bulgar, farro, millet, polenta, couscous  - Various types of beans include chickpeas, lentils, fava beans, black beans, white beans   Fruits  Veggies - Large quantities of antioxidant rich fruits & veggies; 6 or more servings  - Vegetables can be eaten raw or lightly drizzled with oil and cooked  - Vegetables common to the traditional Mediterranean Diet include: artichokes, arugula, beets, broccoli, brussel sprouts, cabbage, carrots, celery, collard greens, cucumbers, eggplant, kale, leeks, lemons, lettuce, mushrooms, okra, onions, peas, peppers, potatoes, pumpkin, radishes, rutabaga, shallots, spinach, sweet potatoes,  turnips, zucchini - Fruits common to the Mediterranean Diet include: apples, apricots, avocados, cherries, clementines, dates, figs, grapefruits, grapes, melons, nectarines, oranges, peaches, pears, pomegranates, strawberries, tangerines  Fats - Replace butter and margarine with healthy oils, such as olive oil, canola oil, and tahini  - Limit nuts to no more than a handful a day  - Nuts include walnuts, almonds, pecans, pistachios, pine nuts  - Limit or avoid candied, honey roasted or heavily salted nuts - Olives are central to the [employer]iterranean diet - can be eaten whole or used in a variety of dishes   Meats Protein - Limiting red meat: no more than a few times a month - When eating red meat: choose lean cuts and keep the portion to the size of deck of cards - Eggs: approx. 0 to 4 times a week  - Fish and lean poultry: at least 2 a week  - Healthy protein sources include, chicken, [place], lean beef, lamb - Increase intake of seafood such as tuna, salmon, trout, mackerel, shrimp, scallops - Avoid or limit high fat processed meats such as sausage and bacon  Dairy - Include moderate amounts of low fat dairy products  - Focus on healthy dairy such as fat free yogurt, skim milk, low or reduced fat cheese - Limit dairy products higher in fat such as whole or 2% milk, cheese, ice cream  Alcohol - Moderate amounts of red wine is ok  - No more than 5 oz daily for women (all ages) and men older than age 64  - No more than 10 oz of wine daily for men younger than 3065  Other -  Limit sweets and other desserts  - Use herbs and spices instead of salt to flavor foods  - Herbs and spices common to the traditional Mediterranean Diet include: basil, bay leaves, chives, cloves, cumin, fennel, garlic, lavender, marjoram, mint, oregano, parsley, pepper, rosemary, sage, savory, sumac, tarragon, thyme   It's not just a diet, it's a lifestyle:  . The Mediterranean diet includes lifestyle factors typical of those in  the region  . Foods, drinks and meals are best eaten with others and savored . Daily physical activity is important for overall good health . This could be strenuous exercise like running and aerobics . This could also be more leisurely activities such as walking, housework, yard-work, or taking the stairs . Moderation is the key; a balanced and healthy diet accommodates most foods and drinks . Consider portion sizes and frequency of consumption of certain foods   Meal Ideas & Options:  . Breakfast:  o Whole wheat toast or whole wheat English muffins with peanut butter & hard boiled egg o Steel cut oats topped with apples & cinnamon and skim milk  o Fresh fruit: banana, strawberries, melon, berries, peaches  o Smoothies: strawberries, bananas, greek yogurt, peanut butter o Low fat greek yogurt with blueberries and granola  o Egg white omelet with spinach and mushrooms o Breakfast couscous: whole wheat couscous, apricots, skim milk, cranberries  . Sandwiches:  o Hummus and grilled vegetables (peppers, zucchini, squash) on whole wheat bread   o Grilled chicken on whole wheat pita with lettuce, tomatoes, cucumbers or tzatziki  o Tuna salad on whole wheat bread: tuna salad made with greek yogurt, olives, red peppers, capers, green onions o Garlic rosemary lamb pita: lamb sauted with garlic, rosemary, salt & pepper; add lettuce, cucumber, greek yogurt to pita - flavor with lemon juice and black pepper  . Seafood:  o Mediterranean grilled salmon, seasoned with garlic, basil, parsley, lemon juice and black pepper o Shrimp, lemon, and spinach whole-grain pasta salad made with low fat greek yogurt  o Seared scallops with lemon orzo  o Seared tuna steaks seasoned salt, pepper, coriander topped with tomato mixture of olives, tomatoes, olive oil, minced garlic, parsley, green onions and cappers  . Meats:  o Herbed greek chicken salad with kalamata olives, cucumber, feta  o Red bell peppers stuffed  with spinach, bulgur, lean ground beef (or lentils) & topped with feta   o Kebabs: skewers of chicken, tomatoes, onions, zucchini, squash  o [place] burgers: made with red onions, mint, dill, lemon juice, feta cheese topped with roasted red peppers . Vegetarian o Cucumber salad: cucumbers, artichoke hearts, celery, red onion, feta cheese, tossed in olive oil & lemon juice  o Hummus and whole grain pita points with a greek salad (lettuce, tomato, feta, olives, cucumbers, red onion) o Lentil soup with celery, carrots made with vegetable broth, garlic, salt and pepper  o Tabouli salad: parsley, bulgur, mint, scallions, cucumbers, tomato, radishes, lemon juice, olive oil, salt and pepper. 6.  Follow up with vascular surgeon for evaluation of carotid arteries 7.  Follow up in one year

## 2015-11-07 NOTE — Progress Notes (Signed)
NEUROLOGY FOLLOW UP OFFICE NOTE  ELLANA KAWA [id number]  HISTORY OF PRESENT ILLNESS: Norma Scott is a 65 year old female with carotid artery disease status post right CEA, hyperlipidemia, hypertension, and tobacco abuse who follows up for TIA.  UPDATE: She was on dual antiplatelet therapy for 3 months, at which point, she remained only on Plavix.  LDL from 07/18/15 was 67.  On 11/21/14, she is scheduled to undergo polyp removal  HISTORY: Last year, she had a TIA.  She presented with slurred speech, double vision and left facial numbness.  It lasted a few minutes and resolved.  It was associated with headache and elevated blood pressure.  She underwent a TIA workup.  2D echo from 01/19/15 showed EF 55-60% with no gross wall motion abnormalities.  EKG from 05/21/15 showed NSR of 72 bpm.  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  MRA of the neck showed only mild atherosclerotic changes at the carotid bifurcations without hemodynamically significant ICA stenosis in the right ICA and less than 50% stenosis in the left ICA.  On 07/01/15, she had a similar episode of left facial numbness and double vision.  Blood pressure was reportedly 190/100.  Symptoms lasted a few minutes and resolved by the time EMS arrived.  She declined going to the ED.  Over the summer, she was hospitalized in [place], [place] for cholecystitis.  She underwent cholecystectomy.  While in the hospital, she was told that she suffered a mild heart attack.  ASA  was increased to .  LDL in August was 87.  Current medications include:  ASA , Lisinopril, metoprolol, alendronate, atorvastatin , gabapentin /300mg /600mg   She underwent right carotid endarterectomy several years ago.  She has routine follow up visits  with vascular surgery.   PAST MEDICAL HISTORY: Past Medical History  Diagnosis Date  . Carotid artery occlusion   . Fibromyalgia   . Esophageal reflux   . Asthma   . Hyperlipidemia   . Gall stones   . Cervical disc displacement     x2  . Lumbar disc herniation     L5-S1  . Allergy     allergic rhinitis  . COPD (chronic obstructive pulmonary disease) (HCC)   . Generalized anxiety disorder   . CAD (coronary artery disease)     patient says no problems   dr Williemae Natter smith pcp  . TIA (transient ischemic attack) 05/21/2015  . Essential hypertension 05/21/2015  . Arteriosclerotic coronary artery disease 05/21/2015    MEDICATIONS: Current Outpatient Prescriptions on File Prior to Visit  Medication Sig Dispense Refill  . aspirin 81 MG tablet Take 81 mg by mouth daily.    Marland Kitchen atorvastatin (LIPITOR) 40 MG tablet Take 80 mg by mouth daily.    . clonazePAM (KLONOPIN) 0.5 MG tablet Take 0.5 mg by mouth 2 (two) times daily.     . clopidogrel (PLAVIX) 75 MG tablet Take 1 tablet (75 mg total) by mouth daily. 30 tablet 4  . cyclobenzaprine (FLEXERIL) 10 MG tablet Take 0.5-1 tablets (5-10 mg total) by mouth 3 (three) times daily as needed for muscle spasms. 50 tablet 1  . FLUoxetine (PROZAC) 20 MG capsule Take 60 mg by mouth daily.     Marland Kitchen gabapentin (NEURONTIN) 300 MG capsule Take 300 mg by mouth 3 (three) times daily.    Marland Kitchen  lisinopril (PRINIVIL,ZESTRIL) 5 MG tablet Take 5 mg by mouth daily.    . metoprolol tartrate (LOPRESSOR) 25 MG tablet Take 25 mg by mouth 2 (two) times daily.    Marland Kitchen. omeprazole (PRILOSEC) 20 MG capsule Take 20 mg by mouth 2 (two) times daily as needed. For reflux     No current facility-administered medications on file prior to visit.    ALLERGIES: Allergies  Allergen Reactions  . Wellbutrin [Bupropion] Other (See Comments)    Hallucinations  . Septra [Sulfamethoxazole-Trimethoprim] Nausea And Vomiting    Vomiting  . Sulfa Antibiotics Nausea And Vomiting  . Zithromax  [Azithromycin] Hives  . Cymbalta [Duloxetine Hcl]     fatigue    FAMILY HISTORY: Family History  Problem Relation Age of Onset  . Stroke Mother   . Hypertension Mother   . Hyperlipidemia Mother   . Seizures Mother     Grand mal seizures  . Diabetes Father   . Heart disease Father     Heart Disease before age 64  . Heart disease Paternal Uncle   . Heart attack Paternal Uncle   . Stroke Maternal Grandmother   . Heart Problems Maternal Grandmother   . Cancer Maternal Grandfather     stomach cancer  . Heart attack Maternal Grandfather   . Heart disease Paternal Grandfather   . Heart attack Paternal Grandfather   . Deep vein thrombosis Brother   . Asthma Brother   . Cancer Paternal Grandmother [id number]    SOCIAL HISTORY: Social History   Social History  . Marital Status: Legally Separated    Spouse Name: N/A  . Number of Children: N/A  . Years of Education: N/A   Occupational History  . Not on file.   Social History Main Topics  . Smoking status: Current Some Day Smoker -- 0.20 packs/day for 45 years    Types: Cigarettes  . Smokeless tobacco: Never Used     Comment: pt states she only smokes about 2-3 cigs per day  . Alcohol Use: 0.0 oz/week    0 Standard drinks or equivalent per week     Comment: 2 times a year  . Drug Use: No  . Sexual Activity: Not on file   Other Topics Concern  . Not on file   Social History Narrative   Epworth Sleepiness Scale = 10 (as of 05/21/2015)    REVIEW OF SYSTEMS: Constitutional: No fevers, chills, or sweats, no generalized fatigue, change in appetite Eyes: No visual changes, double vision, eye pain Ear, nose and throat: No hearing loss, ear pain, nasal congestion, sore throat Cardiovascular: No chest pain, palpitations Respiratory:  No shortness of breath at rest or with exertion, wheezes GastrointestinaI: No nausea, vomiting, diarrhea, abdominal pain, fecal incontinence Genitourinary:  No dysuria, urinary retention or  frequency Musculoskeletal:  No neck pain, back pain Integumentary: No rash, pruritus, skin lesions Neurological: as above Psychiatric: No depression, insomnia, anxiety Endocrine: No palpitations, fatigue, diaphoresis, mood swings, change in appetite, change in weight, increased thirst Hematologic/Lymphatic:  No anemia, purpura, petechiae. Allergic/Immunologic: no itchy/runny eyes, nasal congestion, recent allergic reactions, rashes  PHYSICAL EXAM: Filed Vitals:   11/07/15 1432  BP: 142/76  Pulse: 78   General: No acute distress.  Head:  Normocephalic/atraumatic Eyes:  Fundoscopic exam unremarkable without vessel changes, exudates, hemorrhages or papilledema. Neck: supple, no paraspinal tenderness, full range of motion Heart:  Regular rate and rhythm Lungs:  Clear to auscultation bilaterally Back: No paraspinal tenderness Neurological Exam: alert and oriented to person,  place, and time. Attention span and concentration intact, recent and remote memory intact, fund of knowledge intact.  Speech fluent and not dysarthric, language intact.  CN II-XII intact. Fundoscopic exam unremarkable without vessel changes, exudates, hemorrhages or papilledema.  Bulk and tone normal, muscle strength 5/5 throughout.  Sensation to light touch, temperature and vibration intact.  Deep tendon reflexes 2+ throughout, toes downgoing.  Finger to nose and heel to shin testing intact.  Gait normal, Romberg negative.  IMPRESSION: Transient ischemic attack Tobacco abuse HTN Hyperlipidemia Carotid artery disease   PLAN: 1.  Plavix for secondary stroke prevention.  For polyp removal, if Plavix needs to be discontinued, would discontinue it 5 days prior to procedure and restart day after procedure.  Of course, I explained, that discontinuing Plavix will put her at an increased risk for stroke. 2.  Continue Lipitor  daily.  Recheck fasting lipid panel (LDL goal should be less than 70) 3.  Blood pressure control.   Follow up with PCP regarding monitoring 4.  Smoking cessation 5.  Mediterranean diet 6.  Follow up with vascular surgery regarding carotid artery disease  24 minutes spent face to face with patient, over 50% spent discussing management.  Shon Millet, DO  CC:  Merri Brunette, MD

## 2015-11-14 ENCOUNTER — Other Ambulatory Visit: Payer: Self-pay | Admitting: Gastroenterology

## 2015-11-19 ENCOUNTER — Other Ambulatory Visit: Payer: Self-pay | Admitting: Gastroenterology

## 2015-11-20 ENCOUNTER — Encounter (HOSPITAL_COMMUNITY)
Admission: RE | Disposition: A | Discharge status: Home / Self Care | Payer: Self-pay | Source: Ambulatory Visit | Attending: Gastroenterology

## 2015-11-20 ENCOUNTER — Encounter (HOSPITAL_COMMUNITY): Payer: Self-pay | Admitting: *Deleted

## 2015-11-20 ENCOUNTER — Ambulatory Visit (HOSPITAL_COMMUNITY)
Admission: RE | Admit: 2015-11-20 | Discharge: 2015-11-20 | Disposition: A | Discharge status: Home / Self Care | Payer: Medicare Other | Source: Ambulatory Visit | Attending: Gastroenterology | Admitting: Gastroenterology

## 2015-11-20 ENCOUNTER — Ambulatory Visit (HOSPITAL_COMMUNITY): Payer: Medicare Other | Admitting: Certified Registered Nurse Anesthetist

## 2015-11-20 DIAGNOSIS — F17210 Nicotine dependence, cigarettes, uncomplicated: Secondary | ICD-10-CM | POA: Diagnosis not present

## 2015-11-20 DIAGNOSIS — K838 Other specified diseases of biliary tract: Secondary | ICD-10-CM | POA: Diagnosis not present

## 2015-11-20 DIAGNOSIS — I739 Peripheral vascular disease, unspecified: Secondary | ICD-10-CM | POA: Insufficient documentation

## 2015-11-20 DIAGNOSIS — J309 Allergic rhinitis, unspecified: Secondary | ICD-10-CM | POA: Insufficient documentation

## 2015-11-20 DIAGNOSIS — R1011 Right upper quadrant pain: Secondary | ICD-10-CM | POA: Insufficient documentation

## 2015-11-20 DIAGNOSIS — K219 Gastro-esophageal reflux disease without esophagitis: Secondary | ICD-10-CM | POA: Diagnosis not present

## 2015-11-20 DIAGNOSIS — I252 Old myocardial infarction: Secondary | ICD-10-CM | POA: Diagnosis not present

## 2015-11-20 DIAGNOSIS — Z6839 Body mass index (BMI) 39.0-39.9, adult: Secondary | ICD-10-CM | POA: Insufficient documentation

## 2015-11-20 DIAGNOSIS — I10 Essential (primary) hypertension: Secondary | ICD-10-CM | POA: Diagnosis not present

## 2015-11-20 DIAGNOSIS — Z7982 Long term (current) use of aspirin: Secondary | ICD-10-CM | POA: Insufficient documentation

## 2015-11-20 DIAGNOSIS — R932 Abnormal findings on diagnostic imaging of liver and biliary tract: Secondary | ICD-10-CM | POA: Diagnosis not present

## 2015-11-20 DIAGNOSIS — E7800 Pure hypercholesterolemia, unspecified: Secondary | ICD-10-CM | POA: Insufficient documentation

## 2015-11-20 DIAGNOSIS — Z79899 Other long term (current) drug therapy: Secondary | ICD-10-CM | POA: Diagnosis not present

## 2015-11-20 DIAGNOSIS — J449 Chronic obstructive pulmonary disease, unspecified: Secondary | ICD-10-CM | POA: Insufficient documentation

## 2015-11-20 DIAGNOSIS — R1013 Epigastric pain: Secondary | ICD-10-CM | POA: Diagnosis not present

## 2015-11-20 DIAGNOSIS — Z8673 Personal history of transient ischemic attack (TIA), and cerebral infarction without residual deficits: Secondary | ICD-10-CM | POA: Insufficient documentation

## 2015-11-20 HISTORY — PX: ERCP: SHX5425

## 2015-11-20 HISTORY — PX: EUS: SHX5427

## 2015-11-20 HISTORY — DX: Family history of other specified conditions: Z84.89

## 2015-11-20 SURGERY — UPPER ENDOSCOPIC ULTRASOUND (EUS) RADIAL
Anesthesia: Monitor Anesthesia Care

## 2015-11-20 MED ORDER — GLUCAGON HCL RDNA (DIAGNOSTIC) 1 MG IJ SOLR
INTRAMUSCULAR | Status: AC
  Filled 2015-11-20: qty 1

## 2015-11-20 MED ORDER — PROPOFOL 10 MG/ML IV BOLUS
INTRAVENOUS | Status: AC
  Filled 2015-11-20: qty 40

## 2015-11-20 MED ORDER — LACTATED RINGERS IV SOLN
INTRAVENOUS | Status: DC
  Administered 2015-11-20: 1000 mL via INTRAVENOUS

## 2015-11-20 MED ORDER — SODIUM CHLORIDE 0.9 % IV SOLN: INTRAVENOUS | Status: DC

## 2015-11-20 MED ORDER — PROPOFOL 10 MG/ML IV BOLUS
INTRAVENOUS | Status: AC
  Filled 2015-11-20: qty 20

## 2015-11-20 MED ORDER — LIDOCAINE HCL (CARDIAC) 20 MG/ML IV SOLN
INTRAVENOUS | Status: AC
  Filled 2015-11-20: qty 5

## 2015-11-20 MED ORDER — ONDANSETRON HCL 4 MG/2ML IJ SOLN
INTRAMUSCULAR | Status: AC
  Filled 2015-11-20: qty 2

## 2015-11-20 MED ORDER — PROPOFOL 500 MG/50ML IV EMUL
INTRAVENOUS | Status: DC | PRN
  Administered 2015-11-20: 250 ug/kg/min via INTRAVENOUS

## 2015-11-20 MED ORDER — ONDANSETRON HCL 4 MG/2ML IJ SOLN
INTRAMUSCULAR | Status: DC | PRN
  Administered 2015-11-20: 4 mg via INTRAVENOUS

## 2015-11-20 MED ORDER — FENTANYL CITRATE (PF) 100 MCG/2ML IJ SOLN
INTRAMUSCULAR | Status: AC
  Filled 2015-11-20: qty 2

## 2015-11-20 MED ORDER — CIPROFLOXACIN IN D5W 400 MG/200ML IV SOLN
INTRAVENOUS | Status: AC
  Filled 2015-11-20: qty 200

## 2015-11-20 NOTE — Op Note (Signed)
[facility] Patient Name: Norma Parsonsorma Toya Procedure Date: 11/20/2015 MRN: [id number] Attending MD: Willis ModenaWilliam Tinisha Etzkorn , MD Date of Birth: 09/29/1950 CSN:  Age: [age] Admit Type: Outpatient Procedure:                Upper EUS Indications:              Common bile duct dilation (acquired) seen on MRCP,                            Epigastric abdominal pain, Abdominal pain in the                            right upper quadrant, LFTs normal. Providers:                Willis ModenaWilliam Analei Whinery, MD, Dow AdolphKaren Hinson, RN, Clearnce SorrelKatie Smith,                            Technician, Oletha Blendavida Shoffner, Technician Referring MD:              Medicines:                Propofol per Anesthesia Complications:            No immediate complications. Estimated Blood Loss:     Estimated blood loss: none. Procedure:                Pre-Anesthesia Assessment:                           - Prior to the procedure, a History and Physical                            was performed, and patient medications and                            allergies were reviewed. The patient's tolerance of                            previous anesthesia was also reviewed. The risks                            and benefits of the procedure and the sedation                            options and risks were discussed with the patient.                            All questions were answered, and informed consent                            was obtained. Prior Anticoagulants: The patient has                            taken no previous anticoagulant or antiplatelet  agents. ASA Grade Assessment: III - A patient with                            severe systemic disease. After reviewing the risks                            and benefits, the patient was deemed in                            satisfactory condition to undergo the procedure.                           After obtaining informed consent, the endoscope was                             passed under direct vision. Throughout the                            procedure, the patient's blood pressure, pulse, and                            oxygen saturations were monitored continuously. The                            [id number] ([id number]) scope was introduced through                            the mouth, and advanced to the second part of                            duodenum. The upper EUS was accomplished without                            difficulty. The patient tolerated the procedure                            well. Scope In: Scope Out: Findings:      Endoscopic Finding :      A medium amount of food (residue) was found in the gastric fundus, in       the gastric body and in the gastric antrum.      Endosonographic Finding :      There was no sign of significant endosonographic abnormality in the       ampulla.      There was dilation in the common bile duct which measured up to 13 mm,       and tapered to about 7mm. No bile duct wall thickening, mass or       choledocholithiasis was identified.      There was no sign of significant endosonographic abnormality in the       pancreatic head, in the genu of the pancreas, in the pancreatic body, in       the pancreatic tail and in the uncinate process of the pancreas. No       pancreatic mass. No features of chronci pancreatitis were noted.  There was no sign of significant endosonographic abnormality in the left       lobe of the liver.      The region of the celiac plexus and celiac ganglia was visualized and       showed no sign of significant endosonographic abnormality. Impression:               - A medium amount of food (residue) in the stomach.                           - There was no sign of significant pathology in the                            ampulla.                           - There was dilation in the common bile duct which                            measured up to 13 mm.                           -  There was no sign of significant pathology in the                            pancreatic head, in the genu of the pancreas, in                            the pancreatic body, in the pancreatic tail and in                            the uncinate process of the pancreas.                           - There was no evidence of significant pathology in                            the left lobe of the liver.                           - No explanation for patient's ongoing abdominal                            pain was identified on today's examination. Moderate Sedation:      N/A- Per Anesthesia Care Recommendation:           - Discharge patient to home (via wheelchair).                           - Resume previous diet today.                           - Return to GI clinic, Dr. Evette Cristal, at appointment to  be scheduled.                           - Return to referring physician as previously                            scheduled. Procedure Code(s):        --- Professional ---                           [phone], Esophagogastroduodenoscopy, flexible,                            transoral; with endoscopic ultrasound examination,                            including the esophagus, stomach, and either the                            duodenum or a surgically altered stomach where the                            jejunum is examined distal to the anastomosis Diagnosis Code(s):        --- Professional ---                           K83.8, Other specified diseases of biliary tract                           R10.13, Epigastric pain                           R10.11, Right upper quadrant pain CPT copyright 2016 [employer]. All rights reserved. The codes documented in this report are preliminary and upon coder review may  be revised to meet current compliance requirements. Willis Modena, MD 11/20/2015 10:20:16 AM This report has been signed electronically. Number of Addenda: 0

## 2015-11-20 NOTE — Anesthesia Preprocedure Evaluation (Signed)
Anesthesia Evaluation  Patient identified by MRN, date of birth, ID band Patient awake    Reviewed: Allergy & Precautions, H&P , NPO status , Patient's Chart, lab work & pertinent test results  History of Anesthesia Complications Negative for: history of anesthetic complications  Airway Mallampati: II       Dental  (+) Teeth Intact, Dental Advisory Given   Pulmonary neg shortness of breath, asthma , neg sleep apnea, COPD, neg recent URI, Current Smoker,    breath sounds clear to auscultation       Cardiovascular hypertension, Pt. on medications (-) angina+ CAD, + Past MI and + Peripheral Vascular Disease  (-) CHF  Rhythm:Regular Rate:Normal     Neuro/Psych TIA Neuromuscular disease    GI/Hepatic Neg liver ROS, GERD  Medicated and Controlled,  Endo/Other  Morbid obesity  Renal/GU negative Renal ROS     Musculoskeletal  (+) Fibromyalgia -  Abdominal   Peds  Hematology   Anesthesia Other Findings   Reproductive/Obstetrics                             Anesthesia Physical Anesthesia Plan  ASA: III  Anesthesia Plan: MAC and General   Post-op Pain Management:    Induction: Intravenous  Airway Management Planned: Natural Airway, Nasal Cannula, Simple Face Mask and Oral ETT  Additional Equipment: None  Intra-op Plan:   Post-operative Plan: Extubation in OR  Informed Consent: I have reviewed the patients History and Physical, chart, labs and discussed the procedure including the risks, benefits and alternatives for the proposed anesthesia with the patient or authorized representative who has indicated his/her understanding and acceptance.   Dental advisory given  Plan Discussed with: CRNA and Surgeon  Anesthesia Plan Comments:         Anesthesia Quick Evaluation

## 2015-11-20 NOTE — Discharge Instructions (Signed)
Upper Endoscopic Ultrasound ° °Care After °Please read the instructions outlined below and refer to this sheet in the next few weeks. These discharge instructions provide you with general information on caring for yourself after you leave the hospital. Your doctor may also give you specific instructions. While your treatment has been planned according to the most current medical practices available, unavoidable complications occasionally occur. If you have any problems or questions after discharge, please call Dr. Holden Draughon ([facility]) at [phone]. ° °HOME CARE INSTRUCTIONS °Activity °· You may resume your regular activity but move at a slower pace for the next 24 hours.  °· Take frequent rest periods for the next 24 hours.  °· Walking will help expel (get rid of) the air and reduce the bloated feeling in your abdomen.  °· No driving for 24 hours (because of the anesthesia (medicine) used during the test).  °· You may shower.  °· Do not sign any important legal documents or operate any machinery for 24 hours (because of the anesthesia used during the test).  °Nutrition °· Drink plenty of fluids.  °· You may resume your normal diet.  °· Begin with a light meal and progress to your normal diet.  °· Avoid alcoholic beverages for 24 hours or as instructed by your caregiver.  °Medications °You may resume your normal medications unless your caregiver tells you otherwise. °What you can expect today °· You may experience abdominal discomfort such as a feeling of fullness or "gas" pains.  °· You may experience a sore throat for 2 to 3 days. This is normal. Gargling with salt water may help this.  °·  °SEEK IMMEDIATE MEDICAL CARE IF: °· You have excessive nausea (feeling sick to your stomach) and/or vomiting.  °· You have severe abdominal pain and distention (swelling).  °· You have trouble swallowing.  °· You have a temperature over 100° F (37.8° C).  °· You have rectal bleeding or vomiting of blood.  °Document  Released: 02/25/2004 Document Revised: 03/25/2011 Document Reviewed: 09/07/2007 °ExitCare® Patient Information ©2012 [employer]. °

## 2015-11-20 NOTE — H&P (Signed)
Patient interval history reviewed.  Patient examined again.  There has been no change from documented H/P dated 10/31/15 (scanned into chart from our office) except as documented above.  Assessment:  1.  Dilated bile duct.  Normal LFTs.  Unrevealing MRCP.  Plan:  1.  Endoscopic ultrasound with possible ERCP. 2.  Risks (bleeding, infection, bowel perforation that could require surgery, sedation-related changes in cardiopulmonary systems), benefits (identification and possible treatment of source of symptoms, exclusion of certain causes of symptoms), and alternatives (watchful waiting, radiographic imaging studies, empiric medical treatment) of upper endoscopy with ultrasound (EUS) were explained to patient/family in detail and patient wishes to proceed. 3.  Risks (up to and including bleeding, infection, perforation, pancreatitis that can be complicated by infected necrosis and death), benefits (removal of stones, alleviating blockage, decreasing risk of cholangitis or choledocholithiasis-related pancreatitis), and alternatives (watchful waiting, percutaneous transhepatic cholangiography) of ERCP were explained to patient/family in detail and patient elects to proceed.

## 2015-11-20 NOTE — Transfer of Care (Signed)
Immediate Anesthesia Transfer of Care Note  Patient: Norma Scott  Procedure(s) Performed: Procedure(s): UPPER ENDOSCOPIC ULTRASOUND (EUS) RADIAL (N/A) ENDOSCOPIC RETROGRADE CHOLANGIOPANCREATOGRAPHY (ERCP) (N/A)  Patient Location: PACU  Anesthesia Type:MAC  Level of Consciousness:  sedated, patient cooperative and responds to stimulation  Airway & Oxygen Therapy:Patient Spontanous Breathing and Patient connected to face mask oxgen  Post-op Assessment:  Report given to PACU RN and Post -op Vital signs reviewed and stable  Post vital signs:  Reviewed and stable  Last Vitals:  Filed Vitals:   11/20/15 0912 11/20/15 0924  BP:  176/69  Pulse:  64  Temp: 36.7 C 36.7 C  Resp:  22    Complications: No apparent anesthesia complications

## 2015-11-21 ENCOUNTER — Encounter (HOSPITAL_COMMUNITY): Payer: Self-pay | Admitting: Gastroenterology

## 2015-11-22 ENCOUNTER — Encounter: Payer: Self-pay | Admitting: Vascular Surgery

## 2015-11-23 NOTE — Anesthesia Postprocedure Evaluation (Signed)
Anesthesia Post Note  Patient: Norma Scott  Procedure(s) Performed: Procedure(s) (LRB): UPPER ENDOSCOPIC ULTRASOUND (EUS) RADIAL (N/A) ENDOSCOPIC RETROGRADE CHOLANGIOPANCREATOGRAPHY (ERCP) (N/A)  Patient location during evaluation: Endoscopy Anesthesia Type: MAC Level of consciousness: awake Pain management: pain level controlled Vital Signs Assessment: post-procedure vital signs reviewed and stable Respiratory status: spontaneous breathing Cardiovascular status: stable Postop Assessment: no signs of nausea or vomiting Anesthetic complications: no    Last Vitals:  Filed Vitals:   11/20/15 1040 11/20/15 1050  BP: 154/72 152/71  Pulse: 58 58  Temp:    Resp: 19 13    Last Pain: There were no vitals filed for this visit.               Kalee Broxton

## 2015-11-26 ENCOUNTER — Other Ambulatory Visit: Payer: Self-pay | Admitting: *Deleted

## 2015-11-26 DIAGNOSIS — I6523 Occlusion and stenosis of bilateral carotid arteries: Secondary | ICD-10-CM

## 2015-11-26 DIAGNOSIS — I739 Peripheral vascular disease, unspecified: Secondary | ICD-10-CM

## 2015-11-27 ENCOUNTER — Encounter: Payer: Self-pay | Admitting: Family

## 2015-11-27 ENCOUNTER — Ambulatory Visit (INDEPENDENT_AMBULATORY_CARE_PROVIDER_SITE_OTHER)
Admission: RE | Admit: 2015-11-27 | Discharge: 2015-11-27 | Disposition: A | Discharge status: Home / Self Care | Payer: Medicare Other | Source: Ambulatory Visit | Attending: Family | Admitting: Family

## 2015-11-27 ENCOUNTER — Ambulatory Visit (HOSPITAL_COMMUNITY)
Admission: RE | Admit: 2015-11-27 | Discharge: 2015-11-27 | Disposition: A | Discharge status: Home / Self Care | Payer: Medicare Other | Source: Ambulatory Visit | Attending: Family | Admitting: Family

## 2015-11-27 ENCOUNTER — Ambulatory Visit (INDEPENDENT_AMBULATORY_CARE_PROVIDER_SITE_OTHER): Payer: Medicare Other | Admitting: Family

## 2015-11-27 VITALS — BP 158/77 | HR 60 | Ht 60.0 in | Wt 202.1 lb

## 2015-11-27 DIAGNOSIS — K219 Gastro-esophageal reflux disease without esophagitis: Secondary | ICD-10-CM | POA: Diagnosis not present

## 2015-11-27 DIAGNOSIS — I6523 Occlusion and stenosis of bilateral carotid arteries: Secondary | ICD-10-CM | POA: Diagnosis not present

## 2015-11-27 DIAGNOSIS — Z48812 Encounter for surgical aftercare following surgery on the circulatory system: Secondary | ICD-10-CM | POA: Diagnosis not present

## 2015-11-27 DIAGNOSIS — I739 Peripheral vascular disease, unspecified: Secondary | ICD-10-CM | POA: Diagnosis not present

## 2015-11-27 DIAGNOSIS — I10 Essential (primary) hypertension: Secondary | ICD-10-CM | POA: Insufficient documentation

## 2015-11-27 DIAGNOSIS — E785 Hyperlipidemia, unspecified: Secondary | ICD-10-CM | POA: Insufficient documentation

## 2015-11-27 DIAGNOSIS — I779 Disorder of arteries and arterioles, unspecified: Secondary | ICD-10-CM

## 2015-11-27 DIAGNOSIS — Z9889 Other specified postprocedural states: Secondary | ICD-10-CM | POA: Diagnosis not present

## 2015-11-27 DIAGNOSIS — Z720 Tobacco use: Secondary | ICD-10-CM

## 2015-11-27 DIAGNOSIS — F17200 Nicotine dependence, unspecified, uncomplicated: Secondary | ICD-10-CM

## 2015-11-27 NOTE — Patient Instructions (Signed)
Stroke Prevention Some medical conditions and behaviors are associated with an increased chance of having a stroke. You may prevent a stroke by making healthy choices and managing medical conditions. HOW CAN I REDUCE MY RISK OF HAVING A STROKE?   Stay physically active. Get at least 30 minutes of activity on most or all days.  Do not smoke. It may also be helpful to avoid exposure to secondhand smoke.  Limit alcohol use. Moderate alcohol use is considered to be:  No more than 2 drinks per day for men.  No more than 1 drink per day for nonpregnant women.  Eat healthy foods. This involves:  Eating 5 or more servings of fruits and vegetables a day.  Making dietary changes that address high blood pressure (hypertension), high cholesterol, diabetes, or obesity.  Manage your cholesterol levels.  Making food choices that are high in fiber and low in saturated fat, trans fat, and cholesterol may control cholesterol levels.  Take any prescribed medicines to control cholesterol as directed by your health care provider.  Manage your diabetes.  Controlling your carbohydrate and sugar intake is recommended to manage diabetes.  Take any prescribed medicines to control diabetes as directed by your health care provider.  Control your hypertension.  Making food choices that are low in salt (sodium), saturated fat, trans fat, and cholesterol is recommended to manage hypertension.  Ask your health care provider if you need treatment to lower your blood pressure. Take any prescribed medicines to control hypertension as directed by your health care provider.  If you are 18-39 years of age, have your blood pressure checked every 3-5 years. If you are 40 years of age or older, have your blood pressure checked every year.  Maintain a healthy weight.  Reducing calorie intake and making food choices that are low in sodium, saturated fat, trans fat, and cholesterol are recommended to manage  weight.  Stop drug abuse.  Avoid taking birth control pills.  Talk to your health care provider about the risks of taking birth control pills if you are over 35 years old, smoke, get migraines, or have ever had a blood clot.  Get evaluated for sleep disorders (sleep apnea).  Talk to your health care provider about getting a sleep evaluation if you snore a lot or have excessive sleepiness.  Take medicines only as directed by your health care provider.  For some people, aspirin or blood thinners (anticoagulants) are helpful in reducing the risk of forming abnormal blood clots that can lead to stroke. If you have the irregular heart rhythm of atrial fibrillation, you should be on a blood thinner unless there is a good reason you cannot take them.  Understand all your medicine instructions.  Make sure that other conditions (such as anemia or atherosclerosis) are addressed. SEEK IMMEDIATE MEDICAL CARE IF:   You have sudden weakness or numbness of the face, arm, or leg, especially on one side of the body.  Your face or eyelid droops to one side.  You have sudden confusion.  You have trouble speaking (aphasia) or understanding.  You have sudden trouble seeing in one or both eyes.  You have sudden trouble walking.  You have dizziness.  You have a loss of balance or coordination.  You have a sudden, severe headache with no known cause.  You have new chest pain or an irregular heartbeat. Any of these symptoms may represent a serious problem that is an emergency. Do not wait to see if the symptoms will   go away. Get medical help at once. Call your local emergency services (911 in [place] Do not drive yourself to the hospital.   This information is not intended to replace advice given to you by your health care provider. Make sure you discuss any questions you have with your health care provider.   Document Released: 08/20/2004 Document Revised: 08/03/2014 Document Reviewed:  01/13/2013 Elsevier Interactive Patient Education 2016 [employer]    Peripheral Vascular Disease Peripheral vascular disease (PVD) is a disease of the blood vessels that are not part of your heart and brain. A simple term for PVD is poor circulation. In most cases, PVD narrows the blood vessels that carry blood from your heart to the rest of your body. This can result in a decreased supply of blood to your arms, legs, and internal organs, like your stomach or kidneys. However, it most often affects a person's lower legs and feet. There are two types of PVD.  Organic PVD. This is the more common type. It is caused by damage to the structure of blood vessels.  Functional PVD. This is caused by conditions that make blood vessels contract and tighten (spasm). Without treatment, PVD tends to get worse over time. PVD can also lead to acute ischemic limb. This is when an arm or limb suddenly has trouble getting enough blood. This is a medical emergency. CAUSES Each type of PVD has many different causes. The most common cause of PVD is buildup of a fatty material (plaque) inside of your arteries (atherosclerosis). Small amounts of plaque can break off from the walls of the blood vessels and become lodged in a smaller artery. This blocks blood flow and can cause acute ischemic limb. Other common causes of PVD include:  Blood clots that form inside of blood vessels.  Injuries to blood vessels.  Diseases that cause inflammation of blood vessels or cause blood vessel spasms.  Health behaviors and health history that increase your risk of developing PVD. RISK FACTORS  You may have a greater risk of PVD if you:  Have a family history of PVD.  Have certain medical conditions, including:  High cholesterol.  Diabetes.  High blood pressure (hypertension).  Coronary heart disease.  Past problems with blood clots.  Past injury, such as burns or a broken bone. These may have damaged blood  vessels in your limbs.  Buerger disease. This is caused by inflamed blood vessels in your hands and feet.  Some forms of arthritis.  Rare birth defects that affect the arteries in your legs.  Use tobacco.  Do not get enough exercise.  Are obese.  Are age 50 or older. SIGNS AND SYMPTOMS  PVD may cause many different symptoms. Your symptoms depend on what part of your body is not getting enough blood. Some common signs and symptoms include:  Cramps in your lower legs. This may be a symptom of poor leg circulation (claudication).  Pain and weakness in your legs while you are physically active that goes away when you rest (intermittent claudication).  Leg pain when at rest.  Leg numbness, tingling, or weakness.  Coldness in a leg or foot, especially when compared with the other leg.  Skin or hair changes. These can include:  Hair loss.  Shiny skin.  Pale or bluish skin.  Thick toenails.  Inability to get or maintain an erection (erectile dysfunction). People with PVD are more prone to developing ulcers and sores on their toes, feet, or legs. These may take longer than   normal to heal. DIAGNOSIS Your health care provider may diagnose PVD from your signs and symptoms. The health care provider will also do a physical exam. You may have tests to find out what is causing your PVD and determine its severity. Tests may include:  Blood pressure recordings from your arms and legs and measurements of the strength of your pulses (pulse volume recordings).  Imaging studies using sound waves to take pictures of the blood flow through your blood vessels (Doppler ultrasound).  Injecting a dye into your blood vessels before having imaging studies using:  X-rays (angiogram or arteriogram).  Computer-generated X-rays (CT angiogram).  A powerful electromagnetic field and a computer (magnetic resonance angiogram or MRA). TREATMENT Treatment for PVD depends on the cause of your condition  and the severity of your symptoms. It also depends on your age. Underlying causes need to be treated and controlled. These include long-lasting (chronic) conditions, such as diabetes, high cholesterol, and high blood pressure. You may need to first try making lifestyle changes and taking medicines. Surgery may be needed if these do not work. Lifestyle changes may include:  Quitting smoking.  Exercising regularly.  Following a low-fat, low-cholesterol diet. Medicines may include:  Blood thinners to prevent blood clots.  Medicines to improve blood flow.  Medicines to improve your blood cholesterol levels. Surgical procedures may include:  A procedure that uses an inflated balloon to open a blocked artery and improve blood flow (angioplasty).  A procedure to put in a tube (stent) to keep a blocked artery open (stent implant).  Surgery to reroute blood flow around a blocked artery (peripheral bypass surgery).  Surgery to remove dead tissue from an infected wound on the affected limb.  Amputation. This is surgical removal of the affected limb. This may be necessary in cases of acute ischemic limb that are not improved through medical or surgical treatments. HOME CARE INSTRUCTIONS  Take medicines only as directed by your health care provider.  Do not use any tobacco products, including cigarettes, chewing tobacco, or electronic cigarettes. If you need help quitting, ask your health care provider.  Lose weight if you are overweight, and maintain a healthy weight as directed by your health care provider.  Eat a diet that is low in fat and cholesterol. If you need help, ask your health care provider.  Exercise regularly. Ask your health care provider to suggest some good activities for you.  Use compression stockings or other mechanical devices as directed by your health care provider.  Take good care of your feet.  Wear comfortable shoes that fit well.  Check your feet often for  any cuts or sores. SEEK MEDICAL CARE IF:  You have cramps in your legs while walking.  You have leg pain when you are at rest.  You have coldness in a leg or foot.  Your skin changes.  You have erectile dysfunction.  You have cuts or sores on your feet that are not healing. SEEK IMMEDIATE MEDICAL CARE IF:  Your arm or leg turns cold and blue.  Your arms or legs become red, warm, swollen, painful, or numb.  You have chest pain or trouble breathing.  You suddenly have weakness in your face, arm, or leg.  You become very confused or lose the ability to speak.  You suddenly have a very bad headache or lose your vision.   This information is not intended to replace advice given to you by your health care provider. Make sure you discuss any questions   you have with your health care provider.   Document Released: 08/20/2004 Document Revised: 08/03/2014 Document Reviewed: 12/21/2013 Elsevier Interactive Patient Education 2016 [employer] Inc[employer]     Steps to Quit Smoking  Smoking tobacco can be harmful to your health and can affect almost every organ in your body. Smoking puts you, and those around you, at risk for developing many serious chronic diseases. Quitting smoking is difficult, but it is one of the best things that you can do for your health. It is never too late to quit. WHAT ARE THE BENEFITS OF QUITTING SMOKING? When you quit smoking, you lower your risk of developing serious diseases and conditions, such as:  Lung cancer or lung disease, such as COPD.  Heart disease.  Stroke.  Heart attack.  Infertility.  Osteoporosis and bone fractures. Additionally, symptoms such as coughing, wheezing, and shortness of breath may get better when you quit. You may also find that you get sick less often because your body is stronger at fighting off colds and infections. If you are pregnant, quitting smoking can help to reduce your chances of having a baby of low birth weight. HOW DO  I GET READY TO QUIT? When you decide to quit smoking, create a plan to make sure that you are successful. Before you quit:  Pick a date to quit. Set a date within the next two weeks to give you time to prepare.  Write down the reasons why you are quitting. Keep this list in places where you will see it often, such as on your bathroom mirror or in your car or wallet.  Identify the people, places, things, and activities that make you want to smoke (triggers) and avoid them. Make sure to take these actions:  Throw away all cigarettes at home, at work, and in your car.  Throw away smoking accessories, such as ashtrays and lighters.  Clean your car and make sure to empty the ashtray.  Clean your home, including curtains and carpets.  Tell your family, friends, and coworkers that you are quitting. Support from your loved ones can make quitting easier.  Talk with your health care provider about your options for quitting smoking.  Find out what treatment options are covered by your health insurance. WHAT STRATEGIES CAN I USE TO QUIT SMOKING?  Talk with your healthcare provider about different strategies to quit smoking. Some strategies include:  Quitting smoking altogether instead of gradually lessening how much you smoke over a period of time. Research shows that quitting "cold turkey" is more successful than gradually quitting.  Attending in-person counseling to help you build problem-solving skills. You are more likely to have success in quitting if you attend several counseling sessions. Even short sessions of 10 minutes can be effective.  Finding resources and support systems that can help you to quit smoking and remain smoke-free after you quit. These resources are most helpful when you use them often. They can include:  Online chats with a counselor.  Telephone quitlines.  Printed self-help materials.  Support groups or group counseling.  Text messaging programs.  Mobile phone  applications.  Taking medicines to help you quit smoking. (If you are pregnant or breastfeeding, talk with your health care provider first.) Some medicines contain nicotine and some do not. Both types of medicines help with cravings, but the medicines that include nicotine help to relieve withdrawal symptoms. Your health care provider may recommend:  Nicotine patches, gum, or lozenges.  Nicotine inhalers or sprays.  Non-nicotine medicine that   is taken by mouth. Talk with your health care provider about combining strategies, such as taking medicines while you are also receiving in-person counseling. Using these two strategies together makes you more likely to succeed in quitting than if you used either strategy on its own. If you are pregnant or breastfeeding, talk with your health care provider about finding counseling or other support strategies to quit smoking. Do not take medicine to help you quit smoking unless told to do so by your health care provider. WHAT THINGS CAN I DO TO MAKE IT EASIER TO QUIT? Quitting smoking might feel overwhelming at first, but there is a lot that you can do to make it easier. Take these important actions:  Reach out to your family and friends and ask that they support and encourage you during this time. Call telephone quitlines, reach out to support groups, or work with a counselor for support.  Ask people who smoke to avoid smoking around you.  Avoid places that trigger you to smoke, such as bars, parties, or smoke-break areas at work.  Spend time around people who do not smoke.  Lessen stress in your life, because stress can be a smoking trigger for some people. To lessen stress, try:  Exercising regularly.  Deep-breathing exercises.  Yoga.  Meditating.  Performing a body scan. This involves closing your eyes, scanning your body from head to toe, and noticing which parts of your body are particularly tense. Purposefully relax the muscles in those  areas.  Download or purchase mobile phone or tablet apps (applications) that can help you stick to your quit plan by providing reminders, tips, and encouragement. There are many free apps, such as QuitGuide from the CDC (Centers for Disease Control and Prevention). You can find other support for quitting smoking (smoking cessation) through smokefree.gov and other websites. HOW WILL I FEEL WHEN I QUIT SMOKING? Within the first 24 hours of quitting smoking, you may start to feel some withdrawal symptoms. These symptoms are usually most noticeable 2-3 days after quitting, but they usually do not last beyond 2-3 weeks. Changes or symptoms that you might experience include:  Mood swings.  Restlessness, anxiety, or irritation.  Difficulty concentrating.  Dizziness.  Strong cravings for sugary foods in addition to nicotine.  Mild weight gain.  Constipation.  Nausea.  Coughing or a sore throat.  Changes in how your medicines work in your body.  A depressed mood.  Difficulty sleeping (insomnia). After the first 2-3 weeks of quitting, you may start to notice more positive results, such as:  Improved sense of smell and taste.  Decreased coughing and sore throat.  Slower heart rate.  Lower blood pressure.  Clearer skin.  The ability to breathe more easily.  Fewer sick days. Quitting smoking is very challenging for most people. Do not get discouraged if you are not successful the first time. Some people need to make many attempts to quit before they achieve long-term success. Do your best to stick to your quit plan, and talk with your health care provider if you have any questions or concerns.   This information is not intended to replace advice given to you by your health care provider. Make sure you discuss any questions you have with your health care provider.   Document Released: 07/07/2001 Document Revised: 11/27/2014 Document Reviewed: 11/27/2014 Elsevier Interactive Patient  Education 2016 [employer].  

## 2015-11-27 NOTE — Progress Notes (Signed)
[facility] Lovie Macadamia   MRN : [id number]   CC: Follow up extracranial carotid artery stenosis and PAOD  History of Present Illness:   Norma Scott is a 65 y.o. female patient of Dr. Myra Gianotti who returns for followup of 2 problems: Carotid stenosis, and lower extremity vascular disease. She is status post right carotid endarterectomy in July of 2010 for asymptomatic stenosis.  She had lumbar spine surgery Nov., 2013, states her right foot is often weak, but her legs feel stronger since the back surgery and she rarely falls compared to before her back surgery when she had frequent falls.  She still has throbbing in left thigh and calf after walking 1/4 of a block or less, relieved by rest, no claudication in right leg with walking, states she has increased her walking. She does yard work daily.  Patient denies non healing ulcers on lower extremities.  Pt states she has had 3 TIA's in the last year as manifested by tingling on both sides of lips, blurred vision in both eyes, and slurred speech, hemiparesis in the left arm; sx's lasted less than an hour.  Last TIA was the Fall of 2016. Was evaluated by a neurologist in [place], Dr. Everlena Cooper, since her Barbaraann Share; she last saw him in April 2017. She is ambidextrous.    Pt Diabetic: No  Pt smoker: smoker (1/2 ppd/day x 40+ yrs)  Pt meds include: Statin :Yes ASA: Yes Other anticoagulants/antiplatelets: Plavix started since on of her TIA's in 2016     Current Outpatient Prescriptions  Medication Sig Dispense Refill  . aspirin 81 MG tablet Take 81 mg by mouth daily.    Marland Kitchen atorvastatin (LIPITOR) 40 MG tablet Take 80 mg by mouth daily.    . clonazePAM (KLONOPIN) 0.5 MG tablet Take 0.5 mg by mouth 2 (two) times daily.     . clopidogrel (PLAVIX) 75 MG tablet Take 1 tablet (75 mg total) by mouth daily. 30 tablet 4  . cyclobenzaprine (FLEXERIL) 10 MG tablet Take 0.5-1 tablets (5-10 mg total) by mouth 3 (three) times daily  as needed for muscle spasms. 50 tablet 1  . FLUoxetine (PROZAC) 20 MG capsule Take 60 mg by mouth daily.     Marland Kitchen gabapentin (NEURONTIN) 300 MG capsule Take 300 mg by mouth 3 (three) times daily.    Marland Kitchen lisinopril (PRINIVIL,ZESTRIL) 5 MG tablet Take 5 mg by mouth daily.    . metoprolol tartrate (LOPRESSOR) 25 MG tablet Take 25 mg by mouth 2 (two) times daily.    Marland Kitchen omeprazole (PRILOSEC) 20 MG capsule Take 20 mg by mouth 2 (two) times daily as needed. For reflux     No current facility-administered medications for this visit.    Past Medical History  Diagnosis Date  . Carotid artery occlusion   . Fibromyalgia   . Esophageal reflux   . Asthma   . Hyperlipidemia   . Gall stones   . Cervical disc displacement     x2  . Lumbar disc herniation     L5-S1  . Allergy     allergic rhinitis  . COPD (chronic obstructive pulmonary disease) (HCC)   . Generalized anxiety disorder   . CAD (coronary artery disease)     patient says no problems   dr Williemae Natter smith pcp  . TIA (transient ischemic attack) 05/21/2015  . Essential hypertension 05/21/2015  . Arteriosclerotic coronary artery disease 05/21/2015  . Family history of adverse reaction to anesthesia     mother  hard to wake and affected b/p  . Mini stroke [facility])     Social History Social History  Substance Use Topics  . Smoking status: Current Some Day Smoker -- 0.50 packs/day for 45 years    Types: Cigarettes  . Smokeless tobacco: Never Used     Comment: states she only smokes about 1/2 pack  . Alcohol Use: 0.0 oz/week    0 Standard drinks or equivalent per week     Comment: 2 times a year    Family History Family History  Problem Relation Age of Onset  . Stroke Mother   . Hypertension Mother   . Hyperlipidemia Mother   . Seizures Mother     Grand mal seizures  . Diabetes Father   . Heart disease Father     Heart Disease before age 54  . Heart disease Paternal Uncle   . Heart attack Paternal Uncle   . Stroke Maternal  Grandmother   . Heart Problems Maternal Grandmother   . Cancer Maternal Grandfather     stomach cancer  . Heart attack Maternal Grandfather   . Heart disease Paternal Grandfather   . Heart attack Paternal Grandfather   . Deep vein thrombosis Brother   . Asthma Brother   . Cancer Paternal Grandmother 23    Surgical History Past Surgical History  Procedure Laterality Date  . Carotid endarterectomy  02/01/2009    Right  . Tubal ligation    . Cervical fusion      1995, 1996  . Ectopic pregnancy surgery  89  . Back surgery  06/01/2012    lumbar  . Cholecystectomy    . Eus N/A 11/20/2015    Procedure: UPPER ENDOSCOPIC ULTRASOUND (EUS) RADIAL;  Surgeon: Willis Modena, MD;  Location: [facility];  Service: Endoscopy;  Laterality: N/A;  . Ercp N/A 11/20/2015    Procedure: ENDOSCOPIC RETROGRADE CHOLANGIOPANCREATOGRAPHY (ERCP);  Surgeon: Willis Modena, MD;  Location: [facility];  Service: Endoscopy;  Laterality: N/A;  . Cholecystectomy      Allergies  Allergen Reactions  . Wellbutrin [Bupropion] Other (See Comments)    Hallucinations  . Septra [Sulfamethoxazole-Trimethoprim] Nausea And Vomiting    Vomiting  . Sulfa Antibiotics Nausea And Vomiting  . Zithromax [Azithromycin] Hives  . Cymbalta [Duloxetine Hcl] Other (See Comments)    fatigue    Current Outpatient Prescriptions  Medication Sig Dispense Refill  . aspirin 81 MG tablet Take 81 mg by mouth daily.    Marland Kitchen atorvastatin (LIPITOR) 40 MG tablet Take 80 mg by mouth daily.    . clonazePAM (KLONOPIN) 0.5 MG tablet Take 0.5 mg by mouth 2 (two) times daily.     . clopidogrel (PLAVIX) 75 MG tablet Take 1 tablet (75 mg total) by mouth daily. 30 tablet 4  . cyclobenzaprine (FLEXERIL) 10 MG tablet Take 0.5-1 tablets (5-10 mg total) by mouth 3 (three) times daily as needed for muscle spasms. 50 tablet 1  . FLUoxetine (PROZAC) 20 MG capsule Take 60 mg by mouth daily.     Marland Kitchen gabapentin (NEURONTIN) 300 MG capsule Take 300 mg by mouth 3  (three) times daily.    Marland Kitchen lisinopril (PRINIVIL,ZESTRIL) 5 MG tablet Take 5 mg by mouth daily.    . metoprolol tartrate (LOPRESSOR) 25 MG tablet Take 25 mg by mouth 2 (two) times daily.    Marland Kitchen omeprazole (PRILOSEC) 20 MG capsule Take 20 mg by mouth 2 (two) times daily as needed. For reflux     No current facility-administered medications for  this visit.     REVIEW OF SYSTEMS: See HPI for pertinent positives and negatives.  Physical Examination Filed Vitals:   11/27/15 1310 11/27/15 1312  BP: 157/78 158/77  Pulse: 60   Height: 5' (1.524 m)   Weight: 202 lb 1.6 oz (91.672 kg)   SpO2: 92%    Body mass index is 39.47 kg/(m^2).  General: WDWN obese female in NAD  Gait: Normal  HENT: WNL  Eyes: Pupils equal  Pulmonary: non-labored breathing with moist cough, rales in left posterior lung fields, scattered wheezes.  Cardiac: RRR, no murmur detected Abdomen: soft, NT, no palpated masses  Skin: no rashes, no ulcers;  no cellulitis.  VASCULAR EXAM  Carotid Bruits  Left  Right    Negative  Negative   Aorta is not palpable  Bilateral radial pulses are 2+ palpable.   Extremities with no signs of ischemia, without Gangrene; without open wounds.   LE Pulses  LEFT  RIGHT   FEMORAL  not palpable  not palpable   POPLITEAL  not palpable  not palpable   POSTERIOR TIBIAL  not palpable  not palpable   DORSALIS PEDIS  ANTERIOR TIBIAL  not palpable  not palpable    Musculoskeletal: no muscle wasting or atrophy; no edema  Neurologic: A&O X 3; Appropriate Affect ;  SENSATION: normal;  MOTOR FUNCTION: 5/5 Symmetric, CN 2-12 intact  Speech is fluent/normal         06/05/15 MRA of head/neck: IMPRESSION: 1. 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 2. Mild white matter disease likely reflects chronic microvascular ischemic change. 3. Mild atherosclerotic changes at the carotid bifurcations bilaterally without significant stenoses of  greater than 50%.   Non-Invasive Vascular Imaging (11/27/2015):  Carotid Duplex: Right ICA (CEA site) with no restenosis Left ICA with 40-59% stenosis No significant change compared to 01/22/14  ABI:  R: 1.06 ([place], 05/23/15), DP: biphasic, PT: triphasic, TBI: 0.83  L: 0.79 ([place]), DP: monophasic, PT: biphasic, TBI: 0.54     ASSESSMENT:  Norma Scott is a 65 y.o. female who is s/p right carotid endarterectomy 02/01/2009 and has moderate left ilio-femoral arterial occlusive disease. She had 3 TIA's in the last year, is seeing a neurologist. See MRA of head/neck above. She has moderate claudication in her left leg, no signs of ischemia.  Today's carotid duplex suggests right ICA (CEA site) with no restenosis Left ICA with 40-59% stenosis No significant change compared to 01/22/14  ABI's are normal in the right with bi and triphasic waveforms and moderate decrease in arterial perfusion in the left with bi and monophasic waveforms.  Her atherosclerotic risk factors include active smoking for over 40 years and obesity. She takes ASA, Plavix, a statin and a beta blocker.  Face to face time with patient was 25 minutes. Over 50% of this time was spent on counseling and coordination of care.   PLAN:   The patient was counseled re smoking cessation and given several free resources re smoking cessation.  Graduated walking program discussed. Based on today's exam and non-invasive vascular lab results, and after discussing with Dr. Edilia Bo pt's HPI, carotid duplex results today, and MRA of neck results from November 2016, the patient will follow up in 6 months with the following tests: carotid duplex and ABI's. I discussed in depth with the patient the nature of atherosclerosis, and emphasized the importance of maximal medical management including strict control of blood pressure, blood glucose, and lipid levels, obtaining regular exercise, and cessation  of smoking.  The patient is aware  that without maximal medical management the underlying atherosclerotic disease process will progress, limiting the benefit of any interventions.  The patient was given information about stroke prevention and what symptoms should prompt the patient to seek immediate medical care.  The patient was given information about PAD including signs, symptoms, treatment, what symptoms should prompt the patient to seek immediate medical care, and risk reduction measures to take. Thank you for allowing us to participate in this patient's care.  Charisse MarchSuzanne Nickel, RN, MSN, FNP-C [facility] Office: [phone]  Clinic MD: Edilia BoDickson 11/27/2015 1:44 PM

## 2016-01-01 NOTE — Addendum Note (Signed)
Addended by: Melodye PedMANESS-HARRISON, Louellen Haldeman C on: 01/01/2016 11:45 AM   Modules accepted: Orders

## 2016-02-21 ENCOUNTER — Other Ambulatory Visit: Payer: Self-pay | Admitting: Neurology

## 2016-02-24 NOTE — Telephone Encounter (Signed)
PLAN: 1.  Plavix for secondary stroke prevention.  For polyp removal, if Plavix needs to be discontinued, would discontinue it 5 days prior to procedure and restart day after procedure.  Of course, I explained, that discontinuing Plavix will put her at an increased risk for stroke.  11/07/15 11/06/16

## 2016-03-23 DIAGNOSIS — E7800 Pure hypercholesterolemia, unspecified: Secondary | ICD-10-CM | POA: Diagnosis not present

## 2016-03-23 DIAGNOSIS — F411 Generalized anxiety disorder: Secondary | ICD-10-CM | POA: Diagnosis not present

## 2016-03-23 DIAGNOSIS — I739 Peripheral vascular disease, unspecified: Secondary | ICD-10-CM | POA: Diagnosis not present

## 2016-03-23 DIAGNOSIS — M797 Fibromyalgia: Secondary | ICD-10-CM | POA: Diagnosis not present

## 2016-03-23 DIAGNOSIS — I10 Essential (primary) hypertension: Secondary | ICD-10-CM | POA: Diagnosis not present

## 2016-03-23 DIAGNOSIS — R05 Cough: Secondary | ICD-10-CM | POA: Diagnosis not present

## 2016-03-23 DIAGNOSIS — K219 Gastro-esophageal reflux disease without esophagitis: Secondary | ICD-10-CM | POA: Diagnosis not present

## 2016-06-03 ENCOUNTER — Encounter: Payer: Self-pay | Admitting: Family

## 2016-06-08 ENCOUNTER — Encounter: Payer: Self-pay | Admitting: Family

## 2016-06-08 ENCOUNTER — Ambulatory Visit (INDEPENDENT_AMBULATORY_CARE_PROVIDER_SITE_OTHER): Payer: Medicare Other | Admitting: Family

## 2016-06-08 ENCOUNTER — Other Ambulatory Visit: Payer: Self-pay | Admitting: *Deleted

## 2016-06-08 ENCOUNTER — Ambulatory Visit (HOSPITAL_COMMUNITY)
Admission: RE | Admit: 2016-06-08 | Discharge: 2016-06-08 | Disposition: A | Discharge status: Home / Self Care | Payer: Medicare Other | Source: Ambulatory Visit | Attending: Vascular Surgery | Admitting: Vascular Surgery

## 2016-06-08 ENCOUNTER — Ambulatory Visit (INDEPENDENT_AMBULATORY_CARE_PROVIDER_SITE_OTHER)
Admission: RE | Admit: 2016-06-08 | Discharge: 2016-06-08 | Disposition: A | Discharge status: Home / Self Care | Payer: Medicare Other | Source: Ambulatory Visit | Attending: Vascular Surgery | Admitting: Vascular Surgery

## 2016-06-08 VITALS — BP 157/86 | HR 60 | Temp 97.5°F | Resp 18 | Ht 60.0 in | Wt 198.4 lb

## 2016-06-08 DIAGNOSIS — I6523 Occlusion and stenosis of bilateral carotid arteries: Secondary | ICD-10-CM

## 2016-06-08 DIAGNOSIS — I779 Disorder of arteries and arterioles, unspecified: Secondary | ICD-10-CM

## 2016-06-08 DIAGNOSIS — I739 Peripheral vascular disease, unspecified: Secondary | ICD-10-CM

## 2016-06-08 DIAGNOSIS — F17200 Nicotine dependence, unspecified, uncomplicated: Secondary | ICD-10-CM

## 2016-06-08 DIAGNOSIS — Z9889 Other specified postprocedural states: Secondary | ICD-10-CM

## 2016-06-08 DIAGNOSIS — I6529 Occlusion and stenosis of unspecified carotid artery: Secondary | ICD-10-CM

## 2016-06-08 DIAGNOSIS — Z48812 Encounter for surgical aftercare following surgery on the circulatory system: Secondary | ICD-10-CM

## 2016-06-08 LAB — VAS US CAROTID
LCCADDIAS: -23 cm/s
LCCADSYS: -89 cm/s
LCCAPDIAS: 25 cm/s
LEFT ECA DIAS: -19 cm/s
LICADDIAS: -21 cm/s
LICAPDIAS: 40 cm/s
Left CCA prox sys: 119 cm/s
Left ICA dist sys: -104 cm/s
Left ICA prox sys: 146 cm/s
RCCAPDIAS: 19 cm/s
RCCAPSYS: 138 cm/s
RIGHT CCA MID DIAS: 23 cm/s
RIGHT ECA DIAS: -19 cm/s
Right cca dist sys: -93 cm/s

## 2016-06-08 NOTE — Patient Instructions (Signed)
Stroke Prevention Some medical conditions and behaviors are associated with an increased chance of having a stroke. You may prevent a stroke by making healthy choices and managing medical conditions. HOW CAN I REDUCE MY RISK OF HAVING A STROKE?   Stay physically active. Get at least 30 minutes of activity on most or all days.  Do not smoke. It may also be helpful to avoid exposure to secondhand smoke.  Limit alcohol use. Moderate alcohol use is considered to be:  No more than 2 drinks per day for men.  No more than 1 drink per day for nonpregnant women.  Eat healthy foods. This involves:  Eating 5 or more servings of fruits and vegetables a day.  Making dietary changes that address high blood pressure (hypertension), high cholesterol, diabetes, or obesity.  Manage your cholesterol levels.  Making food choices that are high in fiber and low in saturated fat, trans fat, and cholesterol may control cholesterol levels.  Take any prescribed medicines to control cholesterol as directed by your health care provider.  Manage your diabetes.  Controlling your carbohydrate and sugar intake is recommended to manage diabetes.  Take any prescribed medicines to control diabetes as directed by your health care provider.  Control your hypertension.  Making food choices that are low in salt (sodium), saturated fat, trans fat, and cholesterol is recommended to manage hypertension.  Ask your health care provider if you need treatment to lower your blood pressure. Take any prescribed medicines to control hypertension as directed by your health care provider.  If you are 18-39 years of age, have your blood pressure checked every 3-5 years. If you are 40 years of age or older, have your blood pressure checked every year.  Maintain a healthy weight.  Reducing calorie intake and making food choices that are low in sodium, saturated fat, trans fat, and cholesterol are recommended to manage  weight.  Stop drug abuse.  Avoid taking birth control pills.  Talk to your health care provider about the risks of taking birth control pills if you are over 35 years old, smoke, get migraines, or have ever had a blood clot.  Get evaluated for sleep disorders (sleep apnea).  Talk to your health care provider about getting a sleep evaluation if you snore a lot or have excessive sleepiness.  Take medicines only as directed by your health care provider.  For some people, aspirin or blood thinners (anticoagulants) are helpful in reducing the risk of forming abnormal blood clots that can lead to stroke. If you have the irregular heart rhythm of atrial fibrillation, you should be on a blood thinner unless there is a good reason you cannot take them.  Understand all your medicine instructions.  Make sure that other conditions (such as anemia or atherosclerosis) are addressed. SEEK IMMEDIATE MEDICAL CARE IF:   You have sudden weakness or numbness of the face, arm, or leg, especially on one side of the body.  Your face or eyelid droops to one side.  You have sudden confusion.  You have trouble speaking (aphasia) or understanding.  You have sudden trouble seeing in one or both eyes.  You have sudden trouble walking.  You have dizziness.  You have a loss of balance or coordination.  You have a sudden, severe headache with no known cause.  You have new chest pain or an irregular heartbeat. Any of these symptoms may represent a serious problem that is an emergency. Do not wait to see if the symptoms will   go away. Get medical help at once. Call your local emergency services (911 in [place] Do not drive yourself to the hospital.   This information is not intended to replace advice given to you by your health care provider. Make sure you discuss any questions you have with your health care provider.   Document Released: 08/20/2004 Document Revised: 08/03/2014 Document Reviewed:  01/13/2013 Elsevier Interactive Patient Education 2016 [employer]    Peripheral Vascular Disease Peripheral vascular disease (PVD) is a disease of the blood vessels that are not part of your heart and brain. A simple term for PVD is poor circulation. In most cases, PVD narrows the blood vessels that carry blood from your heart to the rest of your body. This can result in a decreased supply of blood to your arms, legs, and internal organs, like your stomach or kidneys. However, it most often affects a person's lower legs and feet. There are two types of PVD.  Organic PVD. This is the more common type. It is caused by damage to the structure of blood vessels.  Functional PVD. This is caused by conditions that make blood vessels contract and tighten (spasm). Without treatment, PVD tends to get worse over time. PVD can also lead to acute ischemic limb. This is when an arm or limb suddenly has trouble getting enough blood. This is a medical emergency. CAUSES Each type of PVD has many different causes. The most common cause of PVD is buildup of a fatty material (plaque) inside of your arteries (atherosclerosis). Small amounts of plaque can break off from the walls of the blood vessels and become lodged in a smaller artery. This blocks blood flow and can cause acute ischemic limb. Other common causes of PVD include:  Blood clots that form inside of blood vessels.  Injuries to blood vessels.  Diseases that cause inflammation of blood vessels or cause blood vessel spasms.  Health behaviors and health history that increase your risk of developing PVD. RISK FACTORS  You may have a greater risk of PVD if you:  Have a family history of PVD.  Have certain medical conditions, including:  High cholesterol.  Diabetes.  High blood pressure (hypertension).  Coronary heart disease.  Past problems with blood clots.  Past injury, such as burns or a broken bone. These may have damaged blood  vessels in your limbs.  Buerger disease. This is caused by inflamed blood vessels in your hands and feet.  Some forms of arthritis.  Rare birth defects that affect the arteries in your legs.  Use tobacco.  Do not get enough exercise.  Are obese.  Are age 50 or older. SIGNS AND SYMPTOMS  PVD may cause many different symptoms. Your symptoms depend on what part of your body is not getting enough blood. Some common signs and symptoms include:  Cramps in your lower legs. This may be a symptom of poor leg circulation (claudication).  Pain and weakness in your legs while you are physically active that goes away when you rest (intermittent claudication).  Leg pain when at rest.  Leg numbness, tingling, or weakness.  Coldness in a leg or foot, especially when compared with the other leg.  Skin or hair changes. These can include:  Hair loss.  Shiny skin.  Pale or bluish skin.  Thick toenails.  Inability to get or maintain an erection (erectile dysfunction). People with PVD are more prone to developing ulcers and sores on their toes, feet, or legs. These may take longer than   normal to heal. DIAGNOSIS Your health care provider may diagnose PVD from your signs and symptoms. The health care provider will also do a physical exam. You may have tests to find out what is causing your PVD and determine its severity. Tests may include:  Blood pressure recordings from your arms and legs and measurements of the strength of your pulses (pulse volume recordings).  Imaging studies using sound waves to take pictures of the blood flow through your blood vessels (Doppler ultrasound).  Injecting a dye into your blood vessels before having imaging studies using:  X-rays (angiogram or arteriogram).  Computer-generated X-rays (CT angiogram).  A powerful electromagnetic field and a computer (magnetic resonance angiogram or MRA). TREATMENT Treatment for PVD depends on the cause of your condition  and the severity of your symptoms. It also depends on your age. Underlying causes need to be treated and controlled. These include long-lasting (chronic) conditions, such as diabetes, high cholesterol, and high blood pressure. You may need to first try making lifestyle changes and taking medicines. Surgery may be needed if these do not work. Lifestyle changes may include:  Quitting smoking.  Exercising regularly.  Following a low-fat, low-cholesterol diet. Medicines may include:  Blood thinners to prevent blood clots.  Medicines to improve blood flow.  Medicines to improve your blood cholesterol levels. Surgical procedures may include:  A procedure that uses an inflated balloon to open a blocked artery and improve blood flow (angioplasty).  A procedure to put in a tube (stent) to keep a blocked artery open (stent implant).  Surgery to reroute blood flow around a blocked artery (peripheral bypass surgery).  Surgery to remove dead tissue from an infected wound on the affected limb.  Amputation. This is surgical removal of the affected limb. This may be necessary in cases of acute ischemic limb that are not improved through medical or surgical treatments. HOME CARE INSTRUCTIONS  Take medicines only as directed by your health care provider.  Do not use any tobacco products, including cigarettes, chewing tobacco, or electronic cigarettes. If you need help quitting, ask your health care provider.  Lose weight if you are overweight, and maintain a healthy weight as directed by your health care provider.  Eat a diet that is low in fat and cholesterol. If you need help, ask your health care provider.  Exercise regularly. Ask your health care provider to suggest some good activities for you.  Use compression stockings or other mechanical devices as directed by your health care provider.  Take good care of your feet.  Wear comfortable shoes that fit well.  Check your feet often for  any cuts or sores. SEEK MEDICAL CARE IF:  You have cramps in your legs while walking.  You have leg pain when you are at rest.  You have coldness in a leg or foot.  Your skin changes.  You have erectile dysfunction.  You have cuts or sores on your feet that are not healing. SEEK IMMEDIATE MEDICAL CARE IF:  Your arm or leg turns cold and blue.  Your arms or legs become red, warm, swollen, painful, or numb.  You have chest pain or trouble breathing.  You suddenly have weakness in your face, arm, or leg.  You become very confused or lose the ability to speak.  You suddenly have a very bad headache or lose your vision.   This information is not intended to replace advice given to you by your health care provider. Make sure you discuss any questions   you have with your health care provider.   Document Released: 08/20/2004 Document Revised: 08/03/2014 Document Reviewed: 12/21/2013 Elsevier Interactive Patient Education 2016 [employer] Inc[employer]     Steps to Quit Smoking  Smoking tobacco can be harmful to your health and can affect almost every organ in your body. Smoking puts you, and those around you, at risk for developing many serious chronic diseases. Quitting smoking is difficult, but it is one of the best things that you can do for your health. It is never too late to quit. WHAT ARE THE BENEFITS OF QUITTING SMOKING? When you quit smoking, you lower your risk of developing serious diseases and conditions, such as:  Lung cancer or lung disease, such as COPD.  Heart disease.  Stroke.  Heart attack.  Infertility.  Osteoporosis and bone fractures. Additionally, symptoms such as coughing, wheezing, and shortness of breath may get better when you quit. You may also find that you get sick less often because your body is stronger at fighting off colds and infections. If you are pregnant, quitting smoking can help to reduce your chances of having a baby of low birth weight. HOW DO  I GET READY TO QUIT? When you decide to quit smoking, create a plan to make sure that you are successful. Before you quit:  Pick a date to quit. Set a date within the next two weeks to give you time to prepare.  Write down the reasons why you are quitting. Keep this list in places where you will see it often, such as on your bathroom mirror or in your car or wallet.  Identify the people, places, things, and activities that make you want to smoke (triggers) and avoid them. Make sure to take these actions:  Throw away all cigarettes at home, at work, and in your car.  Throw away smoking accessories, such as ashtrays and lighters.  Clean your car and make sure to empty the ashtray.  Clean your home, including curtains and carpets.  Tell your family, friends, and coworkers that you are quitting. Support from your loved ones can make quitting easier.  Talk with your health care provider about your options for quitting smoking.  Find out what treatment options are covered by your health insurance. WHAT STRATEGIES CAN I USE TO QUIT SMOKING?  Talk with your healthcare provider about different strategies to quit smoking. Some strategies include:  Quitting smoking altogether instead of gradually lessening how much you smoke over a period of time. Research shows that quitting "cold turkey" is more successful than gradually quitting.  Attending in-person counseling to help you build problem-solving skills. You are more likely to have success in quitting if you attend several counseling sessions. Even short sessions of 10 minutes can be effective.  Finding resources and support systems that can help you to quit smoking and remain smoke-free after you quit. These resources are most helpful when you use them often. They can include:  Online chats with a counselor.  Telephone quitlines.  Printed self-help materials.  Support groups or group counseling.  Text messaging programs.  Mobile phone  applications.  Taking medicines to help you quit smoking. (If you are pregnant or breastfeeding, talk with your health care provider first.) Some medicines contain nicotine and some do not. Both types of medicines help with cravings, but the medicines that include nicotine help to relieve withdrawal symptoms. Your health care provider may recommend:  Nicotine patches, gum, or lozenges.  Nicotine inhalers or sprays.  Non-nicotine medicine that   is taken by mouth. Talk with your health care provider about combining strategies, such as taking medicines while you are also receiving in-person counseling. Using these two strategies together makes you more likely to succeed in quitting than if you used either strategy on its own. If you are pregnant or breastfeeding, talk with your health care provider about finding counseling or other support strategies to quit smoking. Do not take medicine to help you quit smoking unless told to do so by your health care provider. WHAT THINGS CAN I DO TO MAKE IT EASIER TO QUIT? Quitting smoking might feel overwhelming at first, but there is a lot that you can do to make it easier. Take these important actions:  Reach out to your family and friends and ask that they support and encourage you during this time. Call telephone quitlines, reach out to support groups, or work with a counselor for support.  Ask people who smoke to avoid smoking around you.  Avoid places that trigger you to smoke, such as bars, parties, or smoke-break areas at work.  Spend time around people who do not smoke.  Lessen stress in your life, because stress can be a smoking trigger for some people. To lessen stress, try:  Exercising regularly.  Deep-breathing exercises.  Yoga.  Meditating.  Performing a body scan. This involves closing your eyes, scanning your body from head to toe, and noticing which parts of your body are particularly tense. Purposefully relax the muscles in those  areas.  Download or purchase mobile phone or tablet apps (applications) that can help you stick to your quit plan by providing reminders, tips, and encouragement. There are many free apps, such as QuitGuide from the CDC (Centers for Disease Control and Prevention). You can find other support for quitting smoking (smoking cessation) through smokefree.gov and other websites. HOW WILL I FEEL WHEN I QUIT SMOKING? Within the first 24 hours of quitting smoking, you may start to feel some withdrawal symptoms. These symptoms are usually most noticeable 2-3 days after quitting, but they usually do not last beyond 2-3 weeks. Changes or symptoms that you might experience include:  Mood swings.  Restlessness, anxiety, or irritation.  Difficulty concentrating.  Dizziness.  Strong cravings for sugary foods in addition to nicotine.  Mild weight gain.  Constipation.  Nausea.  Coughing or a sore throat.  Changes in how your medicines work in your body.  A depressed mood.  Difficulty sleeping (insomnia). After the first 2-3 weeks of quitting, you may start to notice more positive results, such as:  Improved sense of smell and taste.  Decreased coughing and sore throat.  Slower heart rate.  Lower blood pressure.  Clearer skin.  The ability to breathe more easily.  Fewer sick days. Quitting smoking is very challenging for most people. Do not get discouraged if you are not successful the first time. Some people need to make many attempts to quit before they achieve long-term success. Do your best to stick to your quit plan, and talk with your health care provider if you have any questions or concerns.   This information is not intended to replace advice given to you by your health care provider. Make sure you discuss any questions you have with your health care provider.   Document Released: 07/07/2001 Document Revised: 11/27/2014 Document Reviewed: 11/27/2014 Elsevier Interactive Patient  Education 2016 [employer].  

## 2016-06-08 NOTE — Progress Notes (Signed)
[facility] [place] [facility] PHYSICAL   MRN : [id number]  History of Present Illness:   Norma Scott is a 65 y.o. female patient of Dr. Myra Scott who returns for followup of 2 problems: Carotid stenosis, and lower extremity vascular disease. She is status post right carotid endarterectomy in July of 2010 for asymptomatic stenosis.  She had lumbar spine surgery Nov., 2013, states her right foot is often weak, but her legs feel stronger since the back surgery and she rarely falls compared to before her back surgery when she had frequent falls.  She still has throbbing in left thigh and calf after walking 1/4 of a block or less, relieved by rest, no claudication in right leg with walking, states she has increased her walking. She does yard work daily.  Patient denies non healing ulcers on lower extremities.  Pt states she has had 3 TIA's in the last year as manifested by tingling on both sides of lips, blurred vision in both eyes, and slurred speech,  sx's lasted less than an hour.  Last TIA was a few months ago. Was evaluated by a neurologist in [place], Dr. Everlena Scott, since her Norma Scott's; she last saw him in April 2017. She is ambidextrous.    Pt Diabetic: No  Pt smoker: smoker (2/3 ppd/day x 40+ yrs)  Pt meds include: Statin :Yes ASA: no Other anticoagulants/antiplatelets: Plavix started since on of her TIA's in 2016    Current Outpatient Prescriptions  Medication Sig Dispense Refill  . alendronate (FOSAMAX) 70 MG tablet     . atorvastatin (LIPITOR) 40 MG tablet Take 80 mg by mouth daily.    . clonazePAM (KLONOPIN) 0.5 MG tablet Take 0.5 mg by mouth 2 (two) times daily.     . clopidogrel (PLAVIX) 75 MG tablet TAKE ONE TABLET BY MOUTH ONCE DAILY 30 tablet 7  . cyclobenzaprine (FLEXERIL) 10 MG tablet Take 0.5-1 tablets (5-10 mg total) by mouth 3 (three) times daily as needed for muscle spasms. 50 tablet 1  . FLUoxetine (PROZAC) 20 MG capsule Take 60 mg  by mouth daily.     Marland Kitchen. gabapentin (NEURONTIN) 300 MG capsule Take 300 mg by mouth 3 (three) times daily.    Marland Kitchen. lisinopril (PRINIVIL,ZESTRIL) 5 MG tablet Take 5 mg by mouth daily.    . metoprolol tartrate (LOPRESSOR) 25 MG tablet Take 25 mg by mouth 2 (two) times daily.    Marland Kitchen. omeprazole (PRILOSEC) 20 MG capsule Take 20 mg by mouth 2 (two) times daily as needed. For reflux    . aspirin 81 MG tablet Take 81 mg by mouth daily.     No current facility-administered medications for this visit.     Past Medical History:  Diagnosis Date  . Allergy    allergic rhinitis  . Arteriosclerotic coronary artery disease 05/21/2015  . Asthma   . CAD (coronary artery disease)    patient says no problems   dr Norma Scott pcp  . Carotid artery occlusion   . Cervical disc displacement    x2  . COPD (chronic obstructive pulmonary disease) (HCC)   . Esophageal reflux   . Essential hypertension 05/21/2015  . Family history of adverse reaction to anesthesia    mother hard to wake and affected b/p  . Fibromyalgia   . Gall stones   . Generalized anxiety disorder   . Hyperlipidemia   . Lumbar disc herniation    L5-S1  . Mini stroke (HCC)   . TIA (transient  ischemic attack) 05/21/2015    Social History Social History  Substance Use Topics  . Smoking status: Current Some Day Smoker    Packs/day: 0.50    Years: 45.00    Types: Cigarettes  . Smokeless tobacco: Never Used     Comment: states she only smokes about 1/2 pack  . Alcohol use 0.0 oz/week     Comment: 2 times a year    Family History Family History  Problem Relation Age of Onset  . Stroke Mother   . Hypertension Mother   . Hyperlipidemia Mother   . Seizures Mother     Grand mal seizures  . Diabetes Father   . Heart disease Father     Heart Disease before age 64  . Stroke Maternal Grandmother   . Heart Problems Maternal Grandmother   . Cancer Maternal Grandfather     stomach cancer  . Heart attack Maternal Grandfather   . Heart  disease Paternal Grandfather   . Heart attack Paternal Grandfather   . Deep vein thrombosis Brother   . Asthma Brother   . Cancer Paternal Grandmother [id number]  . Heart disease Paternal Uncle   . Heart attack Paternal Uncle     Surgical History Past Surgical History:  Procedure Laterality Date  . BACK SURGERY  06/01/2012   lumbar  . CAROTID ENDARTERECTOMY  02/01/2009   Right  . CERVICAL FUSION     1995, 1996  . CHOLECYSTECTOMY    . CHOLECYSTECTOMY    . ECTOPIC PREGNANCY SURGERY  93  . ERCP N/A 11/20/2015   Procedure: ENDOSCOPIC RETROGRADE CHOLANGIOPANCREATOGRAPHY (ERCP);  Surgeon: Willis ModenaWilliam Outlaw, MD;  [facility];  Service: Endoscopy;  Laterality: N/A;  . EUS N/A 11/20/2015   Procedure: UPPER ENDOSCOPIC ULTRASOUND (EUS) RADIAL;  Surgeon: Willis ModenaWilliam Outlaw, MD;  Location: [facility];  Service: Endoscopy;  Laterality: N/A;  . TUBAL LIGATION      Allergies  Allergen Reactions  . Wellbutrin [Bupropion] Other (See Comments)    Hallucinations  . Septra [Sulfamethoxazole-Trimethoprim] Nausea And Vomiting    Vomiting  . Sulfa Antibiotics Nausea And Vomiting  . Zithromax [Azithromycin] Hives  . Cymbalta [Duloxetine Hcl] Other (See Comments)    fatigue    Current Outpatient Prescriptions  Medication Sig Dispense Refill  . alendronate (FOSAMAX) 70 MG tablet     . atorvastatin (LIPITOR) 40 MG tablet Take 80 mg by mouth daily.    . clonazePAM (KLONOPIN) 0.5 MG tablet Take 0.5 mg by mouth 2 (two) times daily.     . clopidogrel (PLAVIX) 75 MG tablet TAKE ONE TABLET BY MOUTH ONCE DAILY 30 tablet 7  . cyclobenzaprine (FLEXERIL) 10 MG tablet Take 0.5-1 tablets (5-10 mg total) by mouth 3 (three) times daily as needed for muscle spasms. 50 tablet 1  . FLUoxetine (PROZAC) 20 MG capsule Take 60 mg by mouth daily.     Marland Kitchen. gabapentin (NEURONTIN) 300 MG capsule Take 300 mg by mouth 3 (three) times daily.    Marland Kitchen. lisinopril (PRINIVIL,ZESTRIL) 5 MG tablet Take 5 mg by mouth daily.    . metoprolol  tartrate (LOPRESSOR) 25 MG tablet Take 25 mg by mouth 2 (two) times daily.    Marland Kitchen. omeprazole (PRILOSEC) 20 MG capsule Take 20 mg by mouth 2 (two) times daily as needed. For reflux    . aspirin 81 MG tablet Take 81 mg by mouth daily.     No current facility-administered medications for this visit.      REVIEW OF SYSTEMS: See HPI  for pertinent positives and negatives.  Physical Examination Vitals:   06/08/16 1213 06/08/16 1214  BP: (!) 164/86 (!) 157/86  Pulse: 60   Resp: 18   Temp: 97.5 F (36.4 C)   TempSrc: Oral   SpO2: 94%   Weight: 198 lb 6.4 oz (90 kg)   Height: 5' (1.524 m)    Body mass index is 38.75 kg/m.  General: WDWN obese female in NAD  Gait: Normal  HENT: WNL  Eyes: Pupils equal  Pulmonary: non-labored breathing with moist cough, moist sounding exhalations, limited air movement in all fields Cardiac: RRR, no murmur detected Abdomen: soft, NT, no palpated masses  Skin: no rashes, no ulcers;  no cellulitis.  VASCULAR EXAM  Carotid Bruits  Left  Right    Negative  Negative   Aorta is not palpable  Bilateral radial pulses are 2+ palpable.   Extremities with no signs of ischemia, without Gangrene; without open wounds. Pitting and non pitting edema in both pretibial areas: 1+ right, 2+ left  LE Pulses  LEFT  RIGHT   FEMORAL  not palpable  not palpable   POPLITEAL  not palpable  not palpable   POSTERIOR TIBIAL  not palpable  not palpable   DORSALIS PEDIS  ANTERIOR TIBIAL  not palpable  not palpable    Musculoskeletal: no muscle wasting or atrophy; no edema  Neurologic: A&O X 3; Appropriate Affect ;  SENSATION: normal;  MOTOR FUNCTION: 5/5 Symmetric, CN 2-12 intact  Speech is fluent/normal    06/05/15 MRA of head/neck: IMPRESSION: 1. 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 2. Mild white matter disease likely reflects chronic microvascular ischemic change. 3. Mild atherosclerotic changes at the  carotid bifurcations bilaterally without significant stenoses of greater than 50%.     ASSESSMENT:  Norma Scott is a 65 y.o. female who is s/p right carotid endarterectomy 02/01/2009 and has moderate left ilio-femoral arterial occlusive disease. She had more TIA's in the last year, is seeing a neurologist. See MRA of head/neck above. She has moderate claudication in her left leg, no signs of ischemia in her feet/legs.  Her atherosclerotic risk factors include active smoking for over 40 years and obesity. She takes Plavix, a statin, and a beta blocker.   I advised her to see her PCP re her elevated blood pressure.  DATA  Today's carotid duplex suggests right ICA (CEA site) with no restenosis Left ICA with 40-59% stenosis. Bilateral vertebral artery flow are antegrade. Bilateral subclavian artery waveforms are normal.  No significant change compared to 12/07/15.  ABI's remain stable:  normal in the right with triphasic waveforms and moderate decrease in arterial perfusion in the left with biphasic waveforms.    PLAN:   The patient was counseled re smoking cessation and given several free resources re smoking cessation.  Graduated walking program discussed and how to achieve.    Based on today's exam and non-invasive vascular lab results, the patient will follow up in 1 year with the following tests: ABI's and carotid duplex. I discussed in depth with the patient the nature of atherosclerosis, and emphasized the importance of maximal medical management including strict control of blood pressure, blood glucose, and lipid levels, obtaining regular exercise, and cessation of smoking.  The patient is aware that without maximal medical management the underlying atherosclerotic disease process will progress, limiting the benefit of any interventions.  The patient was given information about stroke prevention and what symptoms should prompt the patient to seek immediate  medical  care.  The patient was given information about PAD including signs, symptoms, treatment, what symptoms should prompt the patient to seek immediate medical care, and risk reduction measures to take. Thank you for allowing [place] to participate in this patient's care.  Charisse March, RN, MSN, FNP-C [facility] Office: [phone]  Clinic MD: Imogene Burn on call 06/08/2016 12:20 PM

## 2016-06-15 DIAGNOSIS — M797 Fibromyalgia: Secondary | ICD-10-CM | POA: Diagnosis not present

## 2016-06-15 DIAGNOSIS — F17210 Nicotine dependence, cigarettes, uncomplicated: Secondary | ICD-10-CM | POA: Diagnosis not present

## 2016-06-15 DIAGNOSIS — E7800 Pure hypercholesterolemia, unspecified: Secondary | ICD-10-CM | POA: Diagnosis not present

## 2016-06-15 DIAGNOSIS — I10 Essential (primary) hypertension: Secondary | ICD-10-CM | POA: Diagnosis not present

## 2016-06-15 DIAGNOSIS — I739 Peripheral vascular disease, unspecified: Secondary | ICD-10-CM | POA: Diagnosis not present

## 2016-06-15 DIAGNOSIS — R609 Edema, unspecified: Secondary | ICD-10-CM | POA: Diagnosis not present

## 2016-06-15 DIAGNOSIS — Z23 Encounter for immunization: Secondary | ICD-10-CM | POA: Diagnosis not present

## 2016-06-15 DIAGNOSIS — F411 Generalized anxiety disorder: Secondary | ICD-10-CM | POA: Diagnosis not present

## 2016-07-02 ENCOUNTER — Emergency Department (HOSPITAL_COMMUNITY): Payer: Medicare Other

## 2016-07-02 ENCOUNTER — Encounter (HOSPITAL_COMMUNITY): Payer: Self-pay | Admitting: Emergency Medicine

## 2016-07-02 ENCOUNTER — Observation Stay (HOSPITAL_COMMUNITY)
Admission: EM | Admit: 2016-07-02 | Discharge: 2016-07-04 | Disposition: A | Discharge status: Home / Self Care | Payer: Medicare Other | Attending: Internal Medicine | Admitting: Internal Medicine

## 2016-07-02 DIAGNOSIS — J441 Chronic obstructive pulmonary disease with (acute) exacerbation: Secondary | ICD-10-CM | POA: Diagnosis present

## 2016-07-02 DIAGNOSIS — R0602 Shortness of breath: Secondary | ICD-10-CM | POA: Diagnosis not present

## 2016-07-02 DIAGNOSIS — I739 Peripheral vascular disease, unspecified: Secondary | ICD-10-CM | POA: Diagnosis not present

## 2016-07-02 DIAGNOSIS — K219 Gastro-esophageal reflux disease without esophagitis: Secondary | ICD-10-CM | POA: Insufficient documentation

## 2016-07-02 DIAGNOSIS — J101 Influenza due to other identified influenza virus with other respiratory manifestations: Secondary | ICD-10-CM | POA: Insufficient documentation

## 2016-07-02 DIAGNOSIS — F17210 Nicotine dependence, cigarettes, uncomplicated: Secondary | ICD-10-CM | POA: Diagnosis not present

## 2016-07-02 DIAGNOSIS — E871 Hypo-osmolality and hyponatremia: Secondary | ICD-10-CM | POA: Diagnosis not present

## 2016-07-02 DIAGNOSIS — Z79899 Other long term (current) drug therapy: Secondary | ICD-10-CM | POA: Insufficient documentation

## 2016-07-02 DIAGNOSIS — E785 Hyperlipidemia, unspecified: Secondary | ICD-10-CM | POA: Diagnosis not present

## 2016-07-02 DIAGNOSIS — I10 Essential (primary) hypertension: Secondary | ICD-10-CM | POA: Insufficient documentation

## 2016-07-02 DIAGNOSIS — Z8673 Personal history of transient ischemic attack (TIA), and cerebral infarction without residual deficits: Secondary | ICD-10-CM | POA: Insufficient documentation

## 2016-07-02 DIAGNOSIS — I2510 Atherosclerotic heart disease of native coronary artery without angina pectoris: Secondary | ICD-10-CM | POA: Insufficient documentation

## 2016-07-02 DIAGNOSIS — R05 Cough: Secondary | ICD-10-CM | POA: Diagnosis not present

## 2016-07-02 DIAGNOSIS — Z7902 Long term (current) use of antithrombotics/antiplatelets: Secondary | ICD-10-CM | POA: Diagnosis not present

## 2016-07-02 DIAGNOSIS — F329 Major depressive disorder, single episode, unspecified: Secondary | ICD-10-CM | POA: Diagnosis not present

## 2016-07-02 DIAGNOSIS — J9601 Acute respiratory failure with hypoxia: Secondary | ICD-10-CM | POA: Diagnosis not present

## 2016-07-02 DIAGNOSIS — Z7982 Long term (current) use of aspirin: Secondary | ICD-10-CM | POA: Insufficient documentation

## 2016-07-02 LAB — CBC
HCT: 43.5 % (ref 36.0–46.0)
HEMOGLOBIN: 14.6 g/dL (ref 12.0–15.0)
MCH: 29 pg (ref 26.0–34.0)
MCHC: 33.6 g/dL (ref 30.0–36.0)
MCV: 86.5 fL (ref 78.0–100.0)
Platelets: 197 10*3/uL (ref 150–400)
RBC: 5.03 MIL/uL (ref 3.87–5.11)
RDW: 13.5 % (ref 11.5–15.5)
WBC: 9.8 10*3/uL (ref 4.0–10.5)

## 2016-07-02 LAB — BASIC METABOLIC PANEL
ANION GAP: 10 (ref 5–15)
BUN: 5 mg/dL — ABNORMAL LOW (ref 6–20)
CALCIUM: 9.2 mg/dL (ref 8.9–10.3)
CO2: 28 mmol/L (ref 22–32)
Chloride: 95 mmol/L — ABNORMAL LOW (ref 101–111)
Creatinine, Ser: 0.81 mg/dL (ref 0.44–1.00)
GFR calc non Af Amer: >60 mL/min (ref >60)
Glucose, Bld: 113 mg/dL — ABNORMAL HIGH (ref 65–99)
Potassium: 3.7 mmol/L (ref 3.5–5.1)
Sodium: 133 mmol/L — ABNORMAL LOW (ref 135–145)

## 2016-07-02 LAB — I-STAT TROPONIN, ED: Troponin i, poc: 0.01 ng/mL (ref 0.00–0.08)

## 2016-07-02 LAB — BRAIN NATRIURETIC PEPTIDE: B Natriuretic Peptide: 162 pg/mL — ABNORMAL HIGH (ref 0.0–100.0)

## 2016-07-02 MED ORDER — PREDNISONE 20 MG PO TABS
60.0000 mg | ORAL_TABLET | Freq: Once | ORAL | Status: AC
  Administered 2016-07-02: 60 mg via ORAL
  Filled 2016-07-02: qty 3

## 2016-07-02 MED ORDER — DOXYCYCLINE HYCLATE 100 MG PO TABS
100.0000 mg | ORAL_TABLET | Freq: Once | ORAL | Status: AC
  Administered 2016-07-03: 100 mg via ORAL
  Filled 2016-07-02: qty 1

## 2016-07-02 MED ORDER — IPRATROPIUM-ALBUTEROL 0.5-2.5 (3) MG/3ML IN SOLN
3.0000 mL | Freq: Once | RESPIRATORY_TRACT | Status: AC
Start: 2016-07-02 — End: 2016-07-02
  Administered 2016-07-02: 3 mL via RESPIRATORY_TRACT
  Filled 2016-07-02: qty 3

## 2016-07-02 NOTE — ED Triage Notes (Signed)
Patient with fevers of 102 at home, she has been feeling dizzy.  Patient states that it started today.  Patient states she has not taken any medications for her fevers.  Patient states she has been having some shortness of breath with this.

## 2016-07-02 NOTE — ED Provider Notes (Signed)
Emergency Department Provider Note   I have reviewed the triage vital signs and the nursing notes.   HISTORY  Chief Complaint Shortness of Breath; Fever; and Dizziness   HPI Norma Scott is a 65 y.o. female with PMH of CAD, COPD, HTN, and HLD presents to the emergency room for evaluation of fever and difficulty breathing over the last 2 days. Patient denies any associated chest pain. She does have a productive cough. She is a history of COPD and continues to actively smoke cigarettes. She does not wear oxygen at home. She is followed by pulmonology for reported pulmonary HTN. She does not use albuterol inhalers or other medications. She states that she didn't the distant past but no longer uses them. No dysuria. Denies abdominal discomfort. No back pain.    Past Medical History:  Diagnosis Date  . Allergy    allergic rhinitis  . Arteriosclerotic coronary artery disease 05/21/2015  . Asthma   . CAD (coronary artery disease)    patient says no problems   dr Williemae Nattercandice smith pcp  . Carotid artery occlusion   . Cervical disc displacement    x2  . COPD (chronic obstructive pulmonary disease) (HCC)   . Esophageal reflux   . Essential hypertension 05/21/2015  . Family history of adverse reaction to anesthesia    mother hard to wake and affected b/p  . Fibromyalgia   . Gall stones   . Generalized anxiety disorder   . Hyperlipidemia   . Lumbar disc herniation    L5-S1  . Mini stroke (HCC)   . TIA (transient ischemic attack) 05/21/2015    Patient Active Problem List   Diagnosis Date Noted  . COPD exacerbation (HCC) 07/03/2016  . TIA (transient ischemic attack) 05/21/2015  . Essential hypertension 05/21/2015  . Hyperlipidemia 05/21/2015  . Arteriosclerotic coronary artery disease 05/21/2015  . Aftercare following surgery of the circulatory system, NEC 01/22/2014  . Pain in limb-Bilateral shoulder Right > left 05/15/2013  . Occlusion and stenosis of carotid artery  without mention of cerebral infarction 05/16/2012  . Peripheral vascular disease, unspecified 05/16/2012  . PAD (peripheral artery disease) (HCC) 02/08/2012    Past Surgical History:  Procedure Laterality Date  . BACK SURGERY  06/01/2012   lumbar  . CAROTID ENDARTERECTOMY  02/01/2009   Right  . CERVICAL FUSION     1995, 1996  . CHOLECYSTECTOMY    . CHOLECYSTECTOMY    . ECTOPIC PREGNANCY SURGERY  93  . ERCP N/A 11/20/2015   Procedure: ENDOSCOPIC RETROGRADE CHOLANGIOPANCREATOGRAPHY (ERCP);  Surgeon: Willis ModenaWilliam Outlaw, MD;  [facility];  Service: Endoscopy;  Laterality: N/A;  . EUS N/A 11/20/2015   Procedure: UPPER ENDOSCOPIC ULTRASOUND (EUS) RADIAL;  Surgeon: Willis ModenaWilliam Outlaw, MD;  Location: [facility];  Service: Endoscopy;  Laterality: N/A;  . TUBAL LIGATION      Current Outpatient Rx  . Order #: [id number] Class: Historical Med  . Order #: [id number] Class: Historical Med  . Order #: [id number] Class: Historical Med  . Order #: [id number] Class: Normal  . Order #: [id number] Class: Print  . Order #: [id number] Class: Historical Med  . Order #: [id number] Class: Historical Med  . Order #: [id number] Class: Historical Med  . Order #: [id number] Class: Historical Med  . Order #: [id number] Class: Historical Med  . Order #: [id number] Class: Historical Med    Allergies Wellbutrin [bupropion]; Septra [sulfamethoxazole-trimethoprim]; Sulfa antibiotics; Zithromax [azithromycin]; and Cymbalta [duloxetine hcl]  Family History  Problem Relation Age of Onset  . Stroke Mother   . Hypertension Mother   .  Hyperlipidemia Mother   . Seizures Mother     Grand mal seizures  . Diabetes Father   . Heart disease Father     Heart Disease before age 25  . Stroke Maternal Grandmother   . Heart Problems Maternal Grandmother   . Cancer Maternal Grandfather     stomach cancer  . Heart attack Maternal Grandfather   . Heart disease Paternal Grandfather   . Heart attack Paternal Grandfather   . Deep vein  thrombosis Brother   . Asthma Brother   . Cancer Paternal Grandmother 64  . Heart disease Paternal Uncle   . Heart attack Paternal Uncle     Social History Social History  Substance Use Topics  . Smoking status: Current Some Day Smoker    Packs/day: 0.50    Years: 45.00    Types: Cigarettes  . Smokeless tobacco: Never Used     Comment: states she only smokes about 1/2 pack  . Alcohol use 0.0 oz/week     Comment: 2 times a year    Review of Systems  Constitutional: Positive fever.  Eyes: No visual changes. ENT: No sore throat. Cardiovascular: Denies chest pain. Respiratory: Positive shortness of breath. Gastrointestinal: No abdominal pain.  No nausea, no vomiting.  No diarrhea.  No constipation. Genitourinary: Negative for dysuria. Musculoskeletal: Negative for back pain. Skin: Negative for rash. Neurological: Negative for headaches, focal weakness or numbness.  10-point ROS otherwise negative.  ____________________________________________   PHYSICAL EXAM:  VITAL SIGNS: ED Triage Vitals [07/02/16 2120]  Enc Vitals Group     BP 172/86     Pulse Rate 99     Resp 20     Temp 98.4 F (36.9 C)     Temp Source Oral     SpO2 91 %     Weight 198 lb (89.8 kg)     Height 5' (1.524 m)   Constitutional: Alert and oriented. Well appearing and in no acute distress. Eyes: Conjunctivae are normal. Head: Atraumatic. Nose: No congestion/rhinnorhea. Mouth/Throat: Mucous membranes are moist.  Oropharynx non-erythematous. Neck: No stridor.   Cardiovascular: Normal rate, regular rhythm. Good peripheral circulation. Grossly normal heart sounds.   Respiratory: Normal respiratory effort.  No retractions. Lungs diminished throughout with end-expiratory wheezing bilaterally.  Gastrointestinal: Soft and nontender. No distention.  Musculoskeletal: No lower extremity tenderness nor edema. No gross deformities of extremities. Neurologic:  Normal speech and language. No gross focal  neurologic deficits are appreciated.  Skin:  Skin is warm, dry and intact. No rash noted.   ____________________________________________   LABS (all labs ordered are listed, but only abnormal results are displayed)  Labs Reviewed  BASIC METABOLIC PANEL - Abnormal; Notable for the following:       Result Value   Sodium 133 (*)    Chloride 95 (*)    Glucose, Bld 113 (*)    BUN 5 (*)    All other components within normal limits  BRAIN NATRIURETIC PEPTIDE - Abnormal; Notable for the following:    B Natriuretic Peptide 162.0 (*)    All other components within normal limits  CBC  INFLUENZA PANEL BY PCR (TYPE A & B, H1N1)  I-STAT TROPOININ, ED   ____________________________________________  EKG   EKG Interpretation  Date/Time:  Thursday July 02 2016 21:26:05 EST Ventricular Rate:  96 PR Interval:  118 QRS Duration: 76 QT Interval:  338 QTC Calculation: 427 R Axis:   70 Text Interpretation:  Normal sinus rhythm Septal infarct , age undetermined Abnormal  ECG No STEMI.  Confirmed by Shon Indelicato MD, Binnie Droessler [phone]) on 07/02/2016 10:57:22 PM       ____________________________________________  RADIOLOGY  Dg Chest 2 View  Result Date: 07/02/2016 CLINICAL DATA:  Cough and shortness of breath today. EXAM: CHEST  2 VIEW COMPARISON:  Radiographs 10/23/2013 FINDINGS: The cardiomediastinal contours are normal. There is bronchial thickening and interstitial coarsening that is new. Biapical pleuroparenchymal scarring. No confluent airspace disease. No pleural effusion or pneumothorax. No acute osseous abnormalities are seen. IMPRESSION: Bronchial thickening and interstitial coarsening, new, suggesting acute bronchitis. Pulmonary edema is felt less likely. Electronically Signed   By: Rubye OaksMelanie  Ehinger M.D.   On: 07/02/2016 22:16    ____________________________________________   PROCEDURES  Procedure(s) performed:   Procedures   ____________________________________________   INITIAL  IMPRESSION / ASSESSMENT AND PLAN / ED COURSE  Pertinent labs & imaging results that were available during my care of the patient were reviewed by me and considered in my medical decision making (see chart for details).  Patient resents emergency department for evaluation of fever or difficulty breathing, productive cough. Patient is a history of COPD and reported pulmonary artery hypertension. She is not on breathing medications at home. She continues to actively smoke. She does not have an oxygen requirement. During the interview I turned off the patient's oxygen and she dropped to 87% on room air with talking and became noticeably more dyspneic. Plan for steroids, albuterol, and reassessment. No clear evidence of bacterial infection on chest x-ray. The patient's urine symptoms.  Discussed patient's case with hospitalist, Dr. Maryfrances Bunnellanford.  Recommend admission to med-surg, obs bed.  I will place holding orders per their request. Patient and family (if present) updated with plan. Care transferred to hospitalist service.  I reviewed all nursing notes, vitals, pertinent old records, EKGs, labs, imaging (as available).  ____________________________________________  FINAL CLINICAL IMPRESSION(S) / ED DIAGNOSES  Final diagnoses:  COPD exacerbation (HCC)     MEDICATIONS GIVEN DURING THIS VISIT:  Medications  doxycycline (VIBRA-TABS) tablet 100 mg (not administered)  ipratropium-albuterol (DUONEB) 0.5-2.5 (3) MG/3ML nebulizer solution 3 mL (3 mLs Nebulization Given 07/02/16 2301)  predniSONE (DELTASONE) tablet 60 mg (60 mg Oral Given 07/02/16 2301)     NEW OUTPATIENT MEDICATIONS STARTED DURING THIS VISIT:  None   Note:  This document was prepared using Dragon voice recognition software and may include unintentional dictation errors.  Alona BeneJoshua Cleofas Hudgins, MD Emergency Medicine   Maia PlanJoshua G Bryli Mantey, MD 07/03/16 [phone]

## 2016-07-03 ENCOUNTER — Encounter (HOSPITAL_COMMUNITY): Payer: Self-pay | Admitting: Family Medicine

## 2016-07-03 DIAGNOSIS — J9601 Acute respiratory failure with hypoxia: Secondary | ICD-10-CM

## 2016-07-03 DIAGNOSIS — I10 Essential (primary) hypertension: Secondary | ICD-10-CM | POA: Diagnosis not present

## 2016-07-03 DIAGNOSIS — I739 Peripheral vascular disease, unspecified: Secondary | ICD-10-CM

## 2016-07-03 DIAGNOSIS — J441 Chronic obstructive pulmonary disease with (acute) exacerbation: Secondary | ICD-10-CM | POA: Diagnosis not present

## 2016-07-03 DIAGNOSIS — E871 Hypo-osmolality and hyponatremia: Secondary | ICD-10-CM | POA: Diagnosis not present

## 2016-07-03 LAB — BASIC METABOLIC PANEL
Anion gap: 10 (ref 5–15)
BUN: 5 mg/dL — ABNORMAL LOW (ref 6–20)
CALCIUM: 8.8 mg/dL — AB (ref 8.9–10.3)
CO2: 29 mmol/L (ref 22–32)
CREATININE: 0.83 mg/dL (ref 0.44–1.00)
Chloride: 97 mmol/L — ABNORMAL LOW (ref 101–111)
GFR calc non Af Amer: >60 mL/min (ref >60)
Glucose, Bld: 174 mg/dL — ABNORMAL HIGH (ref 65–99)
Potassium: 3.8 mmol/L (ref 3.5–5.1)
SODIUM: 136 mmol/L (ref 135–145)

## 2016-07-03 LAB — OSMOLALITY: OSMOLALITY: 291 mosm/kg (ref 275–295)

## 2016-07-03 LAB — CBC
HCT: 41.3 % (ref 36.0–46.0)
Hemoglobin: 13.2 g/dL (ref 12.0–15.0)
MCH: 28 pg (ref 26.0–34.0)
MCHC: 32 g/dL (ref 30.0–36.0)
MCV: 87.5 fL (ref 78.0–100.0)
PLATELETS: 173 10*3/uL (ref 150–400)
RBC: 4.72 MIL/uL (ref 3.87–5.11)
RDW: 13.8 % (ref 11.5–15.5)
WBC: 6.6 10*3/uL (ref 4.0–10.5)

## 2016-07-03 LAB — INFLUENZA PANEL BY PCR (TYPE A & B)
Influenza A By PCR: POSITIVE — AB
Influenza B By PCR: NEGATIVE

## 2016-07-03 MED ORDER — ALBUTEROL SULFATE (2.5 MG/3ML) 0.083% IN NEBU: 2.5000 mg | INHALATION_SOLUTION | RESPIRATORY_TRACT | Status: DC | PRN

## 2016-07-03 MED ORDER — PREDNISONE 20 MG PO TABS
40.0000 mg | ORAL_TABLET | Freq: Every day | ORAL | Status: DC
  Administered 2016-07-03 – 2016-07-04 (×2): 40 mg via ORAL
  Filled 2016-07-03: qty 2

## 2016-07-03 MED ORDER — ONDANSETRON HCL 4 MG PO TABS: 4.0000 mg | ORAL_TABLET | Freq: Four times a day (QID) | ORAL | Status: DC | PRN

## 2016-07-03 MED ORDER — ONDANSETRON HCL 4 MG/2ML IJ SOLN: 4.0000 mg | Freq: Four times a day (QID) | INTRAMUSCULAR | Status: DC | PRN

## 2016-07-03 MED ORDER — FLUOXETINE HCL 20 MG PO CAPS
60.0000 mg | ORAL_CAPSULE | Freq: Every day | ORAL | Status: DC
  Administered 2016-07-03 – 2016-07-04 (×2): 60 mg via ORAL
  Filled 2016-07-03 (×2): qty 3

## 2016-07-03 MED ORDER — ATORVASTATIN CALCIUM 80 MG PO TABS
80.0000 mg | ORAL_TABLET | Freq: Every day | ORAL | Status: DC
  Administered 2016-07-03 (×2): 80 mg via ORAL
  Filled 2016-07-03 (×2): qty 1

## 2016-07-03 MED ORDER — ACETAMINOPHEN 325 MG PO TABS: 650.0000 mg | ORAL_TABLET | Freq: Four times a day (QID) | ORAL | Status: DC | PRN

## 2016-07-03 MED ORDER — ACETAMINOPHEN 650 MG RE SUPP: 650.0000 mg | Freq: Four times a day (QID) | RECTAL | Status: DC | PRN

## 2016-07-03 MED ORDER — GABAPENTIN 300 MG PO CAPS
300.0000 mg | ORAL_CAPSULE | Freq: Three times a day (TID) | ORAL | Status: DC
  Administered 2016-07-03 – 2016-07-04 (×4): 300 mg via ORAL
  Filled 2016-07-03 (×4): qty 1

## 2016-07-03 MED ORDER — CLOPIDOGREL BISULFATE 75 MG PO TABS
75.0000 mg | ORAL_TABLET | Freq: Every day | ORAL | Status: DC
  Administered 2016-07-03 – 2016-07-04 (×2): 75 mg via ORAL
  Filled 2016-07-03 (×2): qty 1

## 2016-07-03 MED ORDER — ASPIRIN EC 81 MG PO TBEC
81.0000 mg | DELAYED_RELEASE_TABLET | Freq: Every day | ORAL | Status: DC
  Administered 2016-07-03: 81 mg via ORAL
  Filled 2016-07-03 (×2): qty 1

## 2016-07-03 MED ORDER — METOPROLOL TARTRATE 25 MG PO TABS
25.0000 mg | ORAL_TABLET | Freq: Two times a day (BID) | ORAL | Status: DC
  Administered 2016-07-03 (×2): 25 mg via ORAL
  Filled 2016-07-03 (×4): qty 1

## 2016-07-03 MED ORDER — CLONAZEPAM 0.5 MG PO TABS
0.5000 mg | ORAL_TABLET | Freq: Every evening | ORAL | Status: DC
  Administered 2016-07-03: 0.5 mg via ORAL
  Filled 2016-07-03: qty 1

## 2016-07-03 MED ORDER — OSELTAMIVIR PHOSPHATE 75 MG PO CAPS
75.0000 mg | ORAL_CAPSULE | Freq: Two times a day (BID) | ORAL | Status: DC
  Administered 2016-07-03 – 2016-07-04 (×4): 75 mg via ORAL
  Filled 2016-07-03 (×4): qty 1

## 2016-07-03 MED ORDER — LISINOPRIL 5 MG PO TABS
5.0000 mg | ORAL_TABLET | Freq: Every day | ORAL | Status: DC
  Administered 2016-07-03: 5 mg via ORAL
  Filled 2016-07-03 (×2): qty 1

## 2016-07-03 MED ORDER — ENOXAPARIN SODIUM 40 MG/0.4ML ~~LOC~~ SOLN
40.0000 mg | Freq: Every day | SUBCUTANEOUS | Status: DC
  Administered 2016-07-03 – 2016-07-04 (×2): 40 mg via SUBCUTANEOUS
  Filled 2016-07-03 (×2): qty 0.4

## 2016-07-03 MED ORDER — PANTOPRAZOLE SODIUM 40 MG PO TBEC
40.0000 mg | DELAYED_RELEASE_TABLET | Freq: Every day | ORAL | Status: DC
  Administered 2016-07-03 – 2016-07-04 (×2): 40 mg via ORAL
  Filled 2016-07-03 (×2): qty 1

## 2016-07-03 MED ORDER — ALBUTEROL SULFATE (2.5 MG/3ML) 0.083% IN NEBU
2.5000 mg | INHALATION_SOLUTION | Freq: Four times a day (QID) | RESPIRATORY_TRACT | Status: DC
  Administered 2016-07-03 – 2016-07-04 (×4): 2.5 mg via RESPIRATORY_TRACT
  Filled 2016-07-03 (×4): qty 3

## 2016-07-03 MED ORDER — SODIUM CHLORIDE 0.9 % IV SOLN
INTRAVENOUS | Status: AC
  Administered 2016-07-03: 02:00:00 via INTRAVENOUS

## 2016-07-03 MED ORDER — CYCLOBENZAPRINE HCL 10 MG PO TABS
5.0000 mg | ORAL_TABLET | Freq: Three times a day (TID) | ORAL | Status: DC
  Administered 2016-07-03 – 2016-07-04 (×3): 10 mg via ORAL
  Filled 2016-07-03 (×3): qty 1

## 2016-07-03 NOTE — Progress Notes (Signed)
Patient ID: Norma Scott, female   DOB: 11/27/1950, 65 y.o.   MRN: [id number]   Pt admitted after midnight. Please refer to admission note done 07/03/2016.  65 y.o. female with a past medical history significant for smoking, HTN and TIA who presented to [facility] with congestion, sore throat, malaise for past 24 hours prior to this admission. She also had fevers, chills, generalized body aches. She reported her grandson was sick. In ED, she was afebrile, HR 99, normal oxygen saturation. Blood work showed sodium 133, normal creatinine. CXR showed bronchitis. She was given steroids, bronchodilators and doxycycline for possible exacerbation of COPD. She was subsequently found to have influenza A and was started on tamiflu.   Assessment and Plan:  Acute respiratory failure with hypoxia / Influenza A / Acute COPD exacerbation  - Still hypoxic, 92% on 3 L [place] oxygen support - Influenza A positive - On Tamiflu - Continue current bronchodilators  Norma Scott [facility] [phone]

## 2016-07-03 NOTE — H&P (Signed)
History and Physical  Patient Name: Norma Scott     EAV:[id number]    DOB: [birth year]    DOA: 07/02/2016 PCP: Norma Found, MD   Patient coming from: Home  Chief Complaint: Malaise, cough, dyspnea  HPI: Norma Scott is a 65 y.o. female with a past medical history significant for smoking, HTN and TIA who presents with 1 day ILI.  The patient was in her usual state of health until the past couple days she has had some congestion and sore throat, which she picked up from her grandson. Then this morning, she woke up with malaise and throughout the day she developed fever and chills, malaise, body aches, and cough. This afternoon, family thought "her color was wrong", and she felt like she was more short of breath, so she came to the ER.  ED course: -Afebrile, heart rate 99, respirations 20, pulse ox normal, blood pressure 172/86 -Na 133, K 3.7, Cr 0.81, WBC 9.8K, Hgb 14.6 -Troponin negative, BNP mildly elevated -Chest x-ray showed coarse interstitial markings consistent with bronchitis -ECG was nonischemic -She was given steroids, bronchodilators and doxycycline for COPD flare and TRH were asked to evaluate     ROS: Review of Systems  Constitutional: Positive for chills, fever and malaise/fatigue.  HENT: Positive for congestion and sore throat.   Respiratory: Positive for cough and sputum production.   Musculoskeletal: Positive for myalgias.  Neurological: Positive for dizziness.  All other systems reviewed and are negative.         Past Medical History:  Diagnosis Date  . Allergy    allergic rhinitis  . Arteriosclerotic coronary artery disease 05/21/2015  . Asthma   . CAD (coronary artery disease)    patient says no problems   dr Williemae Natter smith pcp  . Carotid artery occlusion   . Cervical disc displacement    x2  . COPD (chronic obstructive pulmonary disease) (HCC)   . Esophageal reflux   . Essential hypertension 05/21/2015  . Family history of  adverse reaction to anesthesia    mother hard to wake and affected b/p  . Fibromyalgia   . Gall stones   . Generalized anxiety disorder   . Hyperlipidemia   . Lumbar disc herniation    L5-S1  . Mini stroke (HCC)   . TIA (transient ischemic attack) 05/21/2015    Past Surgical History:  Procedure Laterality Date  . BACK SURGERY  06/01/2012   lumbar  . CAROTID ENDARTERECTOMY  02/01/2009   Right  . CERVICAL FUSION     1995, 1996  . CHOLECYSTECTOMY    . CHOLECYSTECTOMY    . ECTOPIC PREGNANCY SURGERY  93  . ERCP N/A 11/20/2015   Procedure: ENDOSCOPIC RETROGRADE CHOLANGIOPANCREATOGRAPHY (ERCP);  Surgeon: Willis Modena, MD;  Location[facility];  Service: Endoscopy;  Laterality: N/A;  . EUS N/A 11/20/2015   Procedure: UPPER ENDOSCOPIC ULTRASOUND (EUS) RADIAL;  Surgeon: Willis Modena, MD;  Location: [facility];  Service: Endoscopy;  Laterality: N/A;  . TUBAL LIGATION      Social History: Patient lives with her three sons and grandson.  The patient walks unassisted.   She smokes, has been trying to quit with Chantix.   reports that she has been smoking Cigarettes.  She has a 22.50 pack-year smoking history. She has never used smokeless tobacco. She reports that she drinks alcohol. She reports that she does not use drugs.  Allergies  Allergen Reactions  . Wellbutrin [Bupropion] Other (See Comments)    Hallucinations  .  Septra [Sulfamethoxazole-Trimethoprim] Nausea And Vomiting    Vomiting  . Sulfa Antibiotics Nausea And Vomiting  . Zithromax [Azithromycin] Hives  . Cymbalta [Duloxetine Hcl] Other (See Comments)    fatigue    Family history: family history includes Asthma in her brother; Cancer in her maternal grandfather; Cancer (age of onset: 32) in her paternal grandmother; Deep vein thrombosis in her brother; Diabetes in her father; Emphysema in her mother; Heart Problems in her maternal grandmother; Heart attack in her maternal grandfather, paternal grandfather, and paternal  uncle; Heart disease in her father, paternal grandfather, and paternal uncle; Hyperlipidemia in her mother; Hypertension in her mother; Seizures in her mother; Stroke in her maternal grandmother and mother.  Prior to Admission medications   Medication Sig Start Date End Date Taking? Authorizing Provider  alendronate (FOSAMAX) 70 MG tablet Take 70 mg by mouth once a week. Take on Mondays 03/26/16  Yes Historical Provider, MD  atorvastatin (LIPITOR) 40 MG tablet Take 80 mg by mouth at bedtime.  05/02/15  Yes Historical Provider, MD  clonazePAM (KLONOPIN) 0.5 MG tablet Take 0.5 mg by mouth every evening.    Yes Historical Provider, MD  clopidogrel (PLAVIX) 75 MG tablet TAKE ONE TABLET BY MOUTH ONCE DAILY 02/24/16  Yes Drema Dallas, DO  cyclobenzaprine (FLEXERIL) 10 MG tablet Take 0.5-1 tablets (5-10 mg total) by mouth 3 (three) times daily as needed for muscle spasms. Patient taking differently: Take 5-10 mg by mouth 3 (three) times daily.  06/03/12  Yes Shirlean Kelly, MD  FLUoxetine (PROZAC) 20 MG capsule Take 60 mg by mouth daily.    Yes Historical Provider, MD  gabapentin (NEURONTIN) 300 MG capsule Take 300 mg by mouth 3 (three) times daily. 04/30/15  Yes Historical Provider, MD  lisinopril (PRINIVIL,ZESTRIL) 5 MG tablet Take 5 mg by mouth daily. 05/13/15  Yes Historical Provider, MD  metoprolol tartrate (LOPRESSOR) 25 MG tablet Take 25 mg by mouth 2 (two) times daily. 04/30/15  Yes Historical Provider, MD  omeprazole (PRILOSEC) 20 MG capsule Take 20 mg by mouth 2 (two) times daily as needed. For reflux   Yes Historical Provider, MD  aspirin 81 MG tablet Take 81 mg by mouth daily.    Historical Provider, MD       Physical Exam: BP 129/62   Pulse 86   Temp 98.4 F (36.9 C) (Oral)   Resp 20   Ht 5' (1.524 m)   Wt 89.8 kg (198 lb)   SpO2 92%   BMI 38.67 kg/m  General appearance: Well-developed, older adult female, alert and in mild distress from malaise.   Eyes: Anicteric, conjunctiva pink,  lids and lashes normal. PERRL.    ENT: No nasal deformity, discharge, epistaxis.  Hearing normal. OP moist without lesions.   Neck: No neck masses.  Trachea midline.  No thyromegaly/tenderness. Lymph: No cervical or supraclavicular lymphadenopathy. Skin: Warm and dry.  No jaundice.  No suspicious rashes or lesions. Cardiac: RRR, nl S1-S2, no murmurs appreciated.  Capillary refill is brisk.  JVP not elevated.  No LE edema.  Radial pulses 2+ and symmetric.  DP pulses diminished. Respiratory: Increased respiratory rate, no accessory muscle use.  Brief musical expiratory wheezes and rhonchi, coarse airway sounds, no rales. Abdomen: Abdomen soft.  No TTP. No ascites, distension, hepatosplenomegaly.   MSK: No deformities or effusions.  No cyanosis or clubbing. Neuro: Cranial nerves grossly normal.  Sensation intact to light touch. Speech is fluent.  Muscle strength normal.    Psych: Sensorium intact  and responding to questions, attention normal.  Behavior appropriate.  Affect normal.  Judgment and insight appear normal.     Labs on Admission:  I have personally reviewed following labs and imaging studies: CBC:  Recent Labs Lab 07/02/16 2130  WBC 9.8  HGB 14.6  HCT 43.5  MCV 86.5  PLT 197   Basic Metabolic Panel:  Recent Labs Lab 07/02/16 2130  NA 133*  K 3.7  CL 95*  CO2 28  GLUCOSE 113*  BUN 5*  CREATININE 0.81  CALCIUM 9.2   GFR: Estimated Creatinine Clearance: 70 mL/min (by C-G formula based on SCr of 0.81 mg/dL).  Liver Function Tests: No results for input(s): AST, ALT, ALKPHOS, BILITOT, PROT, ALBUMIN in the last 168 hours. No results for input(s): LIPASE, AMYLASE in the last 168 hours. No results for input(s): AMMONIA in the last 168 hours. Coagulation Profile: No results for input(s): INR, PROTIME in the last 168 hours. Cardiac Enzymes: No results for input(s): CKTOTAL, CKMB, CKMBINDEX, TROPONINI in the last 168 hours. BNP (last 3 results) No results for input(s):  PROBNP in the last 8760 hours. HbA1C: No results for input(s): HGBA1C in the last 72 hours. CBG: No results for input(s): GLUCAP in the last 168 hours. Lipid Profile: No results for input(s): CHOL, HDL, LDLCALC, TRIG, CHOLHDL, LDLDIRECT in the last 72 hours. Thyroid Function Tests: No results for input(s): TSH, T4TOTAL, FREET4, T3FREE, THYROIDAB in the last 72 hours. Anemia Panel: No results for input(s): VITAMINB12, FOLATE, FERRITIN, TIBC, IRON, RETICCTPCT in the last 72 hours. Sepsis Labs: Invalid input(s): PROCALCITONIN, LACTICIDVEN No results Scott for this or any previous visit (from the past 240 hour(s)).       Radiological Exams on Admission: Personally reviewed CXR shows coarse interstitial markings: Dg Chest 2 View  Result Date: 07/02/2016 CLINICAL DATA:  Cough and shortness of breath today. EXAM: CHEST  2 VIEW COMPARISON:  Radiographs 10/23/2013 FINDINGS: The cardiomediastinal contours are normal. There is bronchial thickening and interstitial coarsening that is new. Biapical pleuroparenchymal scarring. No confluent airspace disease. No pleural effusion or pneumothorax. No acute osseous abnormalities are seen. IMPRESSION: Bronchial thickening and interstitial coarsening, new, suggesting acute bronchitis. Pulmonary edema is felt less likely. Electronically Signed   By: Rubye OaksMelanie  Ehinger M.D.   On: 07/02/2016 22:16    EKG: Independently reviewed. Rate 96, QTc 427, no ischemic changes.    Assessment/Plan Principal Problem:   COPD exacerbation (HCC) Active Problems:   Peripheral vascular disease (HCC)   Essential hypertension   Acute respiratory failure with hypoxia (HCC)   Hyponatremia  1. Acute bronchitis vs influenza:  Suspect viral bronchitis, in setting of chronic smoking vs influenza.  Lungs sound more bronchitis than chronic lung disease/wheezing.  Myalgias, malaise syndrome is suspicious for flu. -Steroids -Bronchodilators -If flu positive, will start Tamiflu and  can stop steroids   2. HTN and TIA secondary prevention:  -Continue metoprolol, ACEi -Continue Plavix, aspirin, statin  3. Hyponatremia: Euvolemic to hypovolemic on exam. -Check uOsm/sOsm, urine sodium  4. Other medications:  -Continue fluoxetine, clonazepam -Continue PPI -Continue gabapentin, cyclobenzaprine           DVT prophylaxis: Lovenox  Code Status: FULL  Family Communication: Son at bedside  Disposition Plan: Anticipate observation and re-evaluate oxygenation tomorrow.  If still hypoxic tomorrow, may need inpatient stay for additional treatments. Consults called: None Admission status: OBS At the point of initial evaluation, it is my clinical opinion that admission for OBSERVATION is reasonable and necessary because the patient's presenting  complaints in the context of their chronic conditions represent sufficient risk of deterioration or significant morbidity to constitute reasonable grounds for close observation in the hospital setting, but that the patient may be medically stable for discharge from the hospital within 24 to 48 hours.    Medical decision making: Patient seen at 12:30 AM on 07/03/2016.  The patient was discussed with Dr. Jacqulyn BathLong.  What exists of the patient's chart was reviewed in depth and summarized above.  Clinical condition: stable from respiratory standpoint on supplemental O2 to be on medical floor.        Alberteen SamChristopher P Nashayla Telleria [facility] Pager [phone]

## 2016-07-03 NOTE — Care Management Obs Status (Signed)
MEDICARE OBSERVATION STATUS NOTIFICATION   Patient Details  Name: Norma Scott MRN: [id number] Date of Birth: 09/05/1950   Medicare Observation Status Notification Given:  Yes    Durenda GuthrieBrady, Odelia Graciano Naomi, RN 07/03/2016, 12:48 PM

## 2016-07-03 NOTE — Plan of Care (Signed)
Problem: Respiratory: Goal: Ability to maintain normal oxygenation or baseline will improve Outcome: Progressing With collaboration of nursing and respiratory therapy patient will be able to maintain normal oxygenation saturation without supplemental oxygen as that is the patient's baseline.

## 2016-07-04 DIAGNOSIS — I10 Essential (primary) hypertension: Secondary | ICD-10-CM

## 2016-07-04 DIAGNOSIS — J441 Chronic obstructive pulmonary disease with (acute) exacerbation: Secondary | ICD-10-CM

## 2016-07-04 DIAGNOSIS — J9601 Acute respiratory failure with hypoxia: Secondary | ICD-10-CM | POA: Diagnosis not present

## 2016-07-04 MED ORDER — ALBUTEROL SULFATE HFA 108 (90 BASE) MCG/ACT IN AERS: 1.0000 | INHALATION_SPRAY | Freq: Four times a day (QID) | RESPIRATORY_TRACT | 0 refills | Status: AC | PRN

## 2016-07-04 MED ORDER — OSELTAMIVIR PHOSPHATE 75 MG PO CAPS: 75.0000 mg | ORAL_CAPSULE | Freq: Two times a day (BID) | ORAL | 0 refills | Status: AC

## 2016-07-04 MED ORDER — ACETAMINOPHEN 325 MG PO TABS: 650.0000 mg | ORAL_TABLET | Freq: Four times a day (QID) | ORAL | 0 refills | Status: DC | PRN

## 2016-07-04 NOTE — Progress Notes (Signed)
SATURATION QUALIFICATIONS: (This note is used to comply with regulatory documentation for home oxygen)  Patient Saturations on Room Air at Rest = 92%  Patient Saturations on Room Air while Ambulating = 87%  Patient Saturations on 2 Liters of oxygen while Ambulating = 92%  Please briefly explain why patient needs home oxygen: Patient positive for influenza and has a history of asthma and COPD.

## 2016-07-04 NOTE — Discharge Summary (Signed)
Physician Discharge Summary  Norma Scott WUJ:[id number] DOB: 1950-08-29 DOA: 07/02/2016  PCP: Allean Found, MD  Admit date: 07/02/2016 Discharge date: 07/04/2016  Recommendations for Outpatient Follow-up:  Take tamiflu for 4 more days on discharge May use albuterol inhaler every 4-6 hours as needed for shortness of breath Order placed for home oxygen   Discharge Diagnoses:  Principal Problem:   COPD exacerbation (HCC) Active Problems:   Peripheral vascular disease (HCC)   Essential hypertension   Acute respiratory failure with hypoxia (HCC)   Hyponatremia    Discharge Condition: stable   Diet recommendation: as tolerated   History of present illness:  65 y.o.femalewith a past medical history significant for smoking, HTN and TIAwho presented to [facility] with congestion, sore throat, malaise for past 24 hours prior to this admission. She also had fevers, chills, generalized body aches. She reported her grandson was sick. In ED, she was afebrile, HR 99, normal oxygen saturation. Blood work showed sodium 133, normal creatinine. CXR showed bronchitis. She was given steroids, bronchodilators and doxycycline for possible exacerbation of COPD. She was subsequently found to have influenza A and was started on tamiflu.  Hospital Course:   Assessment and Plan:  Acute respiratory failure with hypoxia / Influenza A / Acute COPD exacerbation  - With ambulation oxygen saturation drops to high 80's, order placed for home oxygen - Influenza A positive - On Tamiflu, will continue tamiflu for 4 days on discharge - Continue bronchodilator, albuterol every 4-6 hours as needed for shortness of breath or wheezing   PVD - Continue aspirin daily, plavix daily   Depression - Continue Klonopin and Prozac  Dyslipidemia - Continue statin therapy   Essential hypertension - Continue lisinopril and metoprolol     Signed:  [name], MD  [facility] 07/04/2016, 10:33  AM  Pager #: [phone]  Time spent in minutes: less than 30 minutes    Discharge Exam: Vitals:   07/04/16 0435 07/04/16 0959  BP: (!) 112/44 (!) 113/55  Pulse: 70   Resp:    Temp: 99 F (37.2 C)    Vitals:   07/04/16 0435 07/04/16 0912 07/04/16 0914 07/04/16 0959  BP: (!) 112/44   (!) 113/55  Pulse: 70     Resp:      Temp: 99 F (37.2 C)     TempSrc: Oral     SpO2: 93% (!) 81% 92% 92%  Weight:      Height:        General: Pt is alert, follows commands appropriately, not in acute distress Cardiovascular: Regular rate and rhythm, S1/S2 + Respiratory: Clear to auscultation bilaterally, no wheezing Abdominal: Soft, non tender, non distended, bowel sounds +, no guarding Extremities: no edema, no cyanosis, pulses palpable bilaterally DP and PT Neuro: Grossly nonfocal  Discharge Instructions  Discharge Instructions    Call MD for:  persistant nausea and vomiting    Complete by:  As directed    Call MD for:  redness, tenderness, or signs of infection (pain, swelling, redness, odor or green/yellow discharge around incision site)    Complete by:  As directed    Call MD for:  severe uncontrolled pain    Complete by:  As directed    Diet - low sodium heart healthy    Complete by:  As directed    Discharge instructions    Complete by:  As directed    Take tamiflu for 4 more days on discharge May use albuterol inhaler every 4-6 hours as  needed for shortness of breath   Increase activity slowly    Complete by:  As directed        Medication List    TAKE these medications   acetaminophen 325 MG tablet Commonly known as:  TYLENOL Take 2 tablets (650 mg total) by mouth every 6 (six) hours as needed for mild pain (or Fever >/= 101).   albuterol 108 (90 Base) MCG/ACT inhaler Commonly known as:  PROVENTIL HFA Inhale 1 puff into the lungs every 6 (six) hours as needed for wheezing or shortness of breath.   alendronate 70 MG tablet Commonly known as:  FOSAMAX Take 70  mg by mouth once a week. Take on Mondays   aspirin 81 MG tablet Take 81 mg by mouth daily.   atorvastatin 40 MG tablet Commonly known as:  LIPITOR Take 80 mg by mouth at bedtime.   clonazePAM 0.5 MG tablet Commonly known as:  KLONOPIN Take 0.5 mg by mouth every evening.   clopidogrel 75 MG tablet Commonly known as:  PLAVIX TAKE ONE TABLET BY MOUTH ONCE DAILY   cyclobenzaprine 10 MG tablet Commonly known as:  FLEXERIL Take 0.5-1 tablets (5-10 mg total) by mouth 3 (three) times daily as needed for muscle spasms. What changed:  when to take this   FLUoxetine 20 MG capsule Commonly known as:  PROZAC Take 60 mg by mouth daily.   gabapentin 300 MG capsule Commonly known as:  NEURONTIN Take 300 mg by mouth 3 (three) times daily.   lisinopril 5 MG tablet Commonly known as:  PRINIVIL,ZESTRIL Take 5 mg by mouth daily.   metoprolol tartrate 25 MG tablet Commonly known as:  LOPRESSOR Take 25 mg by mouth 2 (two) times daily.   omeprazole 20 MG capsule Commonly known as:  PRILOSEC Take 20 mg by mouth 2 (two) times daily as needed. For reflux   oseltamivir 75 MG capsule Commonly known as:  TAMIFLU Take 1 capsule (75 mg total) by mouth 2 (two) times daily.            Durable Medical Equipment        Start     Ordered   07/04/16 1025  For home use only DME oxygen  Once    Question Answer Comment  Liters per Minute 2   Frequency Continuous (stationary and portable oxygen unit needed)   Oxygen delivery system Gas      07/04/16 1024     Follow-up Information    Allean FoundSMITH,CANDACE THIELE, MD. Schedule an appointment as soon as possible for a visit in 1 week(s).   Specialty:  Family Medicine Contact information: [phone] [address] [place] [id number] [phone]            The results of significant diagnostics from this hospitalization (including imaging, microbiology, ancillary and laboratory) are listed below for reference.    Significant  Diagnostic Studies: Dg Chest 2 View  Result Date: 07/02/2016 CLINICAL DATA:  Cough and shortness of breath today. EXAM: CHEST  2 VIEW COMPARISON:  Radiographs 10/23/2013 FINDINGS: The cardiomediastinal contours are normal. There is bronchial thickening and interstitial coarsening that is new. Biapical pleuroparenchymal scarring. No confluent airspace disease. No pleural effusion or pneumothorax. No acute osseous abnormalities are seen. IMPRESSION: Bronchial thickening and interstitial coarsening, new, suggesting acute bronchitis. Pulmonary edema is felt less likely. Electronically Signed   By: Rubye OaksMelanie  Ehinger M.D.   On: 07/02/2016 22:16    Microbiology: No results found for this or any previous visit (  from the past 240 hour(s)).   Labs: Basic Metabolic Panel:  Recent Labs Lab 07/02/16 2130 07/03/16 0502  NA 133* 136  K 3.7 3.8  CL 95* 97*  CO2 28 29  GLUCOSE 113* 174*  BUN 5* 5*  CREATININE 0.81 0.83  CALCIUM 9.2 8.8*   Liver Function Tests: No results for input(s): AST, ALT, ALKPHOS, BILITOT, PROT, ALBUMIN in the last 168 hours. No results for input(s): LIPASE, AMYLASE in the last 168 hours. No results for input(s): AMMONIA in the last 168 hours. CBC:  Recent Labs Lab 07/02/16 2130 07/03/16 0502  WBC 9.8 6.6  HGB 14.6 13.2  HCT 43.5 41.3  MCV 86.5 87.5  PLT 197 173   Cardiac Enzymes: No results for input(s): CKTOTAL, CKMB, CKMBINDEX, TROPONINI in the last 168 hours. BNP: BNP (last 3 results)  Recent Labs  07/02/16 2123  BNP 162.0*    ProBNP (last 3 results) No results for input(s): PROBNP in the last 8760 hours.  CBG: No results for input(s): GLUCAP in the last 168 hours.

## 2016-07-04 NOTE — Progress Notes (Signed)
Ambulated in hallway approximately 60 feet without oxygen. O2 saturation was 92% when we started and dropped to 87% by the end of the walk. O2 at 2L reapplied when patient back in room. Gildardo CrankerAnita Priddy, RN

## 2016-07-04 NOTE — Discharge Instructions (Addendum)

## 2016-07-04 NOTE — Progress Notes (Signed)
Patient ordered home oxygen therapy at 2L/min. Vance PeperSusan Brady, Case Management, arranged oxygen delivery with [facility], after speaking with patient. Gildardo CrankerAnita Priddy, RN

## 2016-07-04 NOTE — Care Management Note (Signed)
Case Management Note  Patient Details  Name: Norma Scott MRN: [id number] Date of Birth: 06/23/1951  Subjective/Objective:   65 yr old female admitted with COPD exacerbation.                  Action/Plan: Case manager spoke with patient concerning need for home oxygen. [facility]. Patient will need oxygen at 2L via nasal cannula.   Expected Discharge Date:   07/04/16               Expected Discharge Plan:  Home/Self Care  In-House Referral:     Discharge planning Services  CM Consult  Post Acute Care Choice:  Durable Medical Equipment Choice offered to:  Patient  DME Arranged:  Oxygen DME Agency:  [facility]  HH Arranged:  NA HH Agency:  NA  Status of Service:  Completed, signed off  If discussed at Long Length of Stay Meetings, dates discussed:    Additional Comments:  Durenda GuthrieBrady, Britian Jentz Naomi, RN 07/04/2016, 1:54 PM

## 2016-07-14 DIAGNOSIS — R0902 Hypoxemia: Secondary | ICD-10-CM | POA: Diagnosis not present

## 2016-07-14 DIAGNOSIS — J449 Chronic obstructive pulmonary disease, unspecified: Secondary | ICD-10-CM | POA: Diagnosis not present

## 2016-08-03 DIAGNOSIS — J441 Chronic obstructive pulmonary disease with (acute) exacerbation: Secondary | ICD-10-CM | POA: Diagnosis not present

## 2016-08-03 DIAGNOSIS — F411 Generalized anxiety disorder: Secondary | ICD-10-CM | POA: Diagnosis not present

## 2016-08-03 DIAGNOSIS — I739 Peripheral vascular disease, unspecified: Secondary | ICD-10-CM | POA: Diagnosis not present

## 2016-08-03 DIAGNOSIS — F17210 Nicotine dependence, cigarettes, uncomplicated: Secondary | ICD-10-CM | POA: Diagnosis not present

## 2016-08-03 DIAGNOSIS — I10 Essential (primary) hypertension: Secondary | ICD-10-CM | POA: Diagnosis not present

## 2016-08-07 DIAGNOSIS — F411 Generalized anxiety disorder: Secondary | ICD-10-CM | POA: Diagnosis not present

## 2016-08-07 DIAGNOSIS — I739 Peripheral vascular disease, unspecified: Secondary | ICD-10-CM | POA: Diagnosis not present

## 2016-08-07 DIAGNOSIS — J441 Chronic obstructive pulmonary disease with (acute) exacerbation: Secondary | ICD-10-CM | POA: Diagnosis not present

## 2016-08-07 DIAGNOSIS — I10 Essential (primary) hypertension: Secondary | ICD-10-CM | POA: Diagnosis not present

## 2016-08-07 DIAGNOSIS — F17210 Nicotine dependence, cigarettes, uncomplicated: Secondary | ICD-10-CM | POA: Diagnosis not present

## 2016-08-10 DIAGNOSIS — F17210 Nicotine dependence, cigarettes, uncomplicated: Secondary | ICD-10-CM | POA: Diagnosis not present

## 2016-08-10 DIAGNOSIS — J441 Chronic obstructive pulmonary disease with (acute) exacerbation: Secondary | ICD-10-CM | POA: Diagnosis not present

## 2016-08-10 DIAGNOSIS — F411 Generalized anxiety disorder: Secondary | ICD-10-CM | POA: Diagnosis not present

## 2016-08-10 DIAGNOSIS — I739 Peripheral vascular disease, unspecified: Secondary | ICD-10-CM | POA: Diagnosis not present

## 2016-08-10 DIAGNOSIS — I10 Essential (primary) hypertension: Secondary | ICD-10-CM | POA: Diagnosis not present

## 2016-08-20 DIAGNOSIS — I10 Essential (primary) hypertension: Secondary | ICD-10-CM | POA: Diagnosis not present

## 2016-08-20 DIAGNOSIS — F411 Generalized anxiety disorder: Secondary | ICD-10-CM | POA: Diagnosis not present

## 2016-08-20 DIAGNOSIS — I739 Peripheral vascular disease, unspecified: Secondary | ICD-10-CM | POA: Diagnosis not present

## 2016-08-20 DIAGNOSIS — J441 Chronic obstructive pulmonary disease with (acute) exacerbation: Secondary | ICD-10-CM | POA: Diagnosis not present

## 2016-08-20 DIAGNOSIS — F17210 Nicotine dependence, cigarettes, uncomplicated: Secondary | ICD-10-CM | POA: Diagnosis not present

## 2016-08-28 DIAGNOSIS — I739 Peripheral vascular disease, unspecified: Secondary | ICD-10-CM | POA: Diagnosis not present

## 2016-08-28 DIAGNOSIS — F17210 Nicotine dependence, cigarettes, uncomplicated: Secondary | ICD-10-CM | POA: Diagnosis not present

## 2016-08-28 DIAGNOSIS — I10 Essential (primary) hypertension: Secondary | ICD-10-CM | POA: Diagnosis not present

## 2016-08-28 DIAGNOSIS — J441 Chronic obstructive pulmonary disease with (acute) exacerbation: Secondary | ICD-10-CM | POA: Diagnosis not present

## 2016-08-28 DIAGNOSIS — F411 Generalized anxiety disorder: Secondary | ICD-10-CM | POA: Diagnosis not present

## 2016-11-06 ENCOUNTER — Ambulatory Visit: Payer: Medicare Other | Admitting: Neurology

## 2016-11-06 DIAGNOSIS — Z029 Encounter for administrative examinations, unspecified: Secondary | ICD-10-CM

## 2016-11-11 ENCOUNTER — Encounter: Payer: Self-pay | Admitting: Neurology

## 2016-12-09 DIAGNOSIS — K219 Gastro-esophageal reflux disease without esophagitis: Secondary | ICD-10-CM | POA: Diagnosis not present

## 2016-12-09 DIAGNOSIS — F411 Generalized anxiety disorder: Secondary | ICD-10-CM | POA: Diagnosis not present

## 2016-12-09 DIAGNOSIS — J449 Chronic obstructive pulmonary disease, unspecified: Secondary | ICD-10-CM | POA: Diagnosis not present

## 2016-12-09 DIAGNOSIS — E7800 Pure hypercholesterolemia, unspecified: Secondary | ICD-10-CM | POA: Diagnosis not present

## 2016-12-09 DIAGNOSIS — M797 Fibromyalgia: Secondary | ICD-10-CM | POA: Diagnosis not present

## 2016-12-09 DIAGNOSIS — I10 Essential (primary) hypertension: Secondary | ICD-10-CM | POA: Diagnosis not present

## 2017-03-11 ENCOUNTER — Other Ambulatory Visit: Payer: Self-pay | Admitting: Family Medicine

## 2017-03-11 ENCOUNTER — Other Ambulatory Visit (HOSPITAL_COMMUNITY)
Admission: RE | Admit: 2017-03-11 | Discharge: 2017-03-11 | Disposition: A | Discharge status: Home / Self Care | Payer: Medicare Other | Source: Ambulatory Visit | Attending: Family Medicine | Admitting: Family Medicine

## 2017-03-11 DIAGNOSIS — I739 Peripheral vascular disease, unspecified: Secondary | ICD-10-CM | POA: Diagnosis not present

## 2017-03-11 DIAGNOSIS — M797 Fibromyalgia: Secondary | ICD-10-CM | POA: Diagnosis not present

## 2017-03-11 DIAGNOSIS — J449 Chronic obstructive pulmonary disease, unspecified: Secondary | ICD-10-CM | POA: Diagnosis not present

## 2017-03-11 DIAGNOSIS — K219 Gastro-esophageal reflux disease without esophagitis: Secondary | ICD-10-CM | POA: Diagnosis not present

## 2017-03-11 DIAGNOSIS — Z124 Encounter for screening for malignant neoplasm of cervix: Secondary | ICD-10-CM | POA: Insufficient documentation

## 2017-03-11 DIAGNOSIS — F321 Major depressive disorder, single episode, moderate: Secondary | ICD-10-CM | POA: Diagnosis not present

## 2017-03-11 DIAGNOSIS — E7800 Pure hypercholesterolemia, unspecified: Secondary | ICD-10-CM | POA: Diagnosis not present

## 2017-03-11 DIAGNOSIS — F411 Generalized anxiety disorder: Secondary | ICD-10-CM | POA: Diagnosis not present

## 2017-03-11 DIAGNOSIS — Z0001 Encounter for general adult medical examination with abnormal findings: Secondary | ICD-10-CM | POA: Diagnosis not present

## 2017-03-11 DIAGNOSIS — M5416 Radiculopathy, lumbar region: Secondary | ICD-10-CM | POA: Diagnosis not present

## 2017-03-11 DIAGNOSIS — I10 Essential (primary) hypertension: Secondary | ICD-10-CM | POA: Diagnosis not present

## 2017-03-11 DIAGNOSIS — Z1159 Encounter for screening for other viral diseases: Secondary | ICD-10-CM | POA: Diagnosis not present

## 2017-03-11 DIAGNOSIS — Z1389 Encounter for screening for other disorder: Secondary | ICD-10-CM | POA: Diagnosis not present

## 2017-03-12 LAB — CYTOLOGY - PAP: Diagnosis: NEGATIVE

## 2017-04-30 ENCOUNTER — Other Ambulatory Visit: Payer: Self-pay | Admitting: Neurology

## 2017-04-30 NOTE — Telephone Encounter (Signed)
LM fo rPt to rtrn my call. Has not been seen since 10/2015 and there is not a return appt. Checking if she is still on the Plavix or if someone else is presribing

## 2017-06-01 ENCOUNTER — Other Ambulatory Visit: Payer: Self-pay | Admitting: Neurology

## 2017-06-14 ENCOUNTER — Ambulatory Visit: Payer: Medicare Other | Admitting: Family

## 2017-06-14 ENCOUNTER — Encounter (HOSPITAL_COMMUNITY): Payer: Medicare Other

## 2017-07-17 ENCOUNTER — Emergency Department (HOSPITAL_COMMUNITY): Payer: Medicare Other

## 2017-07-17 ENCOUNTER — Emergency Department (HOSPITAL_COMMUNITY)
Admission: EM | Admit: 2017-07-17 | Discharge: 2017-07-17 | Disposition: A | Discharge status: Home / Self Care | Payer: Medicare Other | Attending: Emergency Medicine | Admitting: Emergency Medicine

## 2017-07-17 ENCOUNTER — Encounter (HOSPITAL_COMMUNITY): Payer: Self-pay | Admitting: Emergency Medicine

## 2017-07-17 DIAGNOSIS — R202 Paresthesia of skin: Secondary | ICD-10-CM | POA: Diagnosis not present

## 2017-07-17 DIAGNOSIS — R42 Dizziness and giddiness: Secondary | ICD-10-CM | POA: Diagnosis not present

## 2017-07-17 DIAGNOSIS — Z79899 Other long term (current) drug therapy: Secondary | ICD-10-CM | POA: Insufficient documentation

## 2017-07-17 DIAGNOSIS — I10 Essential (primary) hypertension: Secondary | ICD-10-CM | POA: Diagnosis not present

## 2017-07-17 DIAGNOSIS — J45909 Unspecified asthma, uncomplicated: Secondary | ICD-10-CM | POA: Diagnosis not present

## 2017-07-17 DIAGNOSIS — J449 Chronic obstructive pulmonary disease, unspecified: Secondary | ICD-10-CM | POA: Insufficient documentation

## 2017-07-17 DIAGNOSIS — H539 Unspecified visual disturbance: Secondary | ICD-10-CM | POA: Diagnosis not present

## 2017-07-17 DIAGNOSIS — Z8673 Personal history of transient ischemic attack (TIA), and cerebral infarction without residual deficits: Secondary | ICD-10-CM | POA: Insufficient documentation

## 2017-07-17 DIAGNOSIS — H538 Other visual disturbances: Secondary | ICD-10-CM | POA: Diagnosis not present

## 2017-07-17 DIAGNOSIS — I2510 Atherosclerotic heart disease of native coronary artery without angina pectoris: Secondary | ICD-10-CM | POA: Insufficient documentation

## 2017-07-17 DIAGNOSIS — Z7902 Long term (current) use of antithrombotics/antiplatelets: Secondary | ICD-10-CM | POA: Insufficient documentation

## 2017-07-17 DIAGNOSIS — I6789 Other cerebrovascular disease: Secondary | ICD-10-CM | POA: Diagnosis not present

## 2017-07-17 DIAGNOSIS — F17210 Nicotine dependence, cigarettes, uncomplicated: Secondary | ICD-10-CM | POA: Insufficient documentation

## 2017-07-17 LAB — RAPID URINE DRUG SCREEN, HOSP PERFORMED
Amphetamines: NOT DETECTED
BARBITURATES: NOT DETECTED
BENZODIAZEPINES: NOT DETECTED
Cocaine: NOT DETECTED
Opiates: NOT DETECTED
Tetrahydrocannabinol: NOT DETECTED

## 2017-07-17 LAB — CBC
HCT: 45 % (ref 36.0–46.0)
Hemoglobin: 14.4 g/dL (ref 12.0–15.0)
MCH: 28.7 pg (ref 26.0–34.0)
MCHC: 32 g/dL (ref 30.0–36.0)
MCV: 89.8 fL (ref 78.0–100.0)
PLATELETS: 200 10*3/uL (ref 150–400)
RBC: 5.01 MIL/uL (ref 3.87–5.11)
RDW: 13.1 % (ref 11.5–15.5)
WBC: 9.5 10*3/uL (ref 4.0–10.5)

## 2017-07-17 LAB — URINALYSIS, ROUTINE W REFLEX MICROSCOPIC
BILIRUBIN URINE: NEGATIVE
Glucose, UA: NEGATIVE mg/dL
HGB URINE DIPSTICK: NEGATIVE
KETONES UR: NEGATIVE mg/dL
Leukocytes, UA: NEGATIVE
NITRITE: NEGATIVE
PROTEIN: NEGATIVE mg/dL
SPECIFIC GRAVITY, URINE: 1.008 (ref 1.005–1.030)
pH: 6 (ref 5.0–8.0)

## 2017-07-17 LAB — COMPREHENSIVE METABOLIC PANEL
ALBUMIN: 3.4 g/dL — AB (ref 3.5–5.0)
ALT: 23 U/L (ref 14–54)
ANION GAP: 11 (ref 5–15)
AST: 29 U/L (ref 15–41)
Alkaline Phosphatase: 116 U/L (ref 38–126)
BUN: 7 mg/dL (ref 6–20)
CALCIUM: 8.9 mg/dL (ref 8.9–10.3)
CHLORIDE: 100 mmol/L — AB (ref 101–111)
CO2: 28 mmol/L (ref 22–32)
Creatinine, Ser: 0.81 mg/dL (ref 0.44–1.00)
GFR calc non Af Amer: >60 mL/min (ref >60)
GLUCOSE: 96 mg/dL (ref 65–99)
POTASSIUM: 3.7 mmol/L (ref 3.5–5.1)
SODIUM: 139 mmol/L (ref 135–145)
Total Bilirubin: 0.6 mg/dL (ref 0.3–1.2)
Total Protein: 7 g/dL (ref 6.5–8.1)

## 2017-07-17 LAB — PROTIME-INR
INR: 0.97
Prothrombin Time: 12.7 seconds (ref 11.4–15.2)

## 2017-07-17 MED ORDER — ASPIRIN 325 MG PO TABS
325.0000 mg | ORAL_TABLET | Freq: Once | ORAL | Status: AC
  Administered 2017-07-17: 325 mg via ORAL
  Filled 2017-07-17: qty 1

## 2017-07-17 NOTE — ED Triage Notes (Signed)
Pt to ER for evaluation of sudden onset of blurred vision while driving with bilateral arm numbness, pulled off the road and activated EMS. States hx of TIA. On EMS arrival all of patient symptoms had subsided. Patient in NAD on arrival. No neuro deficits at this time. VSS.

## 2017-07-17 NOTE — ED Provider Notes (Signed)
[facility] EMERGENCY DEPARTMENT Provider Note   CSN: [id number] Arrival date & time: 07/17/17  1018     History   Chief Complaint Chief Complaint  Patient presents with  . Blurred Vision    HPI Norma Scott is a 66 y.o. female with history of carotid artery disease status post right CVA, hyperlipidemia, hypertension, tobacco abuse, TIA on Plavix presents for evaluation of double vision in both eyes, diffuse tingling from the waistline up including face and bilateral arms that began at 0910 last approximately 5 minutes. She was with her family who reportedly had her smile and hold her arms out, they deny any obvious facial drooping, slurred speech. Patient currently is asymptomatic. Associated symptoms include feeling lightheaded. States she had a TIA close to 3 years ago and was admitted to the hospital for workup. Since then states she has had approximately 2 more mini stroke like episodes which she has not been evaluated for. She is followed by [facility] Neurology, Dr. Adriana Mccallum in vascular surgery.  She denies recent fevers, headaches, chest pain, shortness of breath, palpitations, abdominal pain, nausea, vomiting, numbness or weakness unilaterally. 1/3 ppd of cigarettes.   HPI  Past Medical History:  Diagnosis Date  . Allergy    allergic rhinitis  . Arteriosclerotic coronary artery disease 05/21/2015  . Asthma   . CAD (coronary artery disease)    patient says no problems   dr Williemae Natter smith pcp  . Carotid artery occlusion   . Cervical disc displacement    x2  . COPD (chronic obstructive pulmonary disease) (HCC)   . Esophageal reflux   . Essential hypertension 05/21/2015  . Family history of adverse reaction to anesthesia    mother hard to wake and affected b/p  . Fibromyalgia   . Gall stones   . Generalized anxiety disorder   . Hyperlipidemia   . Lumbar disc herniation    L5-S1  . Mini stroke (HCC)   . TIA (transient ischemic attack) 05/21/2015     Patient Active Problem List   Diagnosis Date Noted  . COPD exacerbation (HCC) 07/03/2016  . Acute respiratory failure with hypoxia (HCC) 07/03/2016  . Hyponatremia 07/03/2016  . TIA (transient ischemic attack) 05/21/2015  . Essential hypertension 05/21/2015  . Hyperlipidemia 05/21/2015  . Arteriosclerotic coronary artery disease 05/21/2015  . Aftercare following surgery of the circulatory system, NEC 01/22/2014  . Pain in limb-Bilateral shoulder Right > left 05/15/2013  . Occlusion and stenosis of carotid artery without mention of cerebral infarction 05/16/2012  . Peripheral vascular disease (HCC) 05/16/2012  . PAD (peripheral artery disease) (HCC) 02/08/2012    Past Surgical History:  Procedure Laterality Date  . BACK SURGERY  06/01/2012   lumbar  . CAROTID ENDARTERECTOMY  02/01/2009   Right  . CERVICAL FUSION     1995, 1996  . CHOLECYSTECTOMY    . CHOLECYSTECTOMY    . ECTOPIC PREGNANCY SURGERY  93  . ERCP N/A 11/20/2015   Procedure: ENDOSCOPIC RETROGRADE CHOLANGIOPANCREATOGRAPHY (ERCP);  Surgeon: Willis Modena, MD;  Location: [facility];  Service: Endoscopy;  Laterality: N/A;  . EUS N/A 11/20/2015   Procedure: UPPER ENDOSCOPIC ULTRASOUND (EUS) RADIAL;  Surgeon: Willis Modena, MD;  Location: [facility];  Service: Endoscopy;  Laterality: N/A;  . TUBAL LIGATION      OB History    No data available       Home Medications    Prior to Admission medications   Medication Sig Start Date End Date Taking? Authorizing Provider  alendronate (FOSAMAX) 70 MG tablet Take 70 mg by mouth once a week. Take on Mondays 03/26/16  Yes [provider]  atorvastatin (LIPITOR) 40 MG tablet Take 40 mg by mouth at bedtime.  05/02/15  Yes [provider]  clonazePAM (KLONOPIN) 0.5 MG tablet Take 1 mg by mouth every evening.    Yes [provider]  clopidogrel (PLAVIX) 75 MG tablet TAKE ONE TABLET BY MOUTH ONCE DAILY 02/24/16  Yes Everlena CooperJaffe, Adam R, DO   cyclobenzaprine (FLEXERIL) 10 MG tablet Take 0.5-1 tablets (5-10 mg total) by mouth 3 (three) times daily as needed for muscle spasms. Patient taking differently: Take 5-10 mg by mouth 3 (three) times daily.  06/03/12  Yes Shirlean KellyNudelman, Robert, MD  FLUoxetine (PROZAC) 20 MG capsule Take 60 mg by mouth daily.    Yes [provider]  gabapentin (NEURONTIN) 300 MG capsule Take 300-600 mg by mouth 3 (three) times daily. 300 mg twice daily. 600 mg at night 04/30/15  Yes [provider]  lisinopril (PRINIVIL,ZESTRIL) 5 MG tablet Take 5 mg by mouth daily. 05/13/15  Yes [provider]  metoprolol tartrate (LOPRESSOR) 25 MG tablet Take 25 mg by mouth 2 (two) times daily. 04/30/15  Yes [provider]  omeprazole (PRILOSEC) 20 MG capsule Take 20 mg by mouth 2 (two) times daily as needed. For reflux   Yes [provider]  acetaminophen (TYLENOL) 325 MG tablet Take 2 tablets (650 mg total) by mouth every 6 (six) hours as needed for mild pain (or Fever >/= 101). 07/04/16   Alison Murrayevine, Alma M, MD  albuterol (PROVENTIL HFA) 108 (90 Base) MCG/ACT inhaler Inhale 1 puff into the lungs every 6 (six) hours as needed for wheezing or shortness of breath. 07/04/16   Alison Murrayevine, Alma M, MD    Family History Family History  Problem Relation Age of Onset  . Stroke Mother   . Hypertension Mother   . Hyperlipidemia Mother   . Seizures Mother        Grand mal seizures  . Emphysema Mother   . Diabetes Father   . Heart disease Father        Heart Disease before age 65  . Stroke Maternal Grandmother   . Heart Problems Maternal Grandmother   . Cancer Maternal Grandfather        stomach cancer  . Heart attack Maternal Grandfather   . Heart disease Paternal Grandfather   . Heart attack Paternal Grandfather   . Deep vein thrombosis Brother   . Asthma Brother   . Cancer Paternal Grandmother [phone]  . Heart disease Paternal Uncle   . Heart attack Paternal Uncle     Social History Social  History   Tobacco Use  . Smoking status: Current Some Day Smoker    Packs/day: 0.50    Years: 45.00    Pack years: 22.50    Types: Cigarettes  . Smokeless tobacco: Never Used  . Tobacco comment: states she only smokes about 1/2 pack  Substance Use Topics  . Alcohol use: Yes    Alcohol/week: 0.0 oz    Comment: 2 times a year  . Drug use: No     Allergies   Wellbutrin [bupropion]; Septra [sulfamethoxazole-trimethoprim]; Sulfa antibiotics; Zithromax [azithromycin]; and Cymbalta [duloxetine hcl]   Review of Systems Review of Systems  Eyes: Positive for visual disturbance.  Neurological: Positive for light-headedness.       +paresthesias   All other systems reviewed and are negative.    Physical Exam  Updated Vital Signs BP (!) 149/84   Pulse 60   Temp 98.1 F (36.7 C)   Resp 18   SpO2 95%   Physical Exam  Constitutional: She is oriented to person, place, and time. She appears well-developed and well-nourished. No distress.  NAD.  HENT:  Head: Normocephalic and atraumatic.  Right Ear: External ear normal.  Left Ear: External ear normal.  Nose: Nose normal.  Eyes: Conjunctivae and EOM are normal. No scleral icterus.  Neck: Normal range of motion. Neck supple.  Cardiovascular: Normal rate, regular rhythm and normal heart sounds.  No murmur heard. 2+ DP and radial pulses bilaterally No LE edema or calf tenderness   Pulmonary/Chest: Effort normal and breath sounds normal. She has no wheezes.  Abdominal: Soft. There is no tenderness.  Musculoskeletal: Normal range of motion. She exhibits no deformity.  Neurological: She is alert and oriented to person, place, and time.  No dysarthria. No nystagmus. Strength 5/5 with hand grip and ankle flexion/extension.   Sensation to light touch intact in hands and feet. Steady gait. Negative Romberg. Negative FTN test.  CN I and VIII not tested. CN II-XII intact bilaterally.   Skin: Skin is warm and dry. Capillary refill takes  less than 2 seconds.  Right CEA surgical scar, well healed  Psychiatric: She has a normal mood and affect. Her behavior is normal. Judgment and thought content normal.  Nursing note and vitals reviewed.    ED Treatments / Results  Labs (all labs ordered are listed, but only abnormal results are displayed) Labs Reviewed  COMPREHENSIVE METABOLIC PANEL - Abnormal; Notable for the following components:      Result Value   Chloride 100 (*)    Albumin 3.4 (*)    All other components within normal limits  CBC  PROTIME-INR  URINALYSIS, ROUTINE W REFLEX MICROSCOPIC  RAPID URINE DRUG SCREEN, HOSP PERFORMED  CBG MONITORING, ED    EKG  EKG Interpretation None       Radiology Ct Head Wo Contrast  Result Date: 07/17/2017 CLINICAL DATA:  Sudden onset blurred vision. Bilateral arm numbness surround 930. EXAM: CT HEAD WITHOUT CONTRAST TECHNIQUE: Contiguous axial images were obtained from the base of the skull through the vertex without intravenous contrast. COMPARISON:  MR brain 06/05/2015 FINDINGS: Brain: No evidence of acute infarction, hemorrhage, extra-axial collection, ventriculomegaly, or mass effect. Generalized cerebral atrophy. Periventricular white matter low attenuation likely secondary to microangiopathy. Vascular: Cerebrovascular atherosclerotic calcifications are noted. Skull: Negative for fracture or focal lesion. Sinuses/Orbits: Visualized portions of the orbits are unremarkable. Visualized portions of the paranasal sinuses and mastoid air cells are unremarkable. Other: None. IMPRESSION: No acute intracranial pathology. Electronically Signed   By: Elige KoHetal  Patel   On: 07/17/2017 11:42    Procedures Procedures (including critical care time)  Medications Ordered in ED Medications  aspirin tablet 325 mg (325 mg Oral Given 07/17/17 1200)     Initial Impression / Assessment and Plan / ED Course  I have reviewed the triage vital signs and the nursing notes.  Pertinent labs &  imaging results that were available during my care of the patient were reviewed by me and considered in my medical decision making (see chart for details).  Clinical Course as of Jul 17 1352  Sat Jul 17, 2017  1335 Spoke to Dr Otelia LimesLindzen   [CG]    Clinical Course User Index [CG] Liberty HandyGibbons, Abeer Iversen J, [place]-C   66 year old female presents with paresthesias, double vision and lightheadedness onset 0910 and lasting  approximately 5 minutes. All symptoms have resolved. No objective neurologic deficits on exam today. She has high risk for TIA/CVA with history of previous TIA, hypertension, hyperlipidemia, tobacco abuse, obesity. She is on Plavix. Followed by [facility] neurology. Will initiate TIA workup and reassess.   Final Clinical Impressions(s) / ED Diagnoses   Lab work and CT head unremarkable. Patient has remained asymptomatic without neuro deficits on repeat exam. Given risk factors and double vision will consult neurology for recommendations.Marland Kitchen  Spoke to neurology who recommends discharge with close cardiology and neurology follow-up. Given bilateral symptoms, low suspicion for TIA/CVA. Also considering orthostatic hypotension, blood pressure dropped by 20 when sitting to standing, heart rate unchanged and patient was not symptomatic. Discussed workup, neuro recommendations and plan to discharge with patient and family members were agreeable with ED treatment. Patient shared with supervising physician. Final diagnoses:  Visual disturbance  Light headedness    ED Discharge Orders    None       Jerrell Mylar 07/17/17 1353    Benjiman Core, MD 07/17/17 1539

## 2017-07-17 NOTE — Discharge Instructions (Signed)
ED workup today was reassuring. Neurology recommends follow-up with cardiology and neurology. Your blood pressure dropped when you went from sitting to standing, this can sometimes cause visual changes, lightheadedness and could explain your symptoms today. Follow-up with your neurologist as soon as possible for further discussion of your symptoms today. We recommend follow-up with cardiology as well for evaluation of your heart rhythm and blood pressure.

## 2017-07-22 IMAGING — MR MR MRA NECK WO/W CM
12 of 18 series · 25 of 48 positions shown · IV contrast (multihance)
Comparison: None available.

CLINICAL DATA: Carotid artery stenosis. TIA. Cervical
endarterectomy 5005.

EXAM:
MRI HEAD WITHOUT AND WITH CONTRAST
MRA NECK WITHOUT AND WITH CONTRAST
TECHNIQUE: Multiplanar, multiecho pulse sequences of the brain and surrounding
structures were obtained without and with intravenous contrast.
Angiographic images of the neck were obtained using MRA technique
with and without intravenous contrast. Carotid stenosis measurements
(when applicable) are obtained utilizing NASCET criteria, using the
distal internal carotid diameter as the denominator.
CONTRAST:  15mL MULTIHANCE GADOBENATE DIMEGLUMINE 529 MG/ML IV SOLN

[Series 3: T1 · sagittal · 5.0mm · 0.47mm/px · 1 of 23 slices shown]
[im 1/23]
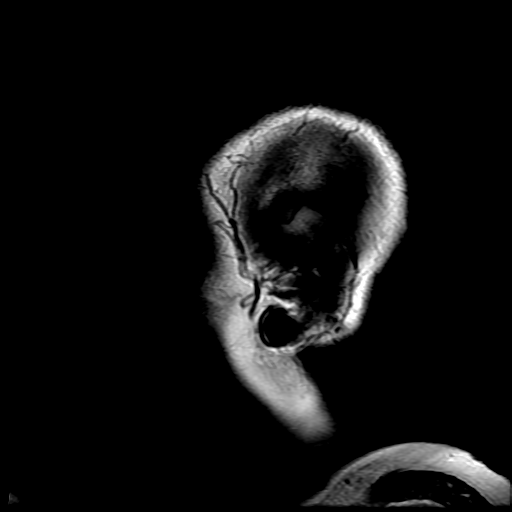

[Series 4: DWI · axial · 3.0mm · 1.09mm/px · z∈[-33,+104]mm · 3 of 94 slices shown (1 of 4)]
[im 1/94]
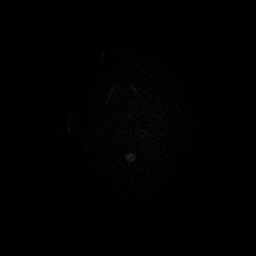
[im 47/94]
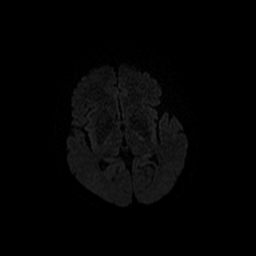
[im 94/94]
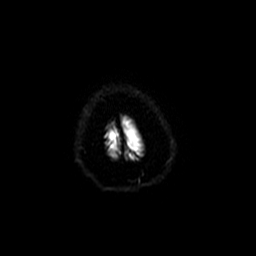

[Series 5: DWI · coronal · 5.0mm · 1.09mm/px · 3 of 66 slices shown (2 of 4)]
[im 1/66]
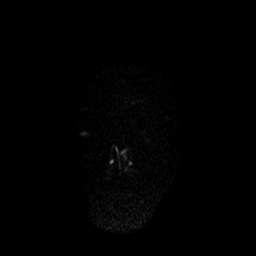
[im 33/66]
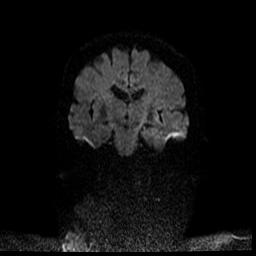
[im 66/66]
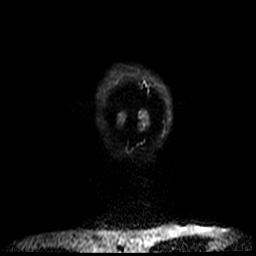

[Series 6: T2 · axial · 5.0mm · 0.43mm/px · 1 of 24 slices shown (1 of 2)]
[im 1/24]
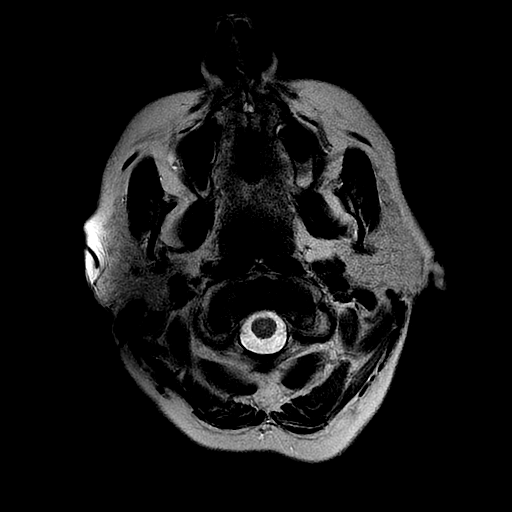

[Series 7: FLAIR · axial · 5.0mm · 0.43mm/px · 1 of 24 slices shown]
[im 1/24]
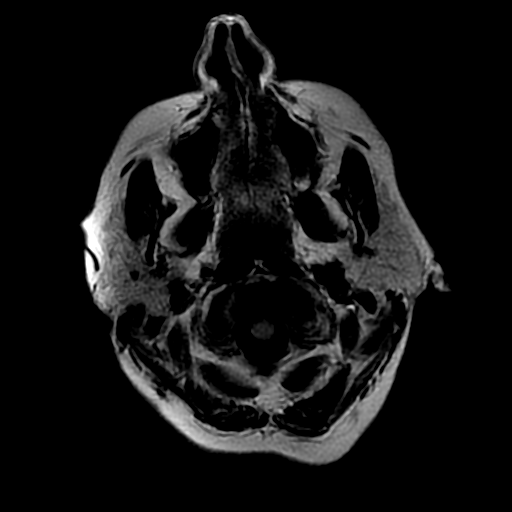

[Series 8: ax mpgr · axial · 5.0mm · 0.43mm/px · 1 of 24 slices shown]
[im 1/24]
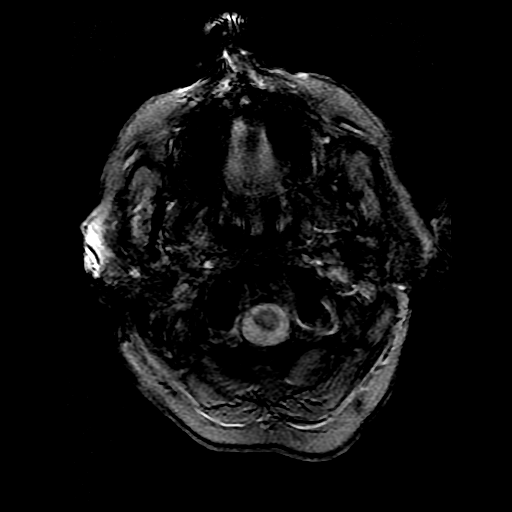

[Series 10: T2 · coronal · 5.0mm · 0.43mm/px · 1 of 28 slices shown (2 of 2)]
[im 1/28]
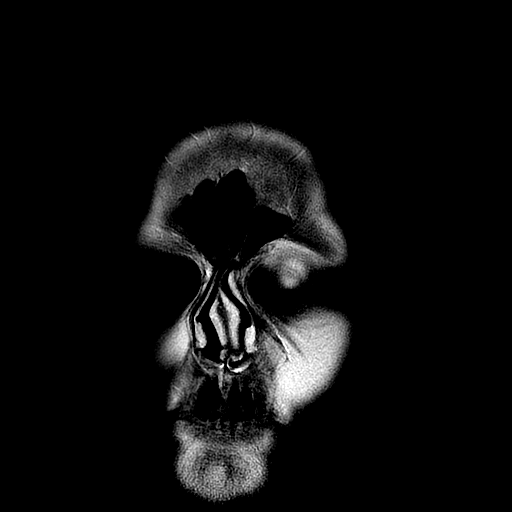

[Series 16: ax (id) · axial · 2.8mm · 0.47mm/px · z∈[-200,-52]mm · 5 of 108 slices shown]
[im 1/108]
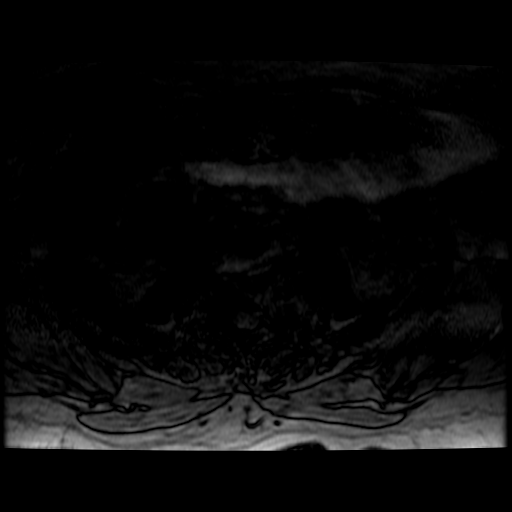
[im 27/108]
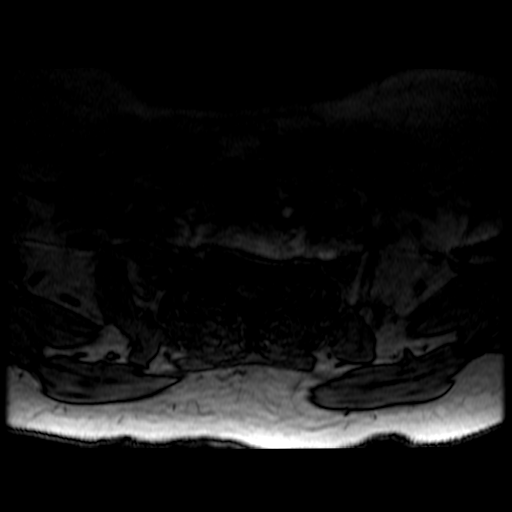
[im 54/108]
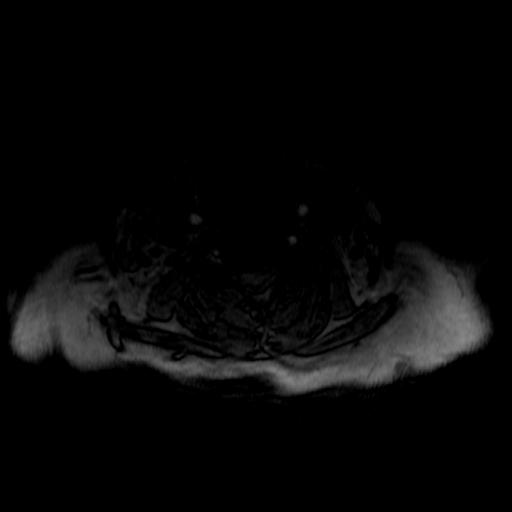
[im 81/108]
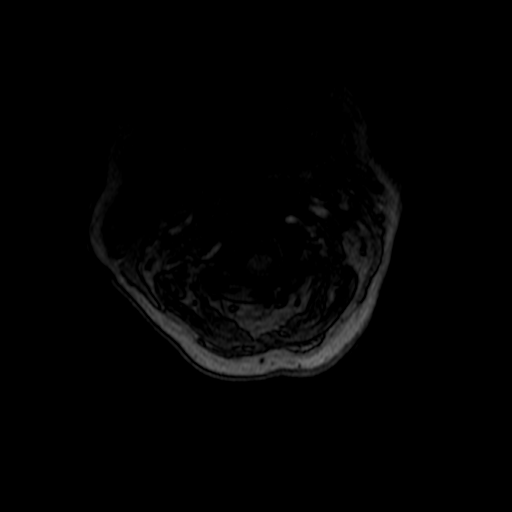
[im 108/108]
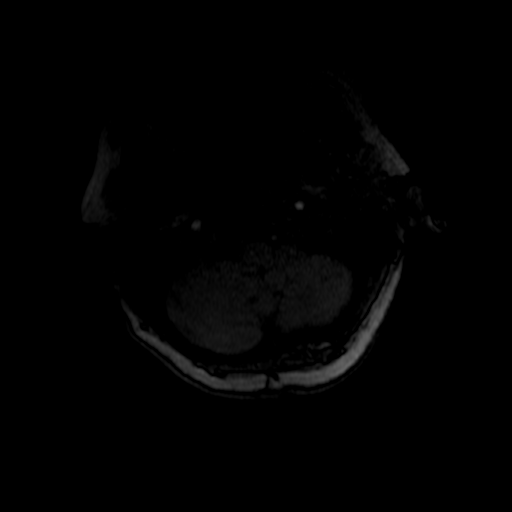

[Series 19: T1 post-contrast · coronal · 5.0mm · 0.43mm/px · 1 of 25 slices shown]
[im 1/25]
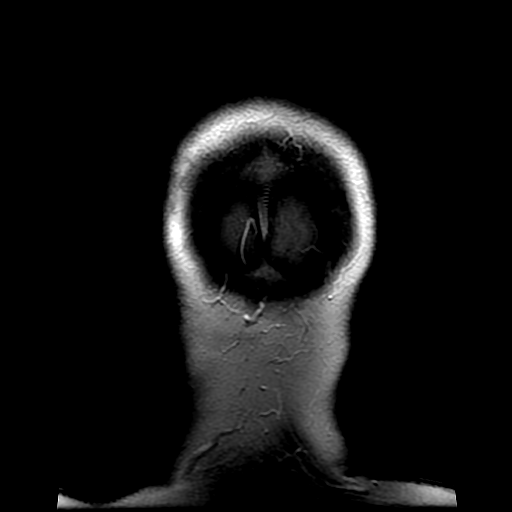

[Series 400: DWI · axial · 3.0mm · 1.09mm/px · z∈[-33,+104]mm · 2 of 47 slices shown (3 of 4)]
[im 1/47]
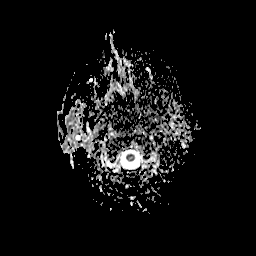
[im 47/47]
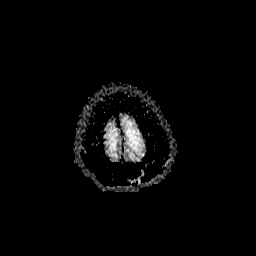

[Series 500: DWI · coronal · 5.0mm · 1.09mm/px · 2 of 33 slices shown (4 of 4)]
[im 1/33]
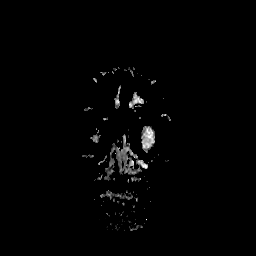
[im 33/33]
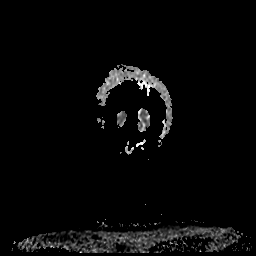

[Series 1701: ph1/cor cemra ft · coronal · 1.4mm · 0.59mm/px · 4 of 100 slices shown]
[im 1/100]
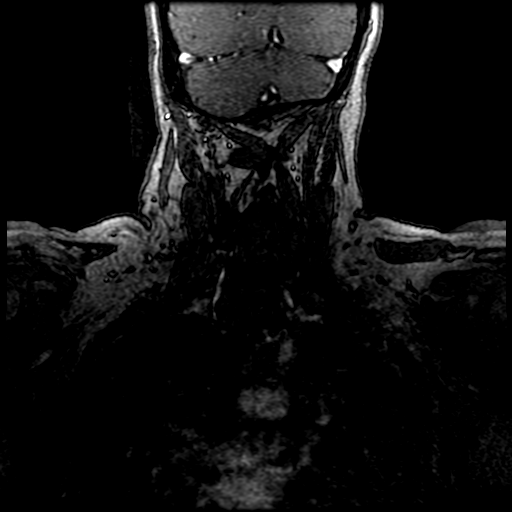
[im 25/100]
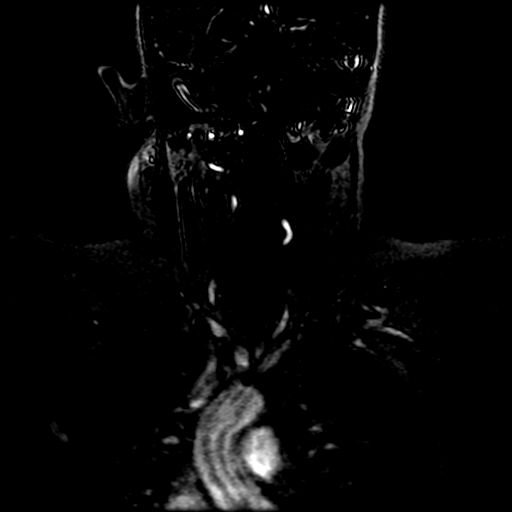
[im 50/100]
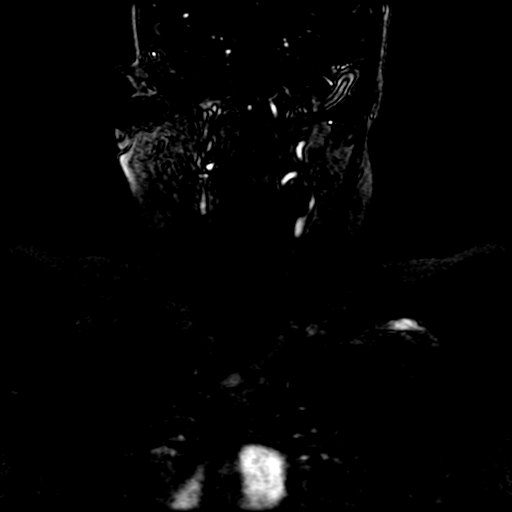
[im 75/100]
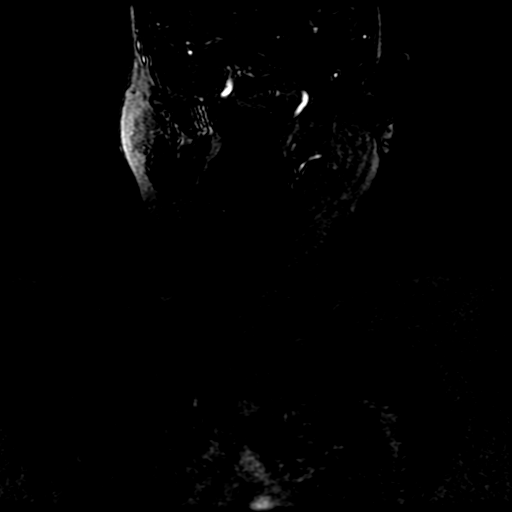

[25 of 48 positions shown; findings below may reference images not displayed]

FINDINGS: MRI HEAD FINDINGS

The diffusion-weighted images demonstrate no evidence for acute or
subacute infarction. No acute hemorrhage or mass lesion is present.
A remote cortical infarct is present in the posterior right parietal
lobe. Mild periventricular T2 changes are present bilaterally. There
is mild generalized atrophy.

Ventricles are of normal size. The brainstem and cerebellum are
unremarkable. No significant extra-axial fluid collection is
present.

Flow is present in the major intracranial arteries. The globes and
orbits are intact. The paranasal sinuses and mastoid air cells are
clear.

Postcontrast images demonstrate no pathologic enhancement.

MRA NECK FINDINGS

Diffusion-weighted images demonstrate no significant flow
disturbance at either carotid bifurcation.

Postcontrast images demonstrate a 3 vessel arch configuration. No
definite stenosis is present at the origin of the great vessels. The
right common carotid artery is within normal limits. Atherosclerotic
changes are present at bifurcation without a significant stenosis.

The left common carotid artery is within normal limits.
Atherosclerotic changes are present at the bifurcation and
approximately 2 cm beyond the bifurcation with mild stenoses of less
than 50% at these levels.

Signal loss in the proximal vertebral arteries is likely
artifactual. The the left vertebral artery is the dominant vessel.
There are no significant stenoses.
IMPRESSION: 1. Remote punctate cortical infarct in the right parietal lobe.
2. Mild white matter disease likely reflects chronic microvascular
ischemic change.
3. Mild atherosclerotic changes at the carotid bifurcations
bilaterally without significant stenoses of greater than 50%.

## 2017-08-04 DIAGNOSIS — J449 Chronic obstructive pulmonary disease, unspecified: Secondary | ICD-10-CM | POA: Diagnosis not present

## 2017-09-04 DIAGNOSIS — J449 Chronic obstructive pulmonary disease, unspecified: Secondary | ICD-10-CM | POA: Diagnosis not present

## 2017-09-08 DIAGNOSIS — M797 Fibromyalgia: Secondary | ICD-10-CM | POA: Diagnosis not present

## 2017-09-08 DIAGNOSIS — J449 Chronic obstructive pulmonary disease, unspecified: Secondary | ICD-10-CM | POA: Diagnosis not present

## 2017-09-08 DIAGNOSIS — I10 Essential (primary) hypertension: Secondary | ICD-10-CM | POA: Diagnosis not present

## 2017-09-08 DIAGNOSIS — K219 Gastro-esophageal reflux disease without esophagitis: Secondary | ICD-10-CM | POA: Diagnosis not present

## 2017-09-08 DIAGNOSIS — F411 Generalized anxiety disorder: Secondary | ICD-10-CM | POA: Diagnosis not present

## 2017-09-08 DIAGNOSIS — E7800 Pure hypercholesterolemia, unspecified: Secondary | ICD-10-CM | POA: Diagnosis not present

## 2017-10-02 DIAGNOSIS — J449 Chronic obstructive pulmonary disease, unspecified: Secondary | ICD-10-CM | POA: Diagnosis not present

## 2017-11-02 DIAGNOSIS — J449 Chronic obstructive pulmonary disease, unspecified: Secondary | ICD-10-CM | POA: Diagnosis not present

## 2017-12-02 DIAGNOSIS — J449 Chronic obstructive pulmonary disease, unspecified: Secondary | ICD-10-CM | POA: Diagnosis not present

## 2017-12-14 ENCOUNTER — Other Ambulatory Visit: Payer: Self-pay | Admitting: Family Medicine

## 2017-12-14 DIAGNOSIS — Z1231 Encounter for screening mammogram for malignant neoplasm of breast: Secondary | ICD-10-CM

## 2018-01-02 DIAGNOSIS — J449 Chronic obstructive pulmonary disease, unspecified: Secondary | ICD-10-CM | POA: Diagnosis not present

## 2018-01-07 ENCOUNTER — Ambulatory Visit
Admission: RE | Admit: 2018-01-07 | Discharge: 2018-01-07 | Disposition: A | Discharge status: Home / Self Care | Payer: Medicare HMO | Source: Ambulatory Visit | Attending: Family Medicine | Admitting: Family Medicine

## 2018-01-07 DIAGNOSIS — Z1231 Encounter for screening mammogram for malignant neoplasm of breast: Secondary | ICD-10-CM | POA: Diagnosis not present

## 2018-01-28 ENCOUNTER — Other Ambulatory Visit: Payer: Self-pay

## 2018-01-28 DIAGNOSIS — Z9889 Other specified postprocedural states: Secondary | ICD-10-CM

## 2018-01-28 DIAGNOSIS — I779 Disorder of arteries and arterioles, unspecified: Secondary | ICD-10-CM

## 2018-01-28 DIAGNOSIS — I739 Peripheral vascular disease, unspecified: Secondary | ICD-10-CM

## 2018-02-01 DIAGNOSIS — J449 Chronic obstructive pulmonary disease, unspecified: Secondary | ICD-10-CM | POA: Diagnosis not present

## 2018-02-15 ENCOUNTER — Ambulatory Visit: Payer: Medicare HMO | Admitting: Family

## 2018-02-15 ENCOUNTER — Encounter: Payer: Self-pay | Admitting: Family

## 2018-02-15 ENCOUNTER — Ambulatory Visit (HOSPITAL_COMMUNITY)
Admission: RE | Admit: 2018-02-15 | Discharge: 2018-02-15 | Disposition: A | Discharge status: Home / Self Care | Payer: Medicare HMO | Source: Ambulatory Visit | Attending: Vascular Surgery | Admitting: Vascular Surgery

## 2018-02-15 ENCOUNTER — Ambulatory Visit (INDEPENDENT_AMBULATORY_CARE_PROVIDER_SITE_OTHER)
Admission: RE | Admit: 2018-02-15 | Discharge: 2018-02-15 | Disposition: A | Discharge status: Home / Self Care | Payer: Medicare HMO | Source: Ambulatory Visit | Attending: Vascular Surgery | Admitting: Vascular Surgery

## 2018-02-15 ENCOUNTER — Other Ambulatory Visit: Payer: Self-pay

## 2018-02-15 VITALS — BP 160/100 | HR 57 | Temp 97.3°F | Resp 16 | Ht 60.0 in | Wt 184.1 lb

## 2018-02-15 DIAGNOSIS — Z9889 Other specified postprocedural states: Secondary | ICD-10-CM

## 2018-02-15 DIAGNOSIS — E785 Hyperlipidemia, unspecified: Secondary | ICD-10-CM | POA: Insufficient documentation

## 2018-02-15 DIAGNOSIS — I739 Peripheral vascular disease, unspecified: Secondary | ICD-10-CM | POA: Diagnosis not present

## 2018-02-15 DIAGNOSIS — I779 Disorder of arteries and arterioles, unspecified: Secondary | ICD-10-CM | POA: Diagnosis not present

## 2018-02-15 DIAGNOSIS — F17200 Nicotine dependence, unspecified, uncomplicated: Secondary | ICD-10-CM

## 2018-02-15 DIAGNOSIS — I6523 Occlusion and stenosis of bilateral carotid arteries: Secondary | ICD-10-CM

## 2018-02-15 DIAGNOSIS — I10 Essential (primary) hypertension: Secondary | ICD-10-CM | POA: Insufficient documentation

## 2018-02-15 NOTE — Progress Notes (Signed)
[facility] [place] HISTORY AND PHYSICAL   CC: Follow up extracranial carotid artery stenosis and peripheral artery occlusive disease    History of Present Illness:   Norma Scott is a 67 y.o. female who returns for follow up of carotid stenosis, and lower extremity vascular disease. She is status post right carotid endarterectomy in July of 2010 by Dr. Myra Gianotti for asymptomatic stenosis.  She had lumbar spine surgery Nov., 2013, states her right foot is often weak, but her legs feel stronger since the back surgery and she rarely falls compared to before her back surgery when she had frequent falls.   She will be seeing her back surgeon re sciatic pain and numbness in right buttock and leg. Her walking seems to be limited by this.  Patient denies non healing ulcers on lower extremities.   She has had 3 TIA's in 2016 and 2017 as manifested by tingling on both sides of lips, blurred vision in both eyes, and slurred speech,  sx's lasted less than an hour. Last TIA was in November 2017. Was evaluated by a neurologist in [place], Dr. Everlena Cooper, since her [facility] Share; she last saw him in April 2017. She is ambidextrous.   She is scheduled to see her PCP in September 2019.   She states her blood pressure at home is about 160/80-90.    Pt Diabetic: No Pt smoker: smoker (1 ppd/day x 50+ yrs), states [employer] works for her, she smokes less but did not quit  Pt meds include: Statin :no, pt states she ran out a couple of years ago, has not seen Dr. Adriana Mccallum to re prescribe.  ASA: no Other anticoagulants/antiplatelets: Plavix started since one of her TIA's in 2016    Past Medical History:  Diagnosis Date  . Allergy    allergic rhinitis  . Arteriosclerotic coronary artery disease 05/21/2015  . Asthma   . CAD (coronary artery disease)    patient says no problems   dr Williemae Natter smith pcp  . Carotid artery occlusion   . Cervical disc displacement    x2  . COPD  (chronic obstructive pulmonary disease) (HCC)   . Esophageal reflux   . Essential hypertension 05/21/2015  . Family history of adverse reaction to anesthesia    mother hard to wake and affected b/p  . Fibromyalgia   . Gall stones   . Generalized anxiety disorder   . Hyperlipidemia   . Lumbar disc herniation    L5-S1  . Mini stroke (HCC)   . TIA (transient ischemic attack) 05/21/2015    Social History Social History   Tobacco Use  . Smoking status: Current Some Day Smoker    Packs/day: 0.50    Years: 45.00    Pack years: 22.50    Types: Cigarettes  . Smokeless tobacco: Never Used  . Tobacco comment: states she only smokes about 1/2 pack  Substance Use Topics  . Alcohol use: Yes    Alcohol/week: 0.0 oz    Comment: 2 times a year  . Drug use: No    Family History Family History  Problem Relation Age of Onset  . Stroke Mother   . Hypertension Mother   . Hyperlipidemia Mother   . Seizures Mother        Grand mal seizures  . Emphysema Mother   . Diabetes Father   . Heart disease Father        Heart Disease before age 61  . Stroke Maternal Grandmother   .  Heart Problems Maternal Grandmother   . Cancer Maternal Grandfather        stomach cancer  . Heart attack Maternal Grandfather   . Heart disease Paternal Grandfather   . Heart attack Paternal Grandfather   . Deep vein thrombosis Brother   . Asthma Brother   . Cancer Paternal Grandmother [id number]  . Heart disease Paternal Uncle   . Heart attack Paternal Uncle     Surgical History Past Surgical History:  Procedure Laterality Date  . BACK SURGERY  06/01/2012   lumbar  . CAROTID ENDARTERECTOMY  02/01/2009   Right  . CERVICAL FUSION     1995, 1996  . CHOLECYSTECTOMY    . CHOLECYSTECTOMY    . ECTOPIC PREGNANCY SURGERY  89  . ERCP N/A 11/20/2015   Procedure: ENDOSCOPIC RETROGRADE CHOLANGIOPANCREATOGRAPHY (ERCP);  Surgeon: Willis ModenaWilliam Outlaw, MD;  [facility];  Service: Endoscopy;  Laterality: N/A;  . EUS  N/A 11/20/2015   Procedure: UPPER ENDOSCOPIC ULTRASOUND (EUS) RADIAL;  Surgeon: Willis ModenaWilliam Outlaw, MD;  Location: [facility];  Service: Endoscopy;  Laterality: N/A;  . TUBAL LIGATION      Allergies  Allergen Reactions  . Wellbutrin [Bupropion] Other (See Comments)    Hallucinations  . Septra [Sulfamethoxazole-Trimethoprim] Nausea And Vomiting    Vomiting  . Sulfa Antibiotics Nausea And Vomiting  . Zithromax [Azithromycin] Hives  . Cymbalta [Duloxetine Hcl] Other (See Comments)    fatigue    Current Outpatient Medications  Medication Sig Dispense Refill  . albuterol (PROVENTIL HFA) 108 (90 Base) MCG/ACT inhaler Inhale 1 puff into the lungs every 6 (six) hours as needed for wheezing or shortness of breath. 1 Inhaler 0  . alendronate (FOSAMAX) 70 MG tablet Take 70 mg by mouth once a week. Take on Mondays    . atorvastatin (LIPITOR) 40 MG tablet Take 40 mg by mouth at bedtime.     . clonazePAM (KLONOPIN) 0.5 MG tablet Take 1 mg by mouth every evening.     . cyclobenzaprine (FLEXERIL) 10 MG tablet Take 0.5-1 tablets (5-10 mg total) by mouth 3 (three) times daily as needed for muscle spasms. (Patient taking differently: Take 5-10 mg by mouth 3 (three) times daily. ) 50 tablet 1  . FLUoxetine (PROZAC) 20 MG capsule Take 60 mg by mouth daily.     Marland Kitchen. gabapentin (NEURONTIN) 300 MG capsule Take 300-600 mg by mouth 3 (three) times daily. 300 mg twice daily. 600 mg at night    . lisinopril (PRINIVIL,ZESTRIL) 5 MG tablet Take 5 mg by mouth daily.    . metoprolol tartrate (LOPRESSOR) 25 MG tablet Take 25 mg by mouth 2 (two) times daily.    Marland Kitchen. omeprazole (PRILOSEC) 20 MG capsule Take 20 mg by mouth 2 (two) times daily as needed. For reflux    . acetaminophen (TYLENOL) 325 MG tablet Take 2 tablets (650 mg total) by mouth every 6 (six) hours as needed for mild pain (or Fever >/= 101). (Patient not taking: Reported on 02/15/2018) 30 tablet 0  . clopidogrel (PLAVIX) 75 MG tablet TAKE ONE TABLET BY MOUTH ONCE  DAILY (Patient not taking: Reported on 02/15/2018) 30 tablet 7   No current facility-administered medications for this visit.      REVIEW OF SYSTEMS: See HPI for pertinent positives and negatives.  Physical Examination Vitals:   02/15/18 1421 02/15/18 1433  BP: (!) 160/90 (!) 160/100  Pulse: (!) 57   Resp: 16   Temp: (!) 97.3 F (36.3 C)   TempSrc: Oral  SpO2: 92%   Weight: 184 lb 1.6 oz (83.5 kg)   Height: 5' (1.524 m)    Body mass index is 35.95 kg/m.  General:  WDWN obese female in NAD, central adiposity Gait: Normal HENT: WNL Eyes: PERRLA Pulmonary: normal non-labored breathing, fair air movement in all fields, CTAB, no rales, rhonchi, or wheezing. moist cough, moist sounding exhalations.  Cardiac: RRR, no murmur detected Abdomen: softly obese, NT, no masses palpated Skin: no rashes, no ulcers, no cellulitis.   VASCULAR EXAM  Carotid Bruits Right Left   Negative Negative      Radial pulses are 1+ right, 2+ left palpable  Adominal aortic pulse is not palpable                      VASCULAR EXAM: Extremities without ischemic changes, without Gangrene; without open wounds. 1+ non pitting soft edema left ankle.                                                                                                           LE Pulses Right Left       FEMORAL  not palpable  2+ palpable        POPLITEAL  not palpable   not palpable       POSTERIOR TIBIAL  2+ palpable   not palpable        DORSALIS PEDIS      ANTERIOR TIBIAL 1+ palpable  not palpable     Musculoskeletal: no muscle wasting or atrophy.  Neurologic:  A&O X 3; appropriate affect, sensation is normal; speech is normal, CN 2-12 intact, pain and light touch intact in extremities, motor exam as listed above. Psychiatric: Normal thought content, mood appropriate to clinical situation.    ASSESSMENT:  Moniqua Engebretsen is a 67 y.o. female who is s/p right carotid endarterectomy 02/01/2009 and has  moderate left ilio-femoral arterial occlusive disease. She had TIA' in 2016 and 2017, was seeing a neurologist, has not been seen since April 2017, states she needs to make an appointment. See MRA of head/neck above.  Her right sciatic pain limits her walking before she walks far enough to claudicate,  no signs of ischemia in her feet/legs. Right femoral pulses is not palpable, left is 2+ palpable.   Her atherosclerotic risk factors include active smoking for over 50 years, CAD, hypertension, COPD, and obesity. She takes Plavix, a statin, and a beta blocker.   I advised her to see her PCP re her elevated blood pressure.   DATA  Carotid Duplex (02-15-18): Right ICA: CEA site with 1-39% stenosis Left ICA: 40-59% stenosis Bilateral vertebral artery flow is antegrade.  Bilateral subclavian artery waveforms are normal.  Slight increased stenosis in the right ICA compared to the exam on 06-08-16.    ABI (Date: 02/15/2018):  R:   ABI: 0.90 (was 0.98 on 06-08-16),   PT: bi  DP: bi  TBI:  0.64 (was 0.61)  L:   ABI: 0.70 (was 0.77),   PT: bi  DP: bi  TBI: 0.39 (was 0.45)  Decline in bilateral ABI and left TBI, stable right TBI; mild disease in the right, moderate in the left; all biphasic waveforms.    PLAN:   The patient was counseled re smoking cessation and given several free resources re smoking cessation.  Daily seated leg exercises demonstrated and discussed.    Based on today's exam and non-invasive vascular lab results, the patient will follow up in 1 year with the following tests: ABI's and carotid duplex. I advised pt to notify us if she develops concerns re the circulation in her feet or legs.   I discussed in depth with the patient the nature of atherosclerosis, and emphasized the importance of maximal medical management including strict control of blood pressure, blood glucose, and lipid levels, obtaining regular exercise, and cessation of smoking.  The  patient is aware that without maximal medical management the underlying atherosclerotic disease process will progress, limiting the benefit of any interventions.  The patient was given information about stroke prevention and what symptoms should prompt the patient to seek immediate medical care.  The patient was given information about PAD including signs, symptoms, treatment, what symptoms should prompt the patient to seek immediate medical care, and risk reduction measures to take.  Thank you for allowing [place] to participate in this patient's care.  Charisse March, RN, MSN, FNP-C [facility] Office: [phone]  Clinic MD: Early 02/15/2018 2:44 PM

## 2018-02-15 NOTE — Patient Instructions (Signed)
Steps to Quit Smoking Smoking tobacco can be bad for your health. It can also affect almost every organ in your body. Smoking puts you and people around you at risk for many serious long-lasting (chronic) diseases. Quitting smoking is hard, but it is one of the best things that you can do for your health. It is never too late to quit. What are the benefits of quitting smoking? When you quit smoking, you lower your risk for getting serious diseases and conditions. They can include:  Lung cancer or lung disease.  Heart disease.  Stroke.  Heart attack.  Not being able to have children (infertility).  Weak bones (osteoporosis) and broken bones (fractures).  If you have coughing, wheezing, and shortness of breath, those symptoms may get better when you quit. You may also get sick less often. If you are pregnant, quitting smoking can help to lower your chances of having a baby of low birth weight. What can I do to help me quit smoking? Talk with your doctor about what can help you quit smoking. Some things you can do (strategies) include:  Quitting smoking totally, instead of slowly cutting back how much you smoke over a period of time.  Going to in-person counseling. You are more likely to quit if you go to many counseling sessions.  Using resources and support systems, such as: ? Online chats with a counselor. ? Phone quitlines. ? Printed self-help materials. ? Support groups or group counseling. ? Text messaging programs. ? Mobile phone apps or applications.  Taking medicines. Some of these medicines may have nicotine in them. If you are pregnant or breastfeeding, do not take any medicines to quit smoking unless your doctor says it is okay. Talk with your doctor about counseling or other things that can help you.  Talk with your doctor about using more than one strategy at the same time, such as taking medicines while you are also going to in-person counseling. This can help make  quitting easier. What things can I do to make it easier to quit? Quitting smoking might feel very hard at first, but there is a lot that you can do to make it easier. Take these steps:  Talk to your family and friends. Ask them to support and encourage you.  Call phone quitlines, reach out to support groups, or work with a counselor.  Ask people who smoke to not smoke around you.  Avoid places that make you want (trigger) to smoke, such as: ? Bars. ? Parties. ? Smoke-break areas at work.  Spend time with people who do not smoke.  Lower the stress in your life. Stress can make you want to smoke. Try these things to help your stress: ? Getting regular exercise. ? Deep-breathing exercises. ? Yoga. ? Meditating. ? Doing a body scan. To do this, close your eyes, focus on one area of your body at a time from head to toe, and notice which parts of your body are tense. Try to relax the muscles in those areas.  Download or buy apps on your mobile phone or tablet that can help you stick to your quit plan. There are many free apps, such as QuitGuide from the CDC (Centers for Disease Control and Prevention). You can find more support from smokefree.gov and other websites.  This information is not intended to replace advice given to you by your health care provider. Make sure you discuss any questions you have with your health care provider. Document Released: 05/09/2009 Document   Revised: 03/10/2016 Document Reviewed: 11/27/2014 Elsevier Interactive Patient Education  2018 Elsevier Inc.   Stroke Prevention Some health problems and behaviors may make it more likely for you to have a stroke. Below are ways to lessen your risk of having a stroke.  Be active for at least 30 minutes on most or all days.  Do not smoke. Try not to be around others who smoke.  Do not drink too much alcohol. ? Do not have more than 2 drinks a day if you are a man. ? Do not have more than 1 drink a day if you are a  woman and are not pregnant.  Eat healthy foods, such as fruits and vegetables. If you were put on a specific diet, follow the diet as told.  Keep your cholesterol levels under control through diet and medicines. Look for foods that are low in saturated fat, trans fat, cholesterol, and are high in fiber.  If you have diabetes, follow all diet plans and take your medicine as told.  Ask your doctor if you need treatment to lower your blood pressure. If you have high blood pressure (hypertension), follow all diet plans and take your medicine as told by your doctor.  If you are 18-39 years old, have your blood pressure checked every 3-5 years. If you are age 40 or older, have your blood pressure checked every year.  Keep a healthy weight. Eat foods that are low in calories, salt, saturated fat, trans fat, and cholesterol.  Do not take drugs.  Avoid birth control pills, if this applies. Talk to your doctor about the risks of taking birth control pills.  Talk to your doctor if you have sleep problems (sleep apnea).  Take all medicine as told by your doctor. ? You may be told to take aspirin or blood thinner medicine. Take this medicine as told by your doctor. ? Understand your medicine instructions.  Make sure any other conditions you have are being taken care of.  Get help right away if:  You suddenly lose feeling (you feel numb) or have weakness in your face, arm, or leg.  Your face or eyelid hangs down to one side.  You suddenly feel confused.  You have trouble talking (aphasia) or understanding what people are saying.  You suddenly have trouble seeing in one or both eyes.  You suddenly have trouble walking.  You are dizzy.  You lose your balance or your movements are clumsy (uncoordinated).  You suddenly have a very bad headache and you do not know the cause.  You have new chest pain.  Your heart feels like it is fluttering or skipping a beat (irregular heartbeat). Do  not wait to see if the symptoms above go away. Get help right away. Call your local emergency services (911 in [place] Do not drive yourself to the hospital. This information is not intended to replace advice given to you by your health care provider. Make sure you discuss any questions you have with your health care provider. Document Released: 01/12/2012 Document Revised: 12/19/2015 Document Reviewed: 01/13/2013 Elsevier Interactive Patient Education  2018 Elsevier Inc.     Peripheral Vascular Disease Peripheral vascular disease (PVD) is a disease of the blood vessels that are not part of your heart and brain. A simple term for PVD is poor circulation. In most cases, PVD narrows the blood vessels that carry blood from your heart to the rest of your body. This can result in a decreased supply of blood to   your arms, legs, and internal organs, like your stomach or kidneys. However, it most often affects a person's lower legs and feet. There are two types of PVD.  Organic PVD. This is the more common type. It is caused by damage to the structure of blood vessels.  Functional PVD. This is caused by conditions that make blood vessels contract and tighten (spasm).  Without treatment, PVD tends to get worse over time. PVD can also lead to acute ischemic limb. This is when an arm or limb suddenly has trouble getting enough blood. This is a medical emergency. Follow these instructions at home:  Take medicines only as told by your doctor.  Do not use any tobacco products, including cigarettes, chewing tobacco, or electronic cigarettes. If you need help quitting, ask your doctor.  Lose weight if you are overweight, and maintain a healthy weight as told by your doctor.  Eat a diet that is low in fat and cholesterol. If you need help, ask your doctor.  Exercise regularly. Ask your doctor for some good activities for you.  Take good care of your feet. ? Wear comfortable shoes that fit  well. ? Check your feet often for any cuts or sores. Contact a doctor if:  You have cramps in your legs while walking.  You have leg pain when you are at rest.  You have coldness in a leg or foot.  Your skin changes.  You are unable to get or have an erection (erectile dysfunction).  You have cuts or sores on your feet that are not healing. Get help right away if:  Your arm or leg turns cold and blue.  Your arms or legs become red, warm, swollen, painful, or numb.  You have chest pain or trouble breathing.  You suddenly have weakness in your face, arm, or leg.  You become very confused or you cannot speak.  You suddenly have a very bad headache.  You suddenly cannot see. This information is not intended to replace advice given to you by your health care provider. Make sure you discuss any questions you have with your health care provider. Document Released: 10/07/2009 Document Revised: 12/19/2015 Document Reviewed: 12/21/2013 Elsevier Interactive Patient Education  2017 Elsevier Inc.  

## 2018-03-11 ENCOUNTER — Ambulatory Visit
Admission: RE | Admit: 2018-03-11 | Discharge: 2018-03-11 | Disposition: A | Discharge status: Home / Self Care | Payer: Medicare HMO | Source: Ambulatory Visit | Attending: Neurosurgery | Admitting: Neurosurgery

## 2018-03-11 ENCOUNTER — Other Ambulatory Visit: Payer: Self-pay | Admitting: Neurosurgery

## 2018-03-11 DIAGNOSIS — M545 Low back pain: Secondary | ICD-10-CM | POA: Diagnosis not present

## 2018-03-11 DIAGNOSIS — Z981 Arthrodesis status: Secondary | ICD-10-CM | POA: Diagnosis not present

## 2018-03-11 DIAGNOSIS — M5136 Other intervertebral disc degeneration, lumbar region: Secondary | ICD-10-CM | POA: Diagnosis not present

## 2018-03-11 DIAGNOSIS — M546 Pain in thoracic spine: Secondary | ICD-10-CM | POA: Diagnosis not present

## 2018-03-11 DIAGNOSIS — Z6836 Body mass index (BMI) 36.0-36.9, adult: Secondary | ICD-10-CM | POA: Diagnosis not present

## 2018-03-11 DIAGNOSIS — L255 Unspecified contact dermatitis due to plants, except food: Secondary | ICD-10-CM | POA: Diagnosis not present

## 2018-03-11 DIAGNOSIS — M4726 Other spondylosis with radiculopathy, lumbar region: Secondary | ICD-10-CM | POA: Diagnosis not present

## 2018-03-11 DIAGNOSIS — M5416 Radiculopathy, lumbar region: Secondary | ICD-10-CM | POA: Diagnosis not present

## 2018-03-11 DIAGNOSIS — M4316 Spondylolisthesis, lumbar region: Secondary | ICD-10-CM | POA: Diagnosis not present

## 2018-03-15 DIAGNOSIS — M4726 Other spondylosis with radiculopathy, lumbar region: Secondary | ICD-10-CM | POA: Diagnosis not present

## 2018-03-15 DIAGNOSIS — M48061 Spinal stenosis, lumbar region without neurogenic claudication: Secondary | ICD-10-CM | POA: Diagnosis not present

## 2018-03-15 DIAGNOSIS — M4326 Fusion of spine, lumbar region: Secondary | ICD-10-CM | POA: Diagnosis not present

## 2018-03-16 DIAGNOSIS — M4726 Other spondylosis with radiculopathy, lumbar region: Secondary | ICD-10-CM | POA: Diagnosis not present

## 2018-03-16 DIAGNOSIS — M48062 Spinal stenosis, lumbar region with neurogenic claudication: Secondary | ICD-10-CM | POA: Diagnosis not present

## 2018-03-16 DIAGNOSIS — M4316 Spondylolisthesis, lumbar region: Secondary | ICD-10-CM | POA: Diagnosis not present

## 2018-03-16 DIAGNOSIS — M5136 Other intervertebral disc degeneration, lumbar region: Secondary | ICD-10-CM | POA: Diagnosis not present

## 2018-03-31 DIAGNOSIS — F411 Generalized anxiety disorder: Secondary | ICD-10-CM | POA: Diagnosis not present

## 2018-03-31 DIAGNOSIS — Z23 Encounter for immunization: Secondary | ICD-10-CM | POA: Diagnosis not present

## 2018-03-31 DIAGNOSIS — E7800 Pure hypercholesterolemia, unspecified: Secondary | ICD-10-CM | POA: Diagnosis not present

## 2018-03-31 DIAGNOSIS — I739 Peripheral vascular disease, unspecified: Secondary | ICD-10-CM | POA: Diagnosis not present

## 2018-03-31 DIAGNOSIS — Z0000 Encounter for general adult medical examination without abnormal findings: Secondary | ICD-10-CM | POA: Diagnosis not present

## 2018-03-31 DIAGNOSIS — M797 Fibromyalgia: Secondary | ICD-10-CM | POA: Diagnosis not present

## 2018-03-31 DIAGNOSIS — I10 Essential (primary) hypertension: Secondary | ICD-10-CM | POA: Diagnosis not present

## 2018-03-31 DIAGNOSIS — Z1389 Encounter for screening for other disorder: Secondary | ICD-10-CM | POA: Diagnosis not present

## 2018-03-31 DIAGNOSIS — K219 Gastro-esophageal reflux disease without esophagitis: Secondary | ICD-10-CM | POA: Diagnosis not present

## 2018-08-19 IMAGING — DX DG CHEST 2V
2 series · 2 of 2 positions shown · non-contrast
Comparison: Radiographs 10/23/2013

CLINICAL DATA: Cough and shortness of breath today.

EXAM:
CHEST  2 VIEW

[chest pa]
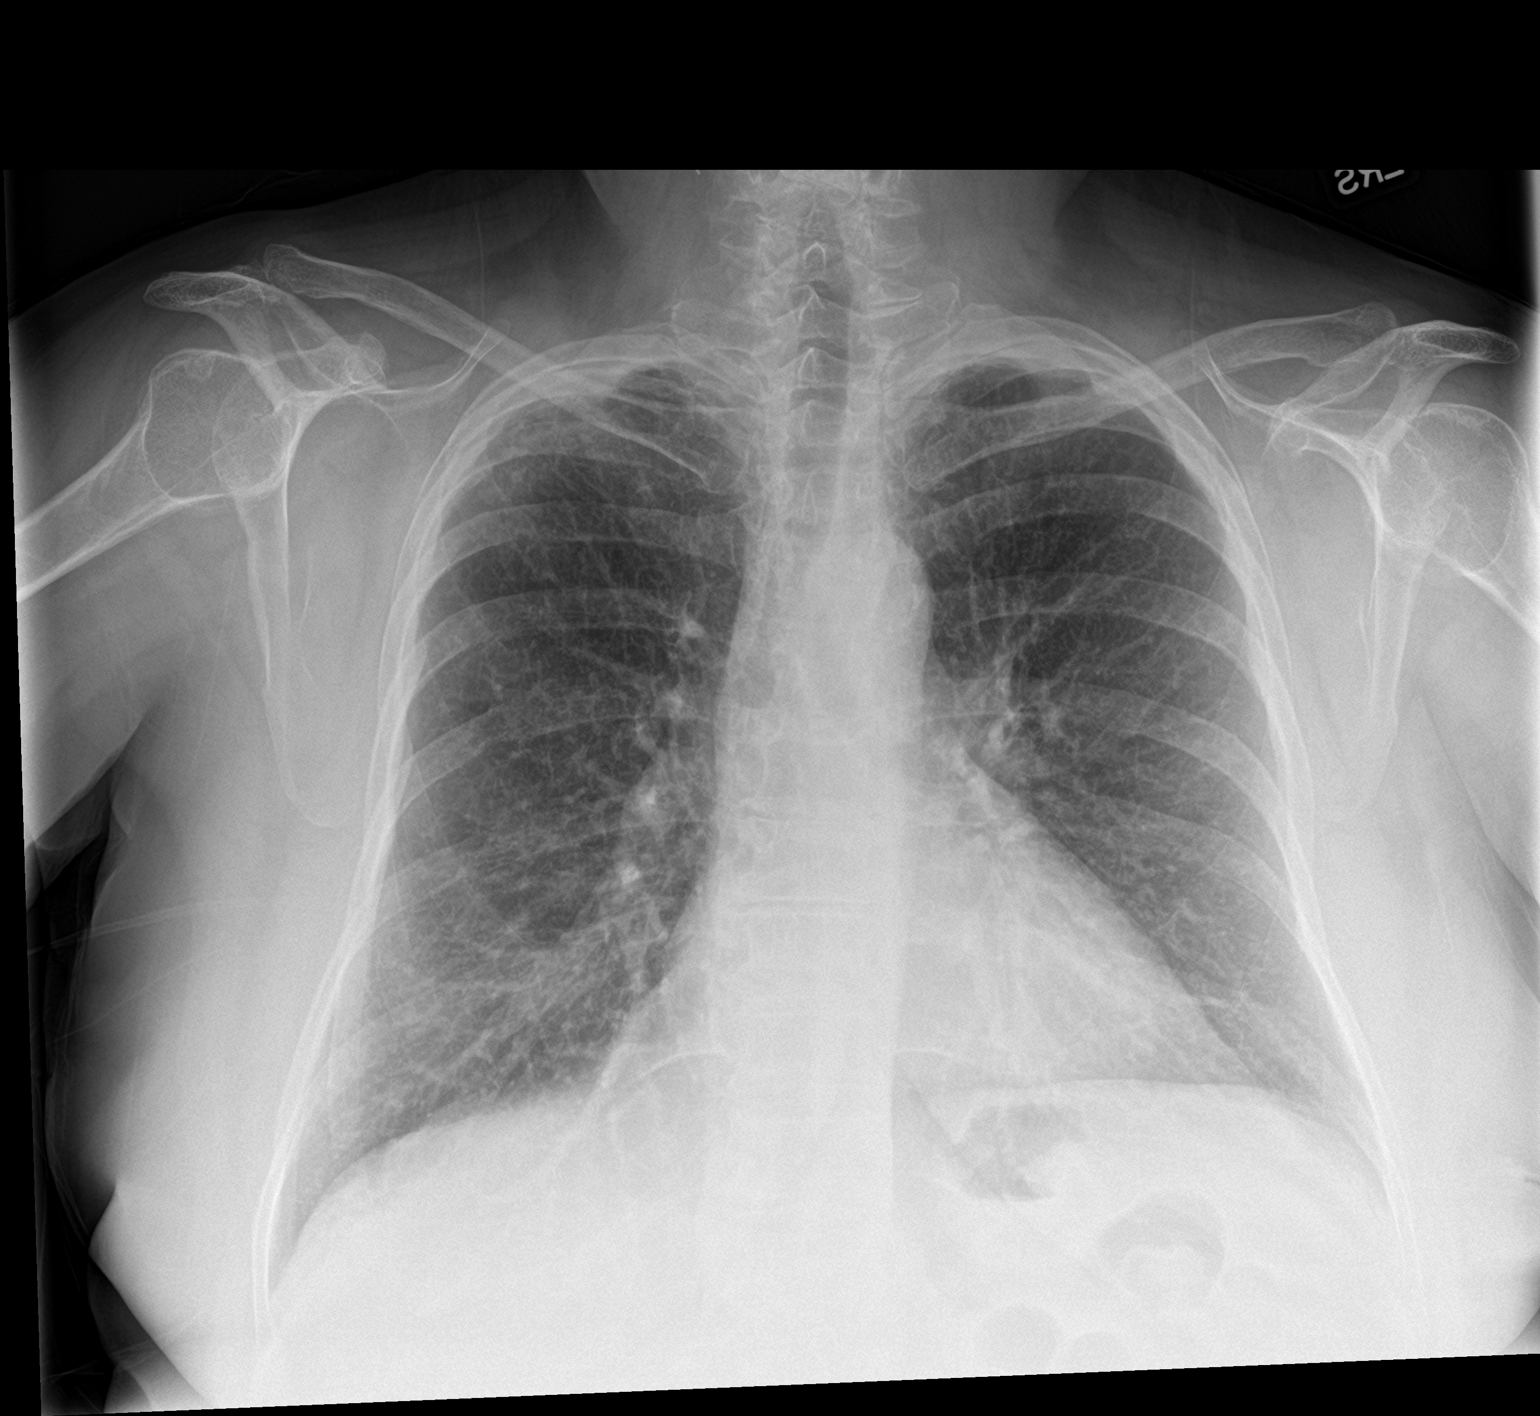

[chest lat]
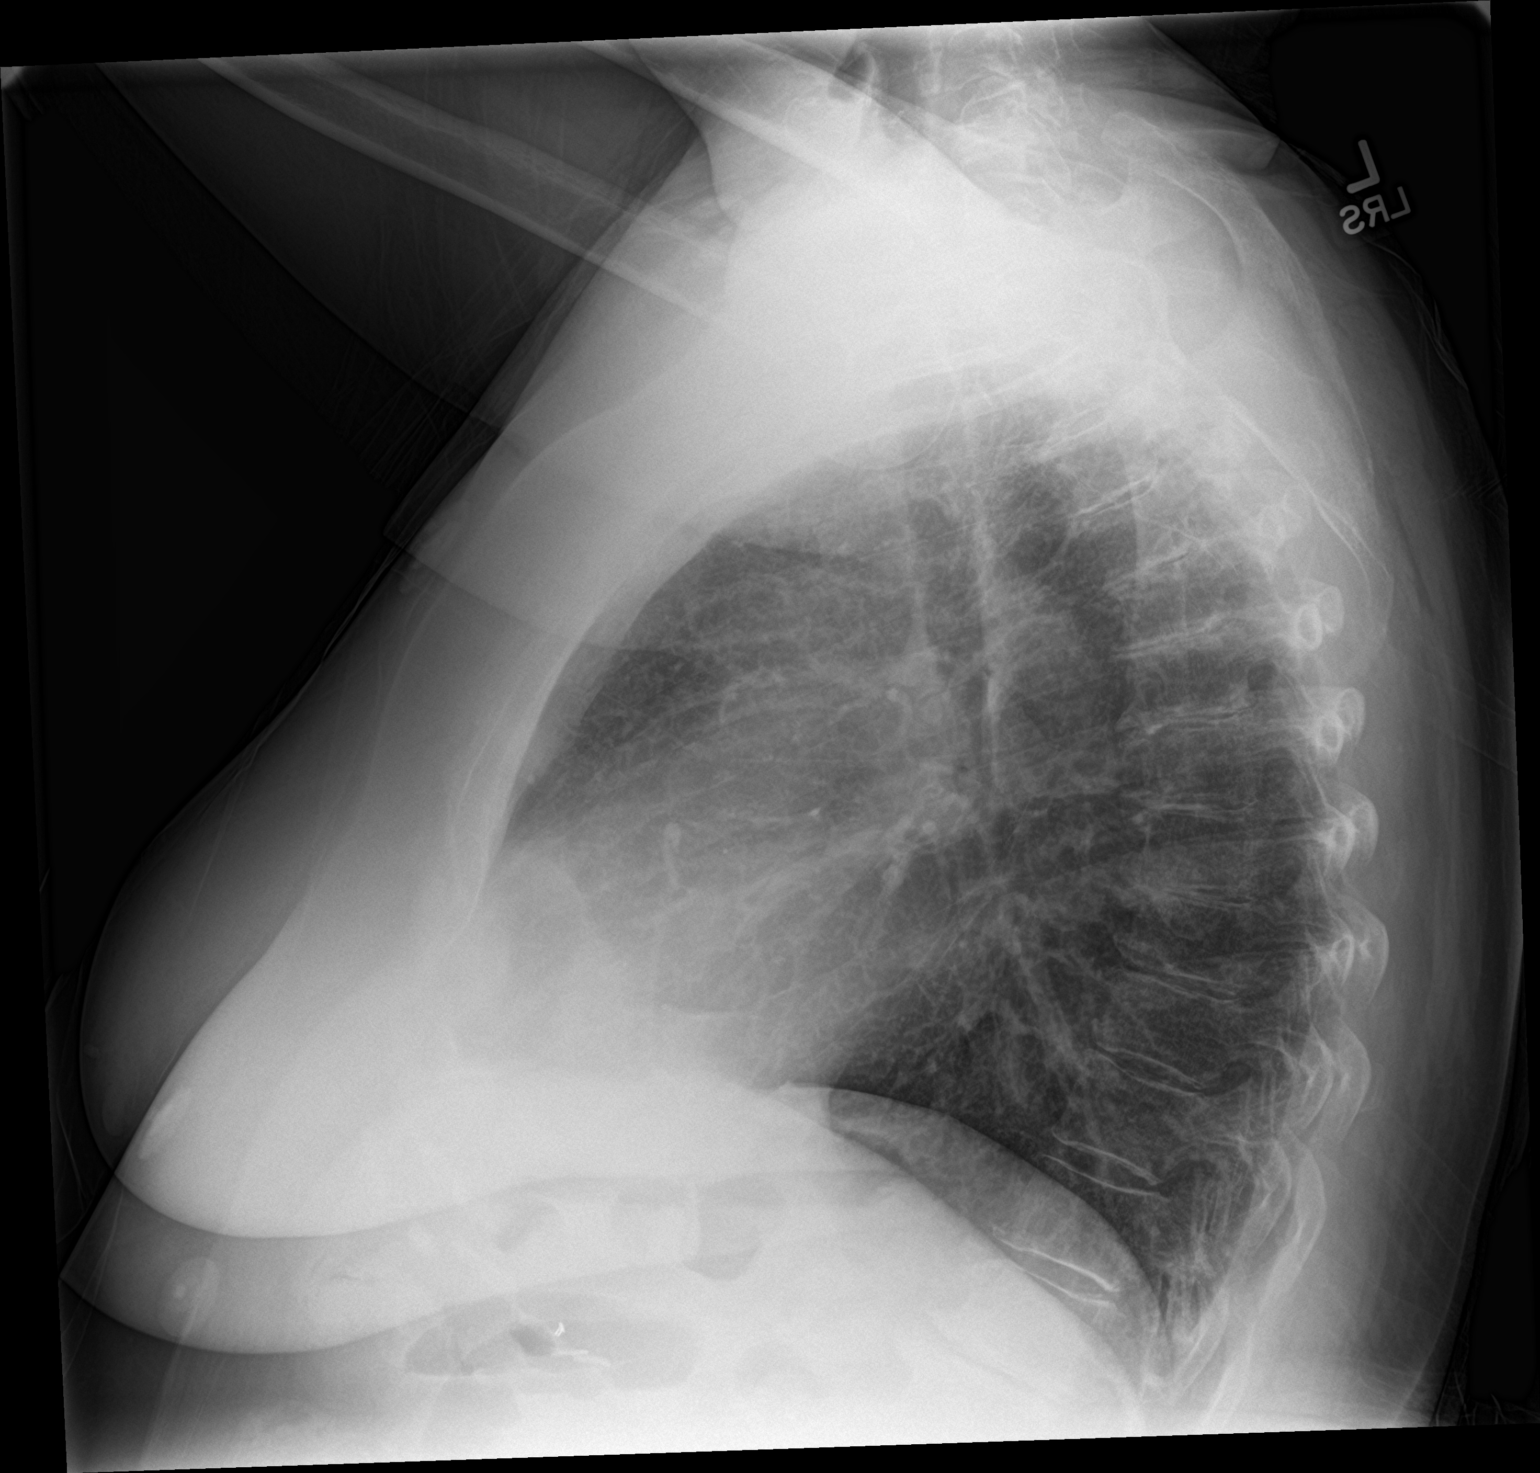

[2 of 2 positions shown; findings below may reference images not displayed]

FINDINGS: The cardiomediastinal contours are normal. There is bronchial
thickening and interstitial coarsening that is new. Biapical
pleuroparenchymal scarring. No confluent airspace disease. No
pleural effusion or pneumothorax. No acute osseous abnormalities are
seen.
IMPRESSION: Bronchial thickening and interstitial coarsening, new, suggesting
acute bronchitis. Pulmonary edema is felt less likely.

## 2018-10-11 DIAGNOSIS — Z23 Encounter for immunization: Secondary | ICD-10-CM | POA: Diagnosis not present

## 2018-10-11 DIAGNOSIS — K219 Gastro-esophageal reflux disease without esophagitis: Secondary | ICD-10-CM | POA: Diagnosis not present

## 2018-10-11 DIAGNOSIS — E7800 Pure hypercholesterolemia, unspecified: Secondary | ICD-10-CM | POA: Diagnosis not present

## 2018-10-11 DIAGNOSIS — F411 Generalized anxiety disorder: Secondary | ICD-10-CM | POA: Diagnosis not present

## 2018-10-11 DIAGNOSIS — I739 Peripheral vascular disease, unspecified: Secondary | ICD-10-CM | POA: Diagnosis not present

## 2018-10-11 DIAGNOSIS — M797 Fibromyalgia: Secondary | ICD-10-CM | POA: Diagnosis not present

## 2018-10-11 DIAGNOSIS — I10 Essential (primary) hypertension: Secondary | ICD-10-CM | POA: Diagnosis not present

## 2018-10-11 DIAGNOSIS — J449 Chronic obstructive pulmonary disease, unspecified: Secondary | ICD-10-CM | POA: Diagnosis not present

## 2018-11-30 DIAGNOSIS — R944 Abnormal results of kidney function studies: Secondary | ICD-10-CM | POA: Diagnosis not present

## 2019-01-25 DIAGNOSIS — Z981 Arthrodesis status: Secondary | ICD-10-CM | POA: Diagnosis not present

## 2019-01-25 DIAGNOSIS — M48062 Spinal stenosis, lumbar region with neurogenic claudication: Secondary | ICD-10-CM | POA: Diagnosis not present

## 2019-01-25 DIAGNOSIS — M5136 Other intervertebral disc degeneration, lumbar region: Secondary | ICD-10-CM | POA: Diagnosis not present

## 2019-01-25 DIAGNOSIS — M4726 Other spondylosis with radiculopathy, lumbar region: Secondary | ICD-10-CM | POA: Diagnosis not present

## 2019-01-25 DIAGNOSIS — M4316 Spondylolisthesis, lumbar region: Secondary | ICD-10-CM | POA: Diagnosis not present

## 2019-01-26 ENCOUNTER — Other Ambulatory Visit: Payer: Self-pay | Admitting: Neurosurgery

## 2019-01-26 DIAGNOSIS — M48062 Spinal stenosis, lumbar region with neurogenic claudication: Secondary | ICD-10-CM

## 2019-02-01 ENCOUNTER — Other Ambulatory Visit (HOSPITAL_COMMUNITY): Payer: Self-pay | Admitting: Neurosurgery

## 2019-02-01 DIAGNOSIS — M48062 Spinal stenosis, lumbar region with neurogenic claudication: Secondary | ICD-10-CM

## 2019-02-03 ENCOUNTER — Other Ambulatory Visit: Payer: Self-pay

## 2019-02-03 ENCOUNTER — Ambulatory Visit (HOSPITAL_COMMUNITY)
Admission: RE | Admit: 2019-02-03 | Discharge: 2019-02-03 | Disposition: A | Discharge status: Home / Self Care | Payer: Medicare HMO | Source: Ambulatory Visit | Attending: Neurosurgery | Admitting: Neurosurgery

## 2019-02-03 DIAGNOSIS — S3210XA Unspecified fracture of sacrum, initial encounter for closed fracture: Secondary | ICD-10-CM | POA: Diagnosis not present

## 2019-02-03 DIAGNOSIS — M48062 Spinal stenosis, lumbar region with neurogenic claudication: Secondary | ICD-10-CM | POA: Diagnosis not present

## 2019-02-03 DIAGNOSIS — M47815 Spondylosis without myelopathy or radiculopathy, thoracolumbar region: Secondary | ICD-10-CM | POA: Diagnosis not present

## 2019-02-03 DIAGNOSIS — I739 Peripheral vascular disease, unspecified: Secondary | ICD-10-CM | POA: Diagnosis not present

## 2019-02-03 MED ORDER — GADOBUTROL 1 MMOL/ML IV SOLN
10.0000 mL | Freq: Once | INTRAVENOUS | Status: AC | PRN
  Administered 2019-02-03: 10 mL via INTRAVENOUS

## 2019-02-15 DIAGNOSIS — M8448XA Pathological fracture, other site, initial encounter for fracture: Secondary | ICD-10-CM | POA: Diagnosis not present

## 2019-02-15 DIAGNOSIS — M5441 Lumbago with sciatica, right side: Secondary | ICD-10-CM | POA: Diagnosis not present

## 2019-02-27 DIAGNOSIS — M545 Low back pain: Secondary | ICD-10-CM | POA: Diagnosis not present

## 2019-04-11 DIAGNOSIS — Z0000 Encounter for general adult medical examination without abnormal findings: Secondary | ICD-10-CM | POA: Diagnosis not present

## 2019-04-11 DIAGNOSIS — I10 Essential (primary) hypertension: Secondary | ICD-10-CM | POA: Diagnosis not present

## 2019-04-11 DIAGNOSIS — M8000XD Age-related osteoporosis with current pathological fracture, unspecified site, subsequent encounter for fracture with routine healing: Secondary | ICD-10-CM | POA: Diagnosis not present

## 2019-04-11 DIAGNOSIS — E7800 Pure hypercholesterolemia, unspecified: Secondary | ICD-10-CM | POA: Diagnosis not present

## 2019-04-11 DIAGNOSIS — I739 Peripheral vascular disease, unspecified: Secondary | ICD-10-CM | POA: Diagnosis not present

## 2019-04-11 DIAGNOSIS — F411 Generalized anxiety disorder: Secondary | ICD-10-CM | POA: Diagnosis not present

## 2019-04-11 DIAGNOSIS — E2839 Other primary ovarian failure: Secondary | ICD-10-CM | POA: Diagnosis not present

## 2019-04-11 DIAGNOSIS — M797 Fibromyalgia: Secondary | ICD-10-CM | POA: Diagnosis not present

## 2019-04-11 DIAGNOSIS — Z1389 Encounter for screening for other disorder: Secondary | ICD-10-CM | POA: Diagnosis not present

## 2019-04-13 ENCOUNTER — Other Ambulatory Visit: Payer: Self-pay | Admitting: Family Medicine

## 2019-04-13 DIAGNOSIS — E2839 Other primary ovarian failure: Secondary | ICD-10-CM

## 2019-04-25 DIAGNOSIS — J449 Chronic obstructive pulmonary disease, unspecified: Secondary | ICD-10-CM | POA: Diagnosis not present

## 2019-04-25 DIAGNOSIS — M810 Age-related osteoporosis without current pathological fracture: Secondary | ICD-10-CM | POA: Diagnosis not present

## 2019-04-25 DIAGNOSIS — E7800 Pure hypercholesterolemia, unspecified: Secondary | ICD-10-CM | POA: Diagnosis not present

## 2019-04-25 DIAGNOSIS — I10 Essential (primary) hypertension: Secondary | ICD-10-CM | POA: Diagnosis not present

## 2019-05-22 DIAGNOSIS — I10 Essential (primary) hypertension: Secondary | ICD-10-CM | POA: Diagnosis not present

## 2019-05-22 DIAGNOSIS — E7800 Pure hypercholesterolemia, unspecified: Secondary | ICD-10-CM | POA: Diagnosis not present

## 2019-05-22 DIAGNOSIS — J449 Chronic obstructive pulmonary disease, unspecified: Secondary | ICD-10-CM | POA: Diagnosis not present

## 2019-05-22 DIAGNOSIS — M810 Age-related osteoporosis without current pathological fracture: Secondary | ICD-10-CM | POA: Diagnosis not present

## 2019-06-20 DIAGNOSIS — I10 Essential (primary) hypertension: Secondary | ICD-10-CM | POA: Diagnosis not present

## 2019-06-20 DIAGNOSIS — E7800 Pure hypercholesterolemia, unspecified: Secondary | ICD-10-CM | POA: Diagnosis not present

## 2019-06-26 DIAGNOSIS — M810 Age-related osteoporosis without current pathological fracture: Secondary | ICD-10-CM | POA: Diagnosis not present

## 2019-06-26 DIAGNOSIS — I10 Essential (primary) hypertension: Secondary | ICD-10-CM | POA: Diagnosis not present

## 2019-06-26 DIAGNOSIS — J449 Chronic obstructive pulmonary disease, unspecified: Secondary | ICD-10-CM | POA: Diagnosis not present

## 2019-06-26 DIAGNOSIS — E7800 Pure hypercholesterolemia, unspecified: Secondary | ICD-10-CM | POA: Diagnosis not present

## 2019-07-26 DIAGNOSIS — M810 Age-related osteoporosis without current pathological fracture: Secondary | ICD-10-CM | POA: Diagnosis not present

## 2019-07-26 DIAGNOSIS — E7800 Pure hypercholesterolemia, unspecified: Secondary | ICD-10-CM | POA: Diagnosis not present

## 2019-07-26 DIAGNOSIS — J449 Chronic obstructive pulmonary disease, unspecified: Secondary | ICD-10-CM | POA: Diagnosis not present

## 2019-07-26 DIAGNOSIS — I10 Essential (primary) hypertension: Secondary | ICD-10-CM | POA: Diagnosis not present

## 2019-07-27 ENCOUNTER — Ambulatory Visit
Admission: RE | Admit: 2019-07-27 | Discharge: 2019-07-27 | Disposition: A | Discharge status: Home / Self Care | Payer: Medicare HMO | Source: Ambulatory Visit | Attending: Family Medicine | Admitting: Family Medicine

## 2019-07-27 ENCOUNTER — Other Ambulatory Visit: Payer: Self-pay

## 2019-07-27 DIAGNOSIS — E2839 Other primary ovarian failure: Secondary | ICD-10-CM

## 2019-07-27 DIAGNOSIS — M8589 Other specified disorders of bone density and structure, multiple sites: Secondary | ICD-10-CM | POA: Diagnosis not present

## 2019-07-27 DIAGNOSIS — Z780 Asymptomatic menopausal state: Secondary | ICD-10-CM | POA: Diagnosis not present

## 2019-09-05 DIAGNOSIS — M810 Age-related osteoporosis without current pathological fracture: Secondary | ICD-10-CM | POA: Diagnosis not present

## 2019-09-05 DIAGNOSIS — I10 Essential (primary) hypertension: Secondary | ICD-10-CM | POA: Diagnosis not present

## 2019-09-05 DIAGNOSIS — J449 Chronic obstructive pulmonary disease, unspecified: Secondary | ICD-10-CM | POA: Diagnosis not present

## 2019-09-05 DIAGNOSIS — E7800 Pure hypercholesterolemia, unspecified: Secondary | ICD-10-CM | POA: Diagnosis not present

## 2019-10-11 DIAGNOSIS — F411 Generalized anxiety disorder: Secondary | ICD-10-CM | POA: Diagnosis not present

## 2019-10-11 DIAGNOSIS — J449 Chronic obstructive pulmonary disease, unspecified: Secondary | ICD-10-CM | POA: Diagnosis not present

## 2019-10-11 DIAGNOSIS — E7800 Pure hypercholesterolemia, unspecified: Secondary | ICD-10-CM | POA: Diagnosis not present

## 2019-10-11 DIAGNOSIS — R609 Edema, unspecified: Secondary | ICD-10-CM | POA: Diagnosis not present

## 2019-10-11 DIAGNOSIS — M797 Fibromyalgia: Secondary | ICD-10-CM | POA: Diagnosis not present

## 2019-10-11 DIAGNOSIS — K219 Gastro-esophageal reflux disease without esophagitis: Secondary | ICD-10-CM | POA: Diagnosis not present

## 2019-10-11 DIAGNOSIS — I10 Essential (primary) hypertension: Secondary | ICD-10-CM | POA: Diagnosis not present

## 2019-10-11 DIAGNOSIS — M810 Age-related osteoporosis without current pathological fracture: Secondary | ICD-10-CM | POA: Diagnosis not present

## 2019-10-18 DIAGNOSIS — I10 Essential (primary) hypertension: Secondary | ICD-10-CM | POA: Diagnosis not present

## 2019-10-18 DIAGNOSIS — M810 Age-related osteoporosis without current pathological fracture: Secondary | ICD-10-CM | POA: Diagnosis not present

## 2019-10-18 DIAGNOSIS — E7800 Pure hypercholesterolemia, unspecified: Secondary | ICD-10-CM | POA: Diagnosis not present

## 2019-10-18 DIAGNOSIS — J449 Chronic obstructive pulmonary disease, unspecified: Secondary | ICD-10-CM | POA: Diagnosis not present

## 2019-10-26 DIAGNOSIS — M810 Age-related osteoporosis without current pathological fracture: Secondary | ICD-10-CM | POA: Diagnosis not present

## 2019-10-26 DIAGNOSIS — J449 Chronic obstructive pulmonary disease, unspecified: Secondary | ICD-10-CM | POA: Diagnosis not present

## 2019-10-26 DIAGNOSIS — I10 Essential (primary) hypertension: Secondary | ICD-10-CM | POA: Diagnosis not present

## 2019-10-26 DIAGNOSIS — E7800 Pure hypercholesterolemia, unspecified: Secondary | ICD-10-CM | POA: Diagnosis not present

## 2019-11-17 ENCOUNTER — Other Ambulatory Visit: Payer: Self-pay | Admitting: Family Medicine

## 2019-11-17 DIAGNOSIS — Z1231 Encounter for screening mammogram for malignant neoplasm of breast: Secondary | ICD-10-CM

## 2019-12-04 ENCOUNTER — Encounter (HOSPITAL_COMMUNITY): Payer: Medicare HMO

## 2019-12-04 ENCOUNTER — Ambulatory Visit: Payer: Medicare HMO

## 2019-12-04 ENCOUNTER — Other Ambulatory Visit: Payer: Self-pay | Admitting: *Deleted

## 2019-12-04 DIAGNOSIS — I779 Disorder of arteries and arterioles, unspecified: Secondary | ICD-10-CM

## 2019-12-04 DIAGNOSIS — I6523 Occlusion and stenosis of bilateral carotid arteries: Secondary | ICD-10-CM

## 2019-12-06 ENCOUNTER — Encounter (HOSPITAL_COMMUNITY): Payer: Medicare HMO

## 2019-12-06 ENCOUNTER — Ambulatory Visit: Payer: Medicare HMO

## 2019-12-06 NOTE — Progress Notes (Deleted)
No show

## 2019-12-07 DIAGNOSIS — I10 Essential (primary) hypertension: Secondary | ICD-10-CM | POA: Diagnosis not present

## 2019-12-07 DIAGNOSIS — M810 Age-related osteoporosis without current pathological fracture: Secondary | ICD-10-CM | POA: Diagnosis not present

## 2019-12-07 DIAGNOSIS — J449 Chronic obstructive pulmonary disease, unspecified: Secondary | ICD-10-CM | POA: Diagnosis not present

## 2019-12-07 DIAGNOSIS — E7800 Pure hypercholesterolemia, unspecified: Secondary | ICD-10-CM | POA: Diagnosis not present

## 2019-12-29 ENCOUNTER — Other Ambulatory Visit: Payer: Self-pay | Admitting: *Deleted

## 2019-12-29 DIAGNOSIS — I739 Peripheral vascular disease, unspecified: Secondary | ICD-10-CM

## 2019-12-29 DIAGNOSIS — I6523 Occlusion and stenosis of bilateral carotid arteries: Secondary | ICD-10-CM

## 2019-12-29 DIAGNOSIS — I779 Disorder of arteries and arterioles, unspecified: Secondary | ICD-10-CM

## 2020-01-03 ENCOUNTER — Ambulatory Visit (INDEPENDENT_AMBULATORY_CARE_PROVIDER_SITE_OTHER)
Admission: RE | Admit: 2020-01-03 | Discharge: 2020-01-03 | Disposition: A | Discharge status: Home / Self Care | Payer: Medicare HMO | Source: Ambulatory Visit | Attending: Surgery | Admitting: Surgery

## 2020-01-03 ENCOUNTER — Ambulatory Visit: Payer: Medicare HMO | Admitting: Physician Assistant

## 2020-01-03 ENCOUNTER — Encounter: Payer: Self-pay | Admitting: Physician Assistant

## 2020-01-03 ENCOUNTER — Other Ambulatory Visit: Payer: Self-pay

## 2020-01-03 ENCOUNTER — Ambulatory Visit (HOSPITAL_COMMUNITY)
Admission: RE | Admit: 2020-01-03 | Discharge: 2020-01-03 | Disposition: A | Discharge status: Home / Self Care | Payer: Medicare HMO | Source: Ambulatory Visit | Attending: Surgery | Admitting: Surgery

## 2020-01-03 VITALS — BP 172/86 | HR 62 | Resp 20 | Ht 60.0 in | Wt 187.4 lb

## 2020-01-03 DIAGNOSIS — Z9889 Other specified postprocedural states: Secondary | ICD-10-CM | POA: Diagnosis not present

## 2020-01-03 DIAGNOSIS — I6523 Occlusion and stenosis of bilateral carotid arteries: Secondary | ICD-10-CM

## 2020-01-03 DIAGNOSIS — I739 Peripheral vascular disease, unspecified: Secondary | ICD-10-CM

## 2020-01-03 DIAGNOSIS — I779 Disorder of arteries and arterioles, unspecified: Secondary | ICD-10-CM | POA: Diagnosis not present

## 2020-01-03 NOTE — Progress Notes (Signed)
Office Note     CC:  follow up Requesting Provider:  Carol Ada, MD  HPI: Norma Scott is a 69 y.o. (10/14/1950) female who presents for follow up of carotid stenosis and peripheral vascular disease. She is status post right carotid endarterectomy in July of 2010 by Dr. Trula Slade for asymptomatic stenosis. She has history of TIA's but has not had any since her last visit in 2019. She presently is without any TIA or stroke symptoms. She denies any trouble talking, amaurosis, facial drooping, numbness or weakness on one side of her body  She has hx of multiple lower back surgeries and continues to have complications and issues with this causing numbness and pain when ambulating. Sometimes it makes it difficult to differentiate if the pain is in her legs or her back. She does have frequent falls due to the pain and weakness in her legs caused by this. She most recently has been having pain that starts in the mid low back and radiates down her left leg. This is worsened by ambulation. She denies any claudication symptoms, rest pain or non healing wounds. She does report swelling mostly in left leg. She does not regularly elevate her legs. This does not seem to be an issue for her  She has known left common iliac occlusion. She does continue to smoke presently 1/2 ppd. She said she does intermittently use chantix and is working on decreasing her smoking  The pt is not on a statin for cholesterol management.  The pt not on a daily aspirin.   Other AC: none The pt is on BB and ACE for hypertension.   The pt  Is not diabetic.  Tobacco hx:  Current smoker 1/2 ppd  Past Medical History:  Diagnosis Date  . Allergy    allergic rhinitis  . Arteriosclerotic coronary artery disease 05/21/2015  . Asthma   . CAD (coronary artery disease)    patient says no problems   dr Duwaine Maxin smith pcp  . Carotid artery occlusion   . Cervical disc displacement    x2  . COPD (chronic obstructive pulmonary  disease) ([place])   . Esophageal reflux   . Essential hypertension 05/21/2015  . Family history of adverse reaction to anesthesia    mother hard to wake and affected b/p  . Fibromyalgia   . Gall stones   . Generalized anxiety disorder   . Hyperlipidemia   . Lumbar disc herniation    L5-S1  . Mini stroke ([place])   . TIA (transient ischemic attack) 05/21/2015    Past Surgical History:  Procedure Laterality Date  . BACK SURGERY  06/01/2012   lumbar  . CAROTID ENDARTERECTOMY  02/01/2009   Right  . [place]  . CHOLECYSTECTOMY    . CHOLECYSTECTOMY    . ECTOPIC PREGNANCY SURGERY  89  . ERCP N/A 11/20/2015   Procedure: ENDOSCOPIC RETROGRADE CHOLANGIOPANCREATOGRAPHY (ERCP);  Surgeon: Arta Silence, MD;  Location: [facility];  Service: Endoscopy;  Laterality: N/A;  . EUS N/A 11/20/2015   Procedure: UPPER ENDOSCOPIC ULTRASOUND (EUS) RADIAL;  Surgeon: Arta Silence, MD;  Location: [facility];  Service: Endoscopy;  Laterality: N/A;  . TUBAL LIGATION      Social History   Socioeconomic History  . Marital status: Legally Separated    Spouse name: Not on file  . Number of children: Not on file  . Years of education: Not on file  . Highest education level: Not on file  Occupational History  . Not on file  Tobacco Use  . Smoking status: Current Some Day Smoker    Packs/day: 0.50    Years: 45.00    Pack years: 22.50    Types: Cigarettes  . Smokeless tobacco: Never Used  . Tobacco comment: states she only smokes about 1/2 pack  Substance and Sexual Activity  . Alcohol use: Yes    Alcohol/week: 0.0 standard drinks    Comment: 2 times a year  . Drug use: No  . Sexual activity: Not on file  Other Topics Concern  . Not on file  Social History Narrative   Epworth Sleepiness Scale = 10 (as of 05/21/2015)   Social Determinants of Health   Financial Resource Strain:   . Difficulty of Paying Living Expenses:   Food Insecurity:   . Worried About Patent examiner in the Last Year:   . Barista in the Last Year:   Transportation Needs:   . Freight forwarder (Medical):   Marland Kitchen Lack of Transportation (Non-Medical):   Physical Activity:   . Days of Exercise per Week:   . Minutes of Exercise per Session:   Stress:   . Feeling of Stress :   Social Connections:   . Frequency of Communication with Friends and Family:   . Frequency of Social Gatherings with Friends and Family:   . Attends Religious Services:   . Active Member of Clubs or Organizations:   . Attends Banker Meetings:   Marland Kitchen Marital Status:   Intimate Partner Violence:   . Fear of Current or Ex-Partner:   . Emotionally Abused:   Marland Kitchen Physically Abused:   . Sexually Abused:     Family History  Problem Relation Age of Onset  . Stroke Mother   . Hypertension Mother   . Hyperlipidemia Mother   . Seizures Mother        Grand mal seizures  . Emphysema Mother   . Diabetes Father   . Heart disease Father        Heart Disease before age 2  . Stroke Maternal Grandmother   . Heart Problems Maternal Grandmother   . Cancer Maternal Grandfather        stomach cancer  . Heart attack Maternal Grandfather   . Heart disease Paternal Grandfather   . Heart attack Paternal Grandfather   . Deep vein thrombosis Brother   . Asthma Brother   . Cancer Paternal Grandmother 60  . Heart disease Paternal Uncle   . Heart attack Paternal Uncle     Current Outpatient Medications  Medication Sig Dispense Refill  . albuterol (PROVENTIL HFA) 108 (90 Base) MCG/ACT inhaler Inhale 1 puff into the lungs every 6 (six) hours as needed for wheezing or shortness of breath. 1 Inhaler 0  . alendronate (FOSAMAX) 70 MG tablet Take 70 mg by mouth once a week. Take on Mondays    . atorvastatin (LIPITOR) 40 MG tablet Take 40 mg by mouth at bedtime.     . clonazePAM (KLONOPIN) 0.5 MG tablet Take 1 mg by mouth every evening.     . cyclobenzaprine (FLEXERIL) 10 MG tablet Take 0.5-1 tablets (5-10  mg total) by mouth 3 (three) times daily as needed for muscle spasms. (Patient taking differently: Take 5-10 mg by mouth 3 (three) times daily. ) 50 tablet 1  . FLUoxetine (PROZAC) 20 MG capsule Take 60 mg by mouth daily.     . furosemide (LASIX) 20 MG  tablet furosemide 20 mg tablet    . gabapentin (NEURONTIN) 300 MG capsule Take 300-600 mg by mouth 3 (three) times daily. 300 mg twice daily. 600 mg at night    . lisinopril (ZESTRIL) 30 MG tablet     . metoprolol tartrate (LOPRESSOR) 25 MG tablet Take 25 mg by mouth 2 (two) times daily.    Marland Kitchen omeprazole (PRILOSEC) 40 MG capsule     . Tdap (BOOSTRIX) 5-2.5-18.5 LF-MCG/0.5 injection Boostrix Tdap 2.5 Lf unit-8 mcg-5 Lf/0.5 mL intramuscular syringe     No current facility-administered medications for this visit.    Allergies  Allergen Reactions  . Bupropion Other (See Comments)    Hallucinations Other reaction(s): Hallucinations  . Septra [Sulfamethoxazole-Trimethoprim] Nausea And Vomiting    Vomiting  . Sulfa Antibiotics Nausea And Vomiting  . Zithromax [Azithromycin] Hives  . Cymbalta [Duloxetine Hcl] Other (See Comments)    fatigue     REVIEW OF SYSTEMS:  [X]  denotes positive finding, [ ]  denotes negative finding Cardiac  Comments:  Chest pain or chest pressure:    Shortness of breath upon exertion:    Short of breath when lying flat:    Irregular heart rhythm:        Vascular    Pain in calf, thigh, or hip brought on by ambulation:    Pain in feet at night that wakes you up from your sleep:     Blood clot in your veins:    Leg swelling:         Pulmonary    Oxygen at home:    Productive cough:     Wheezing:         Neurologic    Sudden weakness in arms or legs:     Sudden numbness in arms or legs:     Sudden onset of difficulty speaking or slurred speech:    Temporary loss of vision in one eye:     Problems with dizziness:         Gastrointestinal    Blood in stool:     Vomited blood:         Genitourinary     Burning when urinating:     Blood in urine:        Psychiatric    Major depression:         Hematologic    Bleeding problems:    Problems with blood clotting too easily:        Skin    Rashes or ulcers:        Constitutional    Fever or chills:      PHYSICAL EXAMINATION:  Vitals:   01/03/20 1528  BP: (!) 172/86  Pulse: 62  Resp: 20  SpO2: 97%  Weight: 187 lb 6.4 oz (85 kg)  Height: 5' (1.524 m)    General:  WDWN in NAD; vital signs documented above Gait: Normal HENT: WNL, normocephalic Pulmonary: normal non-labored breathing ,diminished at bases with inspiratory wheezing Cardiac: regular HR, without  Murmurs without carotid bruit Abdomen: soft, NT, no masses Skin: without rashes Vascular Exam/Pulses:  Right Left  Radial 2+ (normal) 2+ (normal)  Ulnar 2+ (normal) 2+ (normal)  Femoral 2+ (normal) 2+ (normal)  Popliteal 2+ (normal) 2+ (normal)  DP 1+ (weak) 1+ (weak)  PT Not palpable Not palpable   Extremities: without ischemic changes, without Gangrene , without cellulitis; without open wounds;  Musculoskeletal: no muscle wasting or atrophy  Neurologic: A&O X 3;  No focal weakness or paresthesias  are detected Psychiatric:  The pt has Normal affect.   Non-Invasive Vascular Imaging:   01/03/20 +-------+-----------+-----------+------------+------------+  ABI/TBIToday's ABIToday's TBIPrevious ABIPrevious TBI  +-------+-----------+-----------+------------+------------+  Right 0.92    0.62    0.90    0.64      +-------+-----------+-----------+------------+------------+  Left  0.66    0.47    0.70    0.39      +-------+-----------+-----------+------------+------------+   VAS US Carotid Duplex Bilateral: Right Carotid: Velocities in the right ICA are consistent with a 1-39% stenosis.   Left Carotid: Velocities in the left ICA are consistent with a 40-59% stenosis.   Vertebrals: Bilateral vertebral arteries  demonstrate antegrade flow.  Subclavians: Normal flow hemodynamics were seen in bilateral subclavian arteries.   ASSESSMENT/PLAN:: 69 y.o. female here for follow up for carotid stenosis and peripheral vascular disease. Her carotid disease is stable. She has not had any TIA or stroke like symptoms. She continues to have some lower extremity pain but not clearly claudication or vascular related. Her legs are well perfused and warm with palpable pulses. Her non invasive studies are stable.  - encourage her to continue to exercise as tolerated due to her back pain - she will continue her statin - discussed continued adequate blood pressure control - she will call for earlier follow up if she has worsening lower extremity symptoms or new TIA/stroke like symptoms - She will follow up in 1 year for carotid duplex and bilateral ABIs  Graceann Congress, PA-C [facility] [phone]  Clinic MD:  Dr. Darrick Penna

## 2020-02-21 ENCOUNTER — Emergency Department (HOSPITAL_COMMUNITY): Payer: Medicare HMO

## 2020-02-21 ENCOUNTER — Inpatient Hospital Stay (HOSPITAL_COMMUNITY)
Admission: EM | Admit: 2020-02-21 | Discharge: 2020-02-23 | DRG: 042 | Disposition: A | Discharge status: Home / Self Care | Payer: Medicare HMO | Attending: Internal Medicine | Admitting: Internal Medicine

## 2020-02-21 DIAGNOSIS — M549 Dorsalgia, unspecified: Secondary | ICD-10-CM | POA: Diagnosis present

## 2020-02-21 DIAGNOSIS — G8929 Other chronic pain: Secondary | ICD-10-CM | POA: Diagnosis present

## 2020-02-21 DIAGNOSIS — E669 Obesity, unspecified: Secondary | ICD-10-CM | POA: Diagnosis present

## 2020-02-21 DIAGNOSIS — G8324 Monoplegia of upper limb affecting left nondominant side: Secondary | ICD-10-CM | POA: Diagnosis present

## 2020-02-21 DIAGNOSIS — I639 Cerebral infarction, unspecified: Secondary | ICD-10-CM | POA: Diagnosis present

## 2020-02-21 DIAGNOSIS — Z981 Arthrodesis status: Secondary | ICD-10-CM

## 2020-02-21 DIAGNOSIS — R200 Anesthesia of skin: Secondary | ICD-10-CM | POA: Diagnosis present

## 2020-02-21 DIAGNOSIS — I6340 Cerebral infarction due to embolism of unspecified cerebral artery: Secondary | ICD-10-CM | POA: Diagnosis not present

## 2020-02-21 DIAGNOSIS — G9389 Other specified disorders of brain: Secondary | ICD-10-CM | POA: Diagnosis present

## 2020-02-21 DIAGNOSIS — R29700 NIHSS score 0: Secondary | ICD-10-CM | POA: Diagnosis present

## 2020-02-21 DIAGNOSIS — Z833 Family history of diabetes mellitus: Secondary | ICD-10-CM

## 2020-02-21 DIAGNOSIS — Z8673 Personal history of transient ischemic attack (TIA), and cerebral infarction without residual deficits: Secondary | ICD-10-CM | POA: Diagnosis not present

## 2020-02-21 DIAGNOSIS — Z823 Family history of stroke: Secondary | ICD-10-CM

## 2020-02-21 DIAGNOSIS — G319 Degenerative disease of nervous system, unspecified: Secondary | ICD-10-CM | POA: Diagnosis not present

## 2020-02-21 DIAGNOSIS — F17210 Nicotine dependence, cigarettes, uncomplicated: Secondary | ICD-10-CM | POA: Diagnosis present

## 2020-02-21 DIAGNOSIS — R29818 Other symptoms and signs involving the nervous system: Secondary | ICD-10-CM | POA: Diagnosis not present

## 2020-02-21 DIAGNOSIS — I739 Peripheral vascular disease, unspecified: Secondary | ICD-10-CM | POA: Diagnosis present

## 2020-02-21 DIAGNOSIS — M810 Age-related osteoporosis without current pathological fracture: Secondary | ICD-10-CM | POA: Diagnosis not present

## 2020-02-21 DIAGNOSIS — J449 Chronic obstructive pulmonary disease, unspecified: Secondary | ICD-10-CM | POA: Diagnosis present

## 2020-02-21 DIAGNOSIS — I63433 Cerebral infarction due to embolism of bilateral posterior cerebral arteries: Secondary | ICD-10-CM | POA: Diagnosis present

## 2020-02-21 DIAGNOSIS — R4781 Slurred speech: Secondary | ICD-10-CM | POA: Diagnosis present

## 2020-02-21 DIAGNOSIS — Z79899 Other long term (current) drug therapy: Secondary | ICD-10-CM

## 2020-02-21 DIAGNOSIS — I63413 Cerebral infarction due to embolism of bilateral middle cerebral arteries: Principal | ICD-10-CM | POA: Diagnosis present

## 2020-02-21 DIAGNOSIS — Z6836 Body mass index (BMI) 36.0-36.9, adult: Secondary | ICD-10-CM

## 2020-02-21 DIAGNOSIS — M797 Fibromyalgia: Secondary | ICD-10-CM | POA: Diagnosis present

## 2020-02-21 DIAGNOSIS — I6522 Occlusion and stenosis of left carotid artery: Secondary | ICD-10-CM | POA: Diagnosis present

## 2020-02-21 DIAGNOSIS — I517 Cardiomegaly: Secondary | ICD-10-CM | POA: Diagnosis not present

## 2020-02-21 DIAGNOSIS — I959 Hypotension, unspecified: Secondary | ICD-10-CM | POA: Diagnosis not present

## 2020-02-21 DIAGNOSIS — I10 Essential (primary) hypertension: Secondary | ICD-10-CM | POA: Diagnosis present

## 2020-02-21 DIAGNOSIS — R202 Paresthesia of skin: Secondary | ICD-10-CM | POA: Diagnosis not present

## 2020-02-21 DIAGNOSIS — R002 Palpitations: Secondary | ICD-10-CM | POA: Diagnosis present

## 2020-02-21 DIAGNOSIS — E785 Hyperlipidemia, unspecified: Secondary | ICD-10-CM | POA: Diagnosis present

## 2020-02-21 DIAGNOSIS — R0902 Hypoxemia: Secondary | ICD-10-CM | POA: Diagnosis not present

## 2020-02-21 DIAGNOSIS — Z20822 Contact with and (suspected) exposure to covid-19: Secondary | ICD-10-CM | POA: Diagnosis present

## 2020-02-21 DIAGNOSIS — Z800 Family history of malignant neoplasm of digestive organs: Secondary | ICD-10-CM

## 2020-02-21 DIAGNOSIS — I2510 Atherosclerotic heart disease of native coronary artery without angina pectoris: Secondary | ICD-10-CM | POA: Diagnosis present

## 2020-02-21 DIAGNOSIS — Z8249 Family history of ischemic heart disease and other diseases of the circulatory system: Secondary | ICD-10-CM

## 2020-02-21 DIAGNOSIS — Z7983 Long term (current) use of bisphosphonates: Secondary | ICD-10-CM

## 2020-02-21 DIAGNOSIS — I6389 Other cerebral infarction: Secondary | ICD-10-CM | POA: Diagnosis not present

## 2020-02-21 DIAGNOSIS — Z825 Family history of asthma and other chronic lower respiratory diseases: Secondary | ICD-10-CM

## 2020-02-21 DIAGNOSIS — R29810 Facial weakness: Secondary | ICD-10-CM | POA: Diagnosis not present

## 2020-02-21 DIAGNOSIS — I6523 Occlusion and stenosis of bilateral carotid arteries: Secondary | ICD-10-CM | POA: Diagnosis not present

## 2020-02-21 DIAGNOSIS — G459 Transient cerebral ischemic attack, unspecified: Secondary | ICD-10-CM | POA: Diagnosis present

## 2020-02-21 DIAGNOSIS — E7800 Pure hypercholesterolemia, unspecified: Secondary | ICD-10-CM | POA: Diagnosis not present

## 2020-02-21 DIAGNOSIS — F411 Generalized anxiety disorder: Secondary | ICD-10-CM | POA: Diagnosis present

## 2020-02-21 DIAGNOSIS — E876 Hypokalemia: Secondary | ICD-10-CM | POA: Diagnosis present

## 2020-02-21 DIAGNOSIS — Z882 Allergy status to sulfonamides status: Secondary | ICD-10-CM

## 2020-02-21 DIAGNOSIS — H538 Other visual disturbances: Secondary | ICD-10-CM | POA: Diagnosis present

## 2020-02-21 DIAGNOSIS — Z881 Allergy status to other antibiotic agents status: Secondary | ICD-10-CM

## 2020-02-21 DIAGNOSIS — R0989 Other specified symptoms and signs involving the circulatory and respiratory systems: Secondary | ICD-10-CM | POA: Diagnosis not present

## 2020-02-21 DIAGNOSIS — Z888 Allergy status to other drugs, medicaments and biological substances status: Secondary | ICD-10-CM

## 2020-02-21 DIAGNOSIS — G4489 Other headache syndrome: Secondary | ICD-10-CM | POA: Diagnosis not present

## 2020-02-21 DIAGNOSIS — Z83438 Family history of other disorder of lipoprotein metabolism and other lipidemia: Secondary | ICD-10-CM

## 2020-02-21 DIAGNOSIS — Z820 Family history of epilepsy and other diseases of the nervous system: Secondary | ICD-10-CM

## 2020-02-21 LAB — DIFFERENTIAL
Abs Immature Granulocytes: 0.03 10*3/uL (ref 0.00–0.07)
Basophils Absolute: 0.1 10*3/uL (ref 0.0–0.1)
Basophils Relative: 1 %
Eosinophils Absolute: 0.2 10*3/uL (ref 0.0–0.5)
Eosinophils Relative: 3 %
Immature Granulocytes: 0 %
Lymphocytes Relative: 31 %
Lymphs Abs: 3 10*3/uL (ref 0.7–4.0)
Monocytes Absolute: 0.7 10*3/uL (ref 0.1–1.0)
Monocytes Relative: 8 %
Neutro Abs: 5.4 10*3/uL (ref 1.7–7.7)
Neutrophils Relative %: 57 %

## 2020-02-21 LAB — CBC
HCT: 44.7 % (ref 36.0–46.0)
Hemoglobin: 14.3 g/dL (ref 12.0–15.0)
MCH: 28.9 pg (ref 26.0–34.0)
MCHC: 32 g/dL (ref 30.0–36.0)
MCV: 90.3 fL (ref 80.0–100.0)
Platelets: 238 10*3/uL (ref 150–400)
RBC: 4.95 MIL/uL (ref 3.87–5.11)
RDW: 12.6 % (ref 11.5–15.5)
WBC: 9.5 10*3/uL (ref 4.0–10.5)
nRBC: 0 % (ref 0.0–0.2)

## 2020-02-21 LAB — PROTIME-INR
INR: 1 (ref 0.8–1.2)
Prothrombin Time: 13 seconds (ref 11.4–15.2)

## 2020-02-21 LAB — I-STAT CHEM 8, ED
BUN: 7 mg/dL — ABNORMAL LOW (ref 8–23)
Calcium, Ion: 1.14 mmol/L — ABNORMAL LOW (ref 1.15–1.40)
Chloride: 99 mmol/L (ref 98–111)
Creatinine, Ser: 0.7 mg/dL (ref 0.44–1.00)
Glucose, Bld: 100 mg/dL — ABNORMAL HIGH (ref 70–99)
HCT: 44 % (ref 36.0–46.0)
Hemoglobin: 15 g/dL (ref 12.0–15.0)
Potassium: 3.6 mmol/L (ref 3.5–5.1)
Sodium: 143 mmol/L (ref 135–145)
TCO2: 28 mmol/L (ref 22–32)

## 2020-02-21 LAB — CBG MONITORING, ED: Glucose-Capillary: 105 mg/dL — ABNORMAL HIGH (ref 70–99)

## 2020-02-21 LAB — COMPREHENSIVE METABOLIC PANEL
ALT: 19 U/L (ref 0–44)
AST: 28 U/L (ref 15–41)
Albumin: 3.5 g/dL (ref 3.5–5.0)
Alkaline Phosphatase: 103 U/L (ref 38–126)
Anion gap: 11 (ref 5–15)
BUN: 7 mg/dL — ABNORMAL LOW (ref 8–23)
CO2: 28 mmol/L (ref 22–32)
Calcium: 9.1 mg/dL (ref 8.9–10.3)
Chloride: 100 mmol/L (ref 98–111)
Creatinine, Ser: 0.7 mg/dL (ref 0.44–1.00)
GFR calc Af Amer: >60 mL/min (ref >60)
GFR calc non Af Amer: >60 mL/min (ref >60)
Glucose, Bld: 103 mg/dL — ABNORMAL HIGH (ref 70–99)
Potassium: 3.5 mmol/L (ref 3.5–5.1)
Sodium: 139 mmol/L (ref 135–145)
Total Bilirubin: 0.5 mg/dL (ref 0.3–1.2)
Total Protein: 6.9 g/dL (ref 6.5–8.1)

## 2020-02-21 LAB — APTT: aPTT: 27 seconds (ref 24–36)

## 2020-02-21 MED ORDER — SODIUM CHLORIDE 0.9% FLUSH
3.0000 mL | Freq: Once | INTRAVENOUS | Status: AC
Start: 2020-02-21 — End: 2020-02-22
  Administered 2020-02-22: 3 mL via INTRAVENOUS

## 2020-02-21 NOTE — Consult Note (Signed)
Neurology Consultation  Reason for Consult: Blurred vision, left-sided numbness and tingling. Referring Physician: Dr. Geoffery Lyonsouglas Delo, EDP  CC: Blurred vision, left-sided numbness, tingling  History is obtained from: Chart, patient, son  HPI: Norma Scott is a 69 y.o. female past medical history of CAD was on antiplatelets at some time but not taking it anymore, tobacco abuse and COPD with current 1 to 1-1/2 pack smoker, fibromyalgia, hyperlipidemia, chronic back pain with back surgeries, right carotid stenosis status post CEA many years ago, presented to the emergency room for evaluation of blurred vision and left-sided tingling and numbness symptoms that happened today while driving. She reports that she was last normal around 7 PM when she was driving and had sudden onset of blurred vision.  She did not want to stop and wanted to get home.  She went home and also noted that her left side of the body was tingling and numb.  The symptoms lasted for about an hour or 2 and resolved.  Most of the blurred vision symptoms resolved in 30 minutes but the tingling numbness symptoms lasted longer. She was evaluated in the ED on arrival, neurological exam was unremarkable.  Stat MRI was done that showed scattered embolic strokes in the posterior circulation as well as left MCA PCA watershed pattern. Denies any current visual symptoms. Denies chest pain.  Reports off-and-on shortness of breath and off-and-on palpitations but no shortness of breath or palpitations right now. Has tried to quit smoking before, used Chantix for 6 months but then was taken off of it because she did not completely quit smoking on Chantix. Denies any nausea vomiting.  Denies diarrhea.  Denies any fevers or chills.  Reports having taken the Pfizer Covid vaccine a few months ago.   LKW: 7 PM on 02/21/2020 tpa given?: no, NIH 0, symptoms resolved at the time of arrival Premorbid modified Rankin scale (mRS): 1-due to chronic  back pain  ROS: Performed and negative except as noted in HPI.  Past Medical History:  Diagnosis Date  . Allergy    allergic rhinitis  . Arteriosclerotic coronary artery disease 05/21/2015  . Asthma   . CAD (coronary artery disease)    patient says no problems   dr Williemae Nattercandice smith pcp  . Carotid artery occlusion   . Cervical disc displacement    x2  . COPD (chronic obstructive pulmonary disease) ([facility])   . Esophageal reflux   . Essential hypertension 05/21/2015  . Family history of adverse reaction to anesthesia    mother hard to wake and affected b/p  . Fibromyalgia   . Gall stones   . Generalized anxiety disorder   . Hyperlipidemia   . Lumbar disc herniation    L5-S1  . Mini stroke ([facility])   . TIA (transient ischemic attack) 05/21/2015     Family History  Problem Relation Age of Onset  . Stroke Mother   . Hypertension Mother   . Hyperlipidemia Mother   . Seizures Mother        Grand mal seizures  . Emphysema Mother   . Diabetes Father   . Heart disease Father        Heart Disease before age 68  . Stroke Maternal Grandmother   . Heart Problems Maternal Grandmother   . Cancer Maternal Grandfather        stomach cancer  . Heart attack Maternal Grandfather   . Heart disease Paternal Grandfather   . Heart attack Paternal Grandfather   . Deep vein  thrombosis Brother   . Asthma Brother   . Cancer Paternal Grandmother 41  . Heart disease Paternal Uncle   . Heart attack Paternal Uncle     Social History:   reports that she has been smoking cigarettes. She has a 22.50 pack-year smoking history. She has never used smokeless tobacco. She reports current alcohol use. She reports that she does not use drugs. Smokes 1 to 1-1/2 packs cigarettes /day.  Denies illicit drug use.  Medications  Current Facility-Administered Medications:  .  sodium chloride flush (NS) 0.9 % injection 3 mL, 3 mL, Intravenous, Once, Geoffery Lyons, MD  Current Outpatient Medications:  .   albuterol (PROVENTIL HFA) 108 (90 Base) MCG/ACT inhaler, Inhale 1 puff into the lungs every 6 (six) hours as needed for wheezing or shortness of breath., Disp: 1 Inhaler, Rfl: 0 .  alendronate (FOSAMAX) 70 MG tablet, Take 70 mg by mouth once a week. Take on Mondays, Disp: , Rfl:  .  atorvastatin (LIPITOR) 40 MG tablet, Take 40 mg by mouth at bedtime. , Disp: , Rfl:  .  clonazePAM (KLONOPIN) 0.5 MG tablet, Take 1 mg by mouth every evening. , Disp: , Rfl:  .  cyclobenzaprine (FLEXERIL) 10 MG tablet, Take 0.5-1 tablets (5-10 mg total) by mouth 3 (three) times daily as needed for muscle spasms. (Patient taking differently: Take 5-10 mg by mouth 3 (three) times daily. ), Disp: 50 tablet, Rfl: 1 .  FLUoxetine (PROZAC) 20 MG capsule, Take 60 mg by mouth daily. , Disp: , Rfl:  .  furosemide (LASIX) 20 MG tablet, furosemide 20 mg tablet, Disp: , Rfl:  .  gabapentin (NEURONTIN) 300 MG capsule, Take 300-600 mg by mouth 3 (three) times daily. 300 mg twice daily. 600 mg at night, Disp: , Rfl:  .  lisinopril (ZESTRIL) 30 MG tablet, , Disp: , Rfl:  .  metoprolol tartrate (LOPRESSOR) 25 MG tablet, Take 25 mg by mouth 2 (two) times daily., Disp: , Rfl:  .  omeprazole (PRILOSEC) 40 MG capsule, , Disp: , Rfl:  .  Tdap (BOOSTRIX) 5-2.5-18.5 LF-MCG/0.5 injection, Boostrix Tdap 2.5 Lf unit-8 mcg-5 Lf/0.5 mL intramuscular syringe, Disp: , Rfl:   Exam: Current vital signs: BP (!) 168/63 (BP Location: Right Arm)   Pulse 60   Temp 97.8 F (36.6 C) (Oral)   Resp 17   SpO2 94%  Vital signs in last 24 hours: Temp:  [97.8 F (36.6 C)-98.3 F (36.8 C)] 97.8 F (36.6 C) (07/28 2250) Pulse Rate:  [60-74] 60 (07/28 2250) Resp:  [16-17] 17 (07/28 2250) BP: (148-168)/(63-80) 168/63 (07/28 2250) SpO2:  [93 %-94 %] 94 % (07/28 2250)  GENERAL: Awake, alert in NAD HEENT: - Normocephalic and atraumatic, dry mm, no LN++, no Thyromegally LUNGS - Clear to auscultation bilaterally with no wheezes CV - S1S2 RRR, no m/r/g, equal  pulses bilaterally. ABDOMEN - Soft, nontender, nondistended with normoactive BS Ext: warm, well perfused, intact peripheral pulses, no edema NEURO:  Mental Status: AA&Ox3  Language: speech is nondysarthric.  Naming, repetition, fluency, and comprehension intact. Cranial Nerves: PERRL. EOMI, visual fields full, no facial asymmetry, facial sensation intact, hearing intact, tongue/uvula/soft palate midline, normal sternocleidomastoid and trapezius muscle strength. No evidence of tongue atrophy or fibrillations Motor: Antigravity 5/5 both upper extremities.  Antigravity nearly 5/5 in both lower extremities with some limitation due to back pain causing difficulty in hip flexion bilaterally.  This is chronic. Tone: is normal and bulk is normal Sensation- Intact to light touch bilaterally Coordination:  FTN intact bilaterally Gait- deferred  NIHSS-0   Labs I have reviewed labs in epic and the results pertinent to this consultation are:  CBC    Component Value Date/Time   WBC 9.5 02/21/2020 2018   RBC 4.95 02/21/2020 2018   HGB 15.0 02/21/2020 2108   HCT 44.0 02/21/2020 2108   PLT 238 02/21/2020 2018   MCV 90.3 02/21/2020 2018   MCH 28.9 02/21/2020 2018   MCHC 32.0 02/21/2020 2018   RDW 12.6 02/21/2020 2018   LYMPHSABS 3.0 02/21/2020 2018   MONOABS 0.7 02/21/2020 2018   EOSABS 0.2 02/21/2020 2018   BASOSABS 0.1 02/21/2020 2018    CMP     Component Value Date/Time   NA 143 02/21/2020 2108   K 3.6 02/21/2020 2108   CL 99 02/21/2020 2108   CO2 28 02/21/2020 2018   GLUCOSE 100 (H) 02/21/2020 2108   BUN 7 (L) 02/21/2020 2108   CREATININE 0.70 02/21/2020 2108   CREATININE 0.73 05/31/2015 1420   CALCIUM 9.1 02/21/2020 2018   PROT 6.9 02/21/2020 2018   ALBUMIN 3.5 02/21/2020 2018   AST 28 02/21/2020 2018   ALT 19 02/21/2020 2018   ALKPHOS 103 02/21/2020 2018   BILITOT 0.5 02/21/2020 2018   GFRNONAA >60 02/21/2020 2018   GFRAA >60 02/21/2020 2018   Imaging I have reviewed the  images obtained:  CT-scan of the brain-official radiology read concerning for a subacute right MCA infarct.  MRI examination of the brain-the area concerning on the CT head is likely chronic encephalomalacia in the right MCA territory.  There are numerous embolic-looking infarcts in the posterior circulation bilaterally as well as in the left PCA/MCA watershed territory.  Assessment:  69 year old woman with extensive cerebrovascular and cardiovascular risk factor history not on any antiplatelets at this time, presenting for evaluation of symptoms of blurred vision and tingling and numbness on the left side that lasted from 30 minutes to 1 to 2 hours. Symptoms completely resolved at this time.  NIH 0. CT had some concerning findings which were followed by the MRI of the brain which showed a chronic encephalomalacia in the right MCA territory but numerous acute embolic-looking infarcts in the posterior circulation bilaterally as well as the left PCA/MCA territory watershed infarct. Atheroembolic versus cardioembolic phenomenology suspected. Not a candidate for TPA due to resolved symptoms Exam not suggestive of LVO.  Impression: Acute ischemic stroke-atheroembolic versus cardioembolic  Recommendations: Admit to hospitalist Frequent neurochecks CTA head and neck 2D echo, might need a TEE.  Will await 2D echo results prior to making any decisions. A1c Lipid panel Allow for permissive hypertension-treat only if systolic blood pressure is greater than 220 on a as needed basis.  After about 24 to 48 hours can start normalizing blood pressures with a goal blood pressure on discharge being less than 140/90. Currently not taking any antiplatelets-I would recommend starting on aspirin 325 and Plavix 75 at this time.  Duration of antiplatelet therapy based on vascular imaging.  We will update once imaging results become available after stroke team rounding. Atorvastatin 80-now and daily PT OT Speech  therapy N.p.o. until cleared by bedside swallow evaluation.  Counseled extensively, in the presence of her son about smoking cessation is being the biggest risk factor for strokes.  She says she has tried multiple times in failed but I have encouraged her to continue her efforts to quit smoking as that would significantly reduce her risk for future strokes.  She verbalized understanding and is willing  to try.  Would recommend outpatient primary care physician provide resources to continue to try to quit smoking.  Plan discussed with the ED PA-C, Merwyn Katos  Stroke team will continue to follow with you.  Please call with questions.  -- Milon Dikes, MD [facility] Pager: [phone] If 7pm to 7am, please call on call as listed on AMION.

## 2020-02-21 NOTE — ED Triage Notes (Signed)
Pt arrives to Ed via [facility] from home where she called once she got home due to while driving at 7pm she began to have blurry vision and left sided numbness and tingling that last about 30 minutes. Pt states this resolved on the way to ER. Pt arrives to ED alert and ox4 no numbness or weakness, speech clear. NIH 0 at this time. Verbal order from PA Ford placed stroke order set without calling stroke- pt will inform us if any symptoms return.

## 2020-02-21 NOTE — ED Provider Notes (Signed)
[facility]   CSN: [id number] Arrival date & time: 02/21/20  2005     History Chief Complaint  Patient presents with  . Numbness    Norma Scott is a 69 y.o. female.  69 y.o female with a PMH of HTN,CAD, prior TIA presents to the ED via EMS with a chief complaint of left arm weakness x 3 hours.  Patient reports she was driving home when she suddenly began to experience double vision, reports she had to close 1 eye in order to keep driving.  She also reports her left arm felt numb, felt like she was unable to grasp anything Along with tingling of her lips. Symptoms resolved after she arrived in the ED, she is currently asymptomatic at this time and has recovered function of her left arm.  He does have a prior history of TIA, she was previously placed on blood thinners, she is currently not taking any of these blood thinners.  She endorses a smoking history of more than half a pack daily.   The history is provided by the patient and medical records.       Past Medical History:  Diagnosis Date  . Allergy    allergic rhinitis  . Arteriosclerotic coronary artery disease 05/21/2015  . Asthma   . CAD (coronary artery disease)    patient says no problems   dr Williemae Nattercandice smith pcp  . Carotid artery occlusion   . Cervical disc displacement    x2  . COPD (chronic obstructive pulmonary disease) (HCC)   . Esophageal reflux   . Essential hypertension 05/21/2015  . Family history of adverse reaction to anesthesia    mother hard to wake and affected b/p  . Fibromyalgia   . Gall stones   . Generalized anxiety disorder   . Hyperlipidemia   . Lumbar disc herniation    L5-S1  . Mini stroke (HCC)   . TIA (transient ischemic attack) 05/21/2015    Patient Active Problem List   Diagnosis Date Noted  . COPD exacerbation (HCC) 07/03/2016  . Acute respiratory failure with hypoxia (HCC) 07/03/2016  . Hyponatremia 07/03/2016  . TIA  (transient ischemic attack) 05/21/2015  . Essential hypertension 05/21/2015  . Hyperlipidemia 05/21/2015  . Arteriosclerotic coronary artery disease 05/21/2015  . Aftercare following surgery of the circulatory system, NEC 01/22/2014  . Pain in limb-Bilateral shoulder Right > left 05/15/2013  . Occlusion and stenosis of carotid artery without mention of cerebral infarction 05/16/2012  . Peripheral vascular disease (HCC) 05/16/2012  . PAD (peripheral artery disease) (HCC) 02/08/2012    Past Surgical History:  Procedure Laterality Date  . BACK SURGERY  06/01/2012   lumbar  . CAROTID ENDARTERECTOMY  02/01/2009   Right  . CERVICAL FUSION     1995, 1996  . CHOLECYSTECTOMY    . CHOLECYSTECTOMY    . ECTOPIC PREGNANCY SURGERY  89  . ERCP N/A 11/20/2015   Procedure: ENDOSCOPIC RETROGRADE CHOLANGIOPANCREATOGRAPHY (ERCP);  Surgeon: Willis ModenaWilliam Outlaw, MD;  [facility];  Service: Endoscopy;  Laterality: N/A;  . EUS N/A 11/20/2015   Procedure: UPPER ENDOSCOPIC ULTRASOUND (EUS) RADIAL;  Surgeon: Willis ModenaWilliam Outlaw, MD;  Location: [facility];  Service: Endoscopy;  Laterality: N/A;  . TUBAL LIGATION       OB History   No obstetric history on file.     Family History  Problem Relation Age of Onset  . Stroke Mother   . Hypertension Mother   . Hyperlipidemia Mother   .  Seizures Mother        Grand mal seizures  . Emphysema Mother   . Diabetes Father   . Heart disease Father        Heart Disease before age 11  . Stroke Maternal Grandmother   . Heart Problems Maternal Grandmother   . Cancer Maternal Grandfather        stomach cancer  . Heart attack Maternal Grandfather   . Heart disease Paternal Grandfather   . Heart attack Paternal Grandfather   . Deep vein thrombosis Brother   . Asthma Brother   . Cancer Paternal Grandmother 65  . Heart disease Paternal Uncle   . Heart attack Paternal Uncle     Social History   Tobacco Use  . Smoking status: Current Some Day Smoker     Packs/day: 0.50    Years: 45.00    Pack years: 22.50    Types: Cigarettes  . Smokeless tobacco: Never Used  . Tobacco comment: states she only smokes about 1/2 pack  Substance Use Topics  . Alcohol use: Yes    Alcohol/week: 0.0 standard drinks    Comment: 2 times a year  . Drug use: No    Home Medications Prior to Admission medications   Medication Sig Start Date End Date Taking? Authorizing Provider  albuterol (PROVENTIL HFA) 108 (90 Base) MCG/ACT inhaler Inhale 1 puff into the lungs every 6 (six) hours as needed for wheezing or shortness of breath. 07/04/16   Alison Murray, MD  alendronate (FOSAMAX) 70 MG tablet Take 70 mg by mouth once a week. Take on Mondays 03/26/16   [provider]  atorvastatin (LIPITOR) 40 MG tablet Take 40 mg by mouth at bedtime.  05/02/15   [provider]  clonazePAM (KLONOPIN) 0.5 MG tablet Take 1 mg by mouth every evening.     [provider]  cyclobenzaprine (FLEXERIL) 10 MG tablet Take 0.5-1 tablets (5-10 mg total) by mouth 3 (three) times daily as needed for muscle spasms. Patient taking differently: Take 5-10 mg by mouth 3 (three) times daily.  06/03/12   Shirlean Kelly, MD  FLUoxetine (PROZAC) 20 MG capsule Take 60 mg by mouth daily.     [provider]  furosemide (LASIX) 20 MG tablet furosemide 20 mg tablet    [provider]  gabapentin (NEURONTIN) 300 MG capsule Take 300-600 mg by mouth 3 (three) times daily. 300 mg twice daily. 600 mg at night 04/30/15   [provider]  lisinopril (ZESTRIL) 30 MG tablet  11/06/19   [provider]  metoprolol tartrate (LOPRESSOR) 25 MG tablet Take 25 mg by mouth 2 (two) times daily. 04/30/15   [provider]  omeprazole (PRILOSEC) 40 MG capsule  10/12/19   [provider]  Tdap (BOOSTRIX) 5-2.5-18.5 LF-MCG/0.5 injection Boostrix Tdap 2.5 Lf unit-8 mcg-5 Lf/0.5 mL intramuscular syringe    [provider]    Allergies     Bupropion, Septra [sulfamethoxazole-trimethoprim], Sulfa antibiotics, Zithromax [azithromycin], and Cymbalta [duloxetine hcl]  Review of Systems   Review of Systems  Constitutional: Negative for chills and fever.  Eyes: Positive for visual disturbance (diplopia). Negative for photophobia.  Respiratory: Negative for shortness of breath.   Cardiovascular: Negative for chest pain.  Gastrointestinal: Negative for abdominal pain, nausea and vomiting.  Genitourinary: Negative for flank pain.  Musculoskeletal: Negative for back pain.  Neurological: Positive for weakness. Negative for dizziness, facial asymmetry, light-headedness and headaches.  All other systems reviewed and are negative.  Physical Exam Updated Vital Signs BP (!) 168/63 (BP Location: Right Arm)   Pulse 60   Temp 97.8 F (36.6 C) (Oral)   Resp 17   SpO2 94%   Physical Exam Vitals and nursing note reviewed.  Constitutional:      Appearance: Normal appearance.  HENT:     Head: Normocephalic and atraumatic.     Nose: Nose normal.     Mouth/Throat:     Mouth: Mucous membranes are moist.  Cardiovascular:     Rate and Rhythm: Normal rate.  Pulmonary:     Effort: Pulmonary effort is normal.     Breath sounds: Decreased breath sounds present. No wheezing or rales.     Comments: Lungs are diminished to auscultation.  Chest:     Chest wall: No tenderness.     Comments: No tenderness with palpation of the chest.  Abdominal:     General: Abdomen is flat.     Tenderness: There is no abdominal tenderness. There is no right CVA tenderness or left CVA tenderness.  Musculoskeletal:     Cervical back: Normal range of motion and neck supple.  Neurological:     Mental Status: She is alert and oriented to person, place, and time.     Comments: Alert, oriented, thought content appropriate. Speech fluent without evidence of aphasia. Able to follow 2 step commands without difficulty.  Cranial Nerves:  II:  Peripheral visual  fields grossly normal, pupils, round, reactive to light III,IV, VI: ptosis not present, extra-ocular motions intact bilaterally  V,VII: smile symmetric, facial light touch sensation equal VIII: hearing grossly normal bilaterally  IX,X: midline uvula rise  XI: bilateral shoulder shrug equal and strong XII: midline tongue extension  Motor:  5/5 in upper and lower extremities bilaterally including strong and equal grip strength and dorsiflexion/plantar flexion Sensory: light touch normal in all extremities.  Cerebellar: normal finger-to-nose with bilateral upper extremities, pronator drift negative       ED Results / Procedures / Treatments   Labs (all labs ordered are listed, but only abnormal results are displayed) Labs Reviewed  COMPREHENSIVE METABOLIC PANEL - Abnormal; Notable for the following components:      Result Value   Glucose, Bld 103 (*)    BUN 7 (*)    All other components within normal limits  I-STAT CHEM 8, ED - Abnormal; Notable for the following components:   BUN 7 (*)    Glucose, Bld 100 (*)    Calcium, Ion 1.14 (*)    All other components within normal limits  CBG MONITORING, ED - Abnormal; Notable for the following components:   Glucose-Capillary 105 (*)    All other components within normal limits  PROTIME-INR  APTT  CBC  DIFFERENTIAL    EKG None  Radiology DG Chest 2 View  Result Date: 02/21/2020 CLINICAL DATA:  Decreased breath sounds EXAM: CHEST - 2 VIEW COMPARISON:  07/03/2016 FINDINGS: Borderline cardiomegaly. No consolidation or effusion. Aortic atherosclerosis. No pneumothorax. IMPRESSION: No active cardiopulmonary disease. Borderline cardiomegaly. Electronically Signed   By: Jasmine Pang M.D.   On: 02/21/2020 23:32   CT HEAD WO CONTRAST  Result Date: 02/21/2020 CLINICAL DATA:  69 year old female with neurologic deficit. EXAM: CT HEAD WITHOUT CONTRAST TECHNIQUE: Contiguous axial images were obtained from the base of the skull through the  vertex without intravenous contrast. COMPARISON:  Head CT dated 07/17/2017. FINDINGS: Brain: Mild age-related atrophy and chronic microvascular ischemic changes. There is a moderate to large right MCA territory infarct,  likely subacute. An acute infarct is not excluded correlation with clinical exam and further evaluation with MRI is recommended. There is no acute intracranial hemorrhage. No mass effect or midline shift. No extra-axial fluid collection. Vascular: No hyperdense vessel or unexpected calcification. Skull: Normal. Negative for fracture or focal lesion. Sinuses/Orbits: No acute finding. Other: None IMPRESSION: 1. No acute intracranial hemorrhage. 2. 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 An acute infarct is not excluded. Further evaluation with MRI is recommended. 3. Mild age-related atrophy and chronic microvascular ischemic changes. These results were called by telephone at the time of interpretation on 02/21/2020 at 8:58 pm to provider Geoffery Lyons , who verbally acknowledged these results. Electronically Signed   By: Elgie Collard M.D.   On: 02/21/2020 21:04   MR BRAIN WO CONTRAST  Result Date: 02/21/2020 CLINICAL DATA:  69 year old female with neurologic deficit and suspected subacute right MCA infarct on plain head CT earlier tonight. EXAM: MRI HEAD WITHOUT CONTRAST TECHNIQUE: Multiplanar, multiecho pulse sequences of the brain and surrounding structures were obtained without intravenous contrast. COMPARISON:  Head CT 2047 hours today. FINDINGS: Brain: Corresponding to the CT abnormality there is abnormal T2 and FLAIR hyperintensity at the right frontal operculum, adjacent right insula. However, this is facilitated on ADC and isointense on trace diffusion. Mild associated hemosiderin on SWI. However, there are multiple punctate foci of cortical restricted diffusion scattered in the bilateral superior occipital lobes (series 5, image 77) and also in the posterior left  temporal lobe. No other restricted diffusion identified; the posterior fossa appear spared. No other cortical encephalomalacia identified but there are occasional white matter foci most suggestive of chronic lacunar infarcts (right parietal lobe series 11, image 16). There is moderate T2 and FLAIR hyperintensity in the pons. Possible tiny chronic lacunar infarct in the medial right thalamus. And possible tiny chronic infarct in the right cerebellum. No midline shift, mass effect, evidence of mass lesion, ventriculomegaly, extra-axial collection or acute intracranial hemorrhage. Cervicomedullary junction and pituitary are within normal limits. Vascular: Major intracranial vascular flow voids are preserved. Skull and upper cervical spine: Negative for age visible cervical spine. Visualized bone marrow signal is within normal limits. Sinuses/Orbits: Negative orbits. Paranasal sinuses and mastoids are stable and well pneumatized. Other: Visible internal auditory structures appear normal. Scalp and face soft tissues appear negative. IMPRESSION: 1. The right MCA territory infarct seen by CT tonight appears late subacute to early chronic by MRI. 2. However, there are scattered punctate acute infarcts in the bilateral occipital lobes (PCA territories) and posterior left temporal lobe (left MCA/PCA watershed). This pattern raises the possibility of a recent embolic event. 3. Mild hemosiderin associated with #1, but no acute hemorrhage or mass effect. 4. Otherwise mild to moderate for age underlying small vessel ischemia. Electronically Signed   By: Odessa Fleming M.D.   On: 02/21/2020 22:06    Procedures Procedures (including critical care time)  Medications Ordered in ED Medications  sodium chloride flush (NS) 0.9 % injection 3 mL (has no administration in time range)    ED Course  I have reviewed the triage vital signs and the nursing notes.  Pertinent labs & imaging results that were available during my care of the  patient were reviewed by me and considered in my medical decision making (see chart for details).    MDM Rules/Calculators/A&P   Patient with a PMH CAD, HTN, TIA presents to the ED via EMS with left arm numbness x 3 hours ago.  Patient began  to experience this while she had been driving down the highway, reports symptoms such as diplopia, left arm numbness have now resolved.  She feels that she is back to baseline at this time, although she did experience some lip tingling when this occurred.  Denies any headache at this time, shortness of breath, chest pain. Xray of her chest with mild cardiomegaly, no consolidation or pneumothorax.     During evaluation patient is neurologically intact, no deficits on my exam, no facial asymmetry, no dysarthria.  Interpretation of her labs revealed a CBC without any leukocytosis, reports no source of infection recently such as urinary, upper respiratory, fevers, coughs.  Hemoglobin is within her baseline.  CMP without any electrolyte abnormality, creatinine level is within normal limits.  LFTs are unremarkable.  CBG was 1 teens.  PT/INR are stable.  Patient was previously on blood thinners, reports she has " not taking those in a while ", reports she was previously placed on this due to a prior MI.  She is unsure which blood thinner she was previously placed on.  A CT of her head was ordered while patient arrived in triage, followed by MRI after brief evaluation by [facility] PA.  CT Head showed:  1. No acute intracranial hemorrhage.  2. 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  An acute infarct is not excluded. Further evaluation with MRI is  recommended.  3. Mild age-related atrophy and chronic microvascular ischemic  changes.    These results were called by telephone at the time of interpretation  on 02/21/2020 at 8:58 pm to provider Geoffery Lyons , who verbally  acknowledged these results.     MRI Brain showed: 1. The right MCA  territory infarct seen by CT tonight appears late subacute to early chronic by MRI.  2. However, there are scattered punctate acute infarcts in the bilateral occipital lobes (PCA territories) and posterior left temporal lobe (left MCA/PCA watershed). This pattern raises the possibility of a recent embolic event.  3. Mild hemosiderin associated with #1, but no acute hemorrhage or mass effect.  4. Otherwise mild to moderate for age underlying small vessel ischemia.  11:04 PM Spoke to Dr. Wilford Corner from neurology, who recommended admission with further workup for TIA and cardiac evaluation.   11:34 PM Spoke to Dr. Joneen Roach, hospitalist who will admit patient for further management and TIA workup.    Portions of this note were generated with Scientist, clinical (histocompatibility and immunogenetics). Dictation errors may occur despite best attempts at proofreading.  Final Clinical Impression(s) / ED Diagnoses Final diagnoses:  TIA (transient ischemic attack)  Left sided numbness    Rx / DC Orders ED Discharge Orders    None       Claude Manges, PA-C 02/21/20 2337    Geoffery Lyons, MD 02/22/20 1640

## 2020-02-21 NOTE — H&P (Signed)
PCP:   Merri Brunette, MD   Chief Complaint: Blurred vision   HPI:   This is a 69 year old female who around 730 this evening was driving home.  She developed some blurred vision and numbness in the left hand and lips.  She managed to drive home, her niece came out to help her to the house.  She fell and hit her ribs and a hip.  There is no loss of consciousness.  911 was called she was sent to the ER.  She had mild slurred speech, headache and some confusion.  She has some mild left upper extremity finger numbness.  While in the ER the symptoms resolved.  The hospitalist are requested to admit.  History provided by the patient and her son is present at bedside.  Review of Systems:  The patient denies anorexia, fever, weight loss,, vision loss, decreased hearing, hoarseness, chest pain, syncope, dyspnea on exertion, peripheral edema, balance deficits, hemoptysis, abdominal pain, melena, hematochezia, severe indigestion/heartburn, hematuria, incontinence, genital sores, muscle weakness, suspicious skin lesions, transient blindness, difficulty walking, depression, unusual weight change, abnormal bleeding, enlarged lymph nodes, angioedema, and breast masses.  Past Medical History: Past Medical History:  Diagnosis Date  . Allergy    allergic rhinitis  . Arteriosclerotic coronary artery disease 05/21/2015  . Asthma   . CAD (coronary artery disease)    patient says no problems   dr Williemae Natter smith pcp  . Carotid artery occlusion   . Cervical disc displacement    x2  . COPD (chronic obstructive pulmonary disease) (HCC)   . Esophageal reflux   . Essential hypertension 05/21/2015  . Family history of adverse reaction to anesthesia    mother hard to wake and affected b/p  . Fibromyalgia   . Gall stones   . Generalized anxiety disorder   . Hyperlipidemia   . Lumbar disc herniation    L5-S1  . Mini stroke (HCC)   . TIA (transient ischemic attack) 05/21/2015   Past Surgical History:   Procedure Laterality Date  . BACK SURGERY  06/01/2012   lumbar  . CAROTID ENDARTERECTOMY  02/01/2009   Right  . CERVICAL FUSION     1995, 1996  . CHOLECYSTECTOMY    . CHOLECYSTECTOMY    . ECTOPIC PREGNANCY SURGERY  93  . ERCP N/A 11/20/2015   Procedure: ENDOSCOPIC RETROGRADE CHOLANGIOPANCREATOGRAPHY (ERCP);  Surgeon: Willis Modena, MD;  Location: [facility];  Service: Endoscopy;  Laterality: N/A;  . EUS N/A 11/20/2015   Procedure: UPPER ENDOSCOPIC ULTRASOUND (EUS) RADIAL;  Surgeon: Willis Modena, MD;  Location: [facility];  Service: Endoscopy;  Laterality: N/A;  . TUBAL LIGATION      Medications: Prior to Admission medications   Medication Sig Start Date End Date Taking? Authorizing Provider  albuterol (PROVENTIL HFA) 108 (90 Base) MCG/ACT inhaler Inhale 1 puff into the lungs every 6 (six) hours as needed for wheezing or shortness of breath. 07/04/16   Alison Murray, MD  alendronate (FOSAMAX) 70 MG tablet Take 70 mg by mouth once a week. Take on Mondays 03/26/16   [provider]  atorvastatin (LIPITOR) 40 MG tablet Take 40 mg by mouth at bedtime.  05/02/15   [provider]  clonazePAM (KLONOPIN) 0.5 MG tablet Take 1 mg by mouth every evening.     [provider]  cyclobenzaprine (FLEXERIL) 10 MG tablet Take 0.5-1 tablets (5-10 mg total) by mouth 3 (three) times daily as needed for muscle spasms. Patient taking differently: Take 5-10 mg by mouth  3 (three) times daily.  06/03/12   Shirlean Kelly, MD  FLUoxetine (PROZAC) 20 MG capsule Take 60 mg by mouth daily.     [provider]  furosemide (LASIX) 20 MG tablet furosemide 20 mg tablet    [provider]  gabapentin (NEURONTIN) 300 MG capsule Take 300-600 mg by mouth 3 (three) times daily. 300 mg twice daily. 600 mg at night 04/30/15   [provider]  lisinopril (ZESTRIL) 30 MG tablet  11/06/19   [provider]  metoprolol tartrate (LOPRESSOR) 25 MG tablet Take 25 mg by  mouth 2 (two) times daily. 04/30/15   [provider]  omeprazole (PRILOSEC) 40 MG capsule  10/12/19   [provider]  Tdap (BOOSTRIX) 5-2.5-18.5 LF-MCG/0.5 injection Boostrix Tdap 2.5 Lf unit-8 mcg-5 Lf/0.5 mL intramuscular syringe    [provider]    Allergies:   Allergies  Allergen Reactions  . Bupropion Other (See Comments)    Hallucinations Other reaction(s): Hallucinations  . Septra [Sulfamethoxazole-Trimethoprim] Nausea And Vomiting    Vomiting  . Sulfa Antibiotics Nausea And Vomiting  . Zithromax [Azithromycin] Hives  . Cymbalta [Duloxetine Hcl] Other (See Comments)    fatigue    Social History:  reports that she has been smoking cigarettes. She has a 22.50 pack-year smoking history. She has never used smokeless tobacco. She reports current alcohol use. She reports that she does not use drugs.  Family History: Family History  Problem Relation Age of Onset  . Stroke Mother   . Hypertension Mother   . Hyperlipidemia Mother   . Seizures Mother        Grand mal seizures  . Emphysema Mother   . Diabetes Father   . Heart disease Father        Heart Disease before age 22  . Stroke Maternal Grandmother   . Heart Problems Maternal Grandmother   . Cancer Maternal Grandfather        stomach cancer  . Heart attack Maternal Grandfather   . Heart disease Paternal Grandfather   . Heart attack Paternal Grandfather   . Deep vein thrombosis Brother   . Asthma Brother   . Cancer Paternal Grandmother 47  . Heart disease Paternal Uncle   . Heart attack Paternal Uncle     Physical Exam: Vitals:   02/21/20 2025 02/21/20 2250  BP: (!) 148/80 (!) 168/63  Pulse: 74 60  Resp: 16 17  Temp: 98.3 F (36.8 C) 97.8 F (36.6 C)  TempSrc: Oral Oral  SpO2: 93% 94%    General:  Alert and oriented times three, well developed and nourished, no acute distress Eyes: PERRLA, pink conjunctiva, no scleral icterus ENT: Moist oral mucosa, neck supple, no  thyromegaly Lungs: clear to ascultation, no wheeze, no crackles, no use of accessory muscles Cardiovascular: regular rate and rhythm, no regurgitation, no gallops, no murmurs. No carotid bruits, no JVD Abdomen: soft, positive BS, non-tender, non-distended, no organomegaly, not an acute abdomen GU: not examined Neuro: CN II - XII grossly intact, sensation intact Musculoskeletal: strength 5/5 all extremities, no clubbing, cyanosis or edema Skin: no rash, no subcutaneous crepitation, no decubitus Psych: appropriate patient   Labs on Admission:  Recent Labs    02/21/20 2018 02/21/20 2108  NA 139 143  K 3.5 3.6  CL 100 99  CO2 28  --   GLUCOSE 103* 100*  BUN 7* 7*  CREATININE 0.70 0.70  CALCIUM 9.1  --    Recent Labs    02/21/20  2018  AST 28  ALT 19  ALKPHOS 103  BILITOT 0.5  PROT 6.9  ALBUMIN 3.5   No results for input(s): LIPASE, AMYLASE in the last 72 hours. Recent Labs    02/21/20 2018 02/21/20 2108  WBC 9.5  --   NEUTROABS 5.4  --   HGB 14.3 15.0  HCT 44.7 44.0  MCV 90.3  --   PLT 238  --    No results for input(s): CKTOTAL, CKMB, CKMBINDEX, TROPONINI in the last 72 hours. Invalid input(s): POCBNP No results for input(s): DDIMER in the last 72 hours. No results for input(s): HGBA1C in the last 72 hours. No results for input(s): CHOL, HDL, LDLCALC, TRIG, CHOLHDL, LDLDIRECT in the last 72 hours. No results for input(s): TSH, T4TOTAL, T3FREE, THYROIDAB in the last 72 hours.  Invalid input(s): FREET3 No results for input(s): VITAMINB12, FOLATE, FERRITIN, TIBC, IRON, RETICCTPCT in the last 72 hours.  Micro Results: No results found for this or any previous visit (from the past 240 hour(s)).   Radiological Exams on Admission: DG Chest 2 View  Result Date: 02/21/2020 CLINICAL DATA:  Decreased breath sounds EXAM: CHEST - 2 VIEW COMPARISON:  07/03/2016 FINDINGS: Borderline cardiomegaly. No consolidation or effusion. Aortic atherosclerosis. No pneumothorax.  IMPRESSION: No active cardiopulmonary disease. Borderline cardiomegaly. Electronically Signed   By: Jasmine Pang M.D.   On: 02/21/2020 23:32   CT HEAD WO CONTRAST  Result Date: 02/21/2020 CLINICAL DATA:  69 year old female with neurologic deficit. EXAM: CT HEAD WITHOUT CONTRAST TECHNIQUE: Contiguous axial images were obtained from the base of the skull through the vertex without intravenous contrast. COMPARISON:  Head CT dated 07/17/2017. FINDINGS: Brain: Mild age-related atrophy and chronic microvascular ischemic changes. There is a moderate to large right MCA territory infarct, likely subacute. An acute infarct is not excluded correlation with clinical exam and further evaluation with MRI is recommended. There is no acute intracranial hemorrhage. No mass effect or midline shift. No extra-axial fluid collection. Vascular: No hyperdense vessel or unexpected calcification. Skull: Normal. Negative for fracture or focal lesion. Sinuses/Orbits: No acute finding. Other: None IMPRESSION: 1. No acute intracranial hemorrhage. 2. 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 An acute infarct is not excluded. Further evaluation with MRI is recommended. 3. Mild age-related atrophy and chronic microvascular ischemic changes. These results were called by telephone at the time of interpretation on 02/21/2020 at 8:58 pm to provider Geoffery Lyons , who verbally acknowledged these results. Electronically Signed   By: Elgie Collard M.D.   On: 02/21/2020 21:04   MR BRAIN WO CONTRAST  Result Date: 02/21/2020 CLINICAL DATA:  69 year old female with neurologic deficit and suspected subacute right MCA infarct on plain head CT earlier tonight. EXAM: MRI HEAD WITHOUT CONTRAST TECHNIQUE: Multiplanar, multiecho pulse sequences of the brain and surrounding structures were obtained without intravenous contrast. COMPARISON:  Head CT 2047 hours today. FINDINGS: Brain: Corresponding to the CT abnormality there is  abnormal T2 and FLAIR hyperintensity at the right frontal operculum, adjacent right insula. However, this is facilitated on ADC and isointense on trace diffusion. Mild associated hemosiderin on SWI. However, there are multiple punctate foci of cortical restricted diffusion scattered in the bilateral superior occipital lobes (series 5, image 77) and also in the posterior left temporal lobe. No other restricted diffusion identified; the posterior fossa appear spared. No other cortical encephalomalacia identified but there are occasional white matter foci most suggestive of chronic lacunar infarcts (right parietal lobe series 11, image 16). There is moderate  T2 and FLAIR hyperintensity in the pons. Possible tiny chronic lacunar infarct in the medial right thalamus. And possible tiny chronic infarct in the right cerebellum. No midline shift, mass effect, evidence of mass lesion, ventriculomegaly, extra-axial collection or acute intracranial hemorrhage. Cervicomedullary junction and pituitary are within normal limits. Vascular: Major intracranial vascular flow voids are preserved. Skull and upper cervical spine: Negative for age visible cervical spine. Visualized bone marrow signal is within normal limits. Sinuses/Orbits: Negative orbits. Paranasal sinuses and mastoids are stable and well pneumatized. Other: Visible internal auditory structures appear normal. Scalp and face soft tissues appear negative. IMPRESSION: 1. The right MCA territory infarct seen by CT tonight appears late subacute to early chronic by MRI. 2. However, there are scattered punctate acute infarcts in the bilateral occipital lobes (PCA territories) and posterior left temporal lobe (left MCA/PCA watershed). This pattern raises the possibility of a recent embolic event. 3. Mild hemosiderin associated with #1, but no acute hemorrhage or mass effect. 4. Otherwise mild to moderate for age underlying small vessel ischemia. Electronically Signed   By: Odessa FlemingH   Hall M.D.   On: 02/21/2020 22:06    Assessment/Plan Present on Admission: . TIA (transient ischemic attack) -Admit to med telemetry -CVA order set initiated -MRI head, carotid ultrasound, 2D echo -Aspirin daily -Passive hypertension -Neuro consult obtained -PT/OT consult placed  . Essential hypertension -Stable, home meds resumed  . Hyperlipidemia -Stable, home meds resumed  . PAD (peripheral artery disease) (HCC) . Peripheral vascular disease (HCC) -  Sahand Gosch 02/21/2020, 11:45 PM

## 2020-02-22 ENCOUNTER — Other Ambulatory Visit: Payer: Self-pay

## 2020-02-22 ENCOUNTER — Observation Stay (HOSPITAL_BASED_OUTPATIENT_CLINIC_OR_DEPARTMENT_OTHER): Payer: Medicare HMO

## 2020-02-22 ENCOUNTER — Observation Stay (HOSPITAL_COMMUNITY): Payer: Medicare HMO

## 2020-02-22 ENCOUNTER — Encounter (HOSPITAL_COMMUNITY): Payer: Medicare HMO

## 2020-02-22 DIAGNOSIS — G8324 Monoplegia of upper limb affecting left nondominant side: Secondary | ICD-10-CM | POA: Diagnosis present

## 2020-02-22 DIAGNOSIS — I639 Cerebral infarction, unspecified: Secondary | ICD-10-CM | POA: Diagnosis not present

## 2020-02-22 DIAGNOSIS — I63413 Cerebral infarction due to embolism of bilateral middle cerebral arteries: Secondary | ICD-10-CM | POA: Diagnosis present

## 2020-02-22 DIAGNOSIS — G459 Transient cerebral ischemic attack, unspecified: Secondary | ICD-10-CM | POA: Diagnosis present

## 2020-02-22 DIAGNOSIS — F17210 Nicotine dependence, cigarettes, uncomplicated: Secondary | ICD-10-CM | POA: Diagnosis present

## 2020-02-22 DIAGNOSIS — I6522 Occlusion and stenosis of left carotid artery: Secondary | ICD-10-CM | POA: Diagnosis present

## 2020-02-22 DIAGNOSIS — I739 Peripheral vascular disease, unspecified: Secondary | ICD-10-CM | POA: Diagnosis present

## 2020-02-22 DIAGNOSIS — H538 Other visual disturbances: Secondary | ICD-10-CM | POA: Diagnosis present

## 2020-02-22 DIAGNOSIS — J449 Chronic obstructive pulmonary disease, unspecified: Secondary | ICD-10-CM | POA: Diagnosis present

## 2020-02-22 DIAGNOSIS — M810 Age-related osteoporosis without current pathological fracture: Secondary | ICD-10-CM | POA: Diagnosis not present

## 2020-02-22 DIAGNOSIS — I6523 Occlusion and stenosis of bilateral carotid arteries: Secondary | ICD-10-CM | POA: Diagnosis not present

## 2020-02-22 DIAGNOSIS — M797 Fibromyalgia: Secondary | ICD-10-CM | POA: Diagnosis present

## 2020-02-22 DIAGNOSIS — E7800 Pure hypercholesterolemia, unspecified: Secondary | ICD-10-CM | POA: Diagnosis not present

## 2020-02-22 DIAGNOSIS — Z8673 Personal history of transient ischemic attack (TIA), and cerebral infarction without residual deficits: Secondary | ICD-10-CM | POA: Diagnosis not present

## 2020-02-22 DIAGNOSIS — I6340 Cerebral infarction due to embolism of unspecified cerebral artery: Secondary | ICD-10-CM

## 2020-02-22 DIAGNOSIS — M549 Dorsalgia, unspecified: Secondary | ICD-10-CM | POA: Diagnosis present

## 2020-02-22 DIAGNOSIS — E669 Obesity, unspecified: Secondary | ICD-10-CM | POA: Diagnosis present

## 2020-02-22 DIAGNOSIS — R29700 NIHSS score 0: Secondary | ICD-10-CM | POA: Diagnosis present

## 2020-02-22 DIAGNOSIS — I10 Essential (primary) hypertension: Secondary | ICD-10-CM

## 2020-02-22 DIAGNOSIS — Z6836 Body mass index (BMI) 36.0-36.9, adult: Secondary | ICD-10-CM | POA: Diagnosis not present

## 2020-02-22 DIAGNOSIS — E785 Hyperlipidemia, unspecified: Secondary | ICD-10-CM | POA: Diagnosis present

## 2020-02-22 DIAGNOSIS — Z20822 Contact with and (suspected) exposure to covid-19: Secondary | ICD-10-CM | POA: Diagnosis present

## 2020-02-22 DIAGNOSIS — I6389 Other cerebral infarction: Secondary | ICD-10-CM | POA: Diagnosis not present

## 2020-02-22 DIAGNOSIS — R002 Palpitations: Secondary | ICD-10-CM

## 2020-02-22 DIAGNOSIS — F411 Generalized anxiety disorder: Secondary | ICD-10-CM | POA: Diagnosis present

## 2020-02-22 DIAGNOSIS — R200 Anesthesia of skin: Secondary | ICD-10-CM | POA: Insufficient documentation

## 2020-02-22 DIAGNOSIS — G9389 Other specified disorders of brain: Secondary | ICD-10-CM | POA: Diagnosis present

## 2020-02-22 DIAGNOSIS — G8929 Other chronic pain: Secondary | ICD-10-CM | POA: Diagnosis present

## 2020-02-22 DIAGNOSIS — I63433 Cerebral infarction due to embolism of bilateral posterior cerebral arteries: Secondary | ICD-10-CM | POA: Diagnosis present

## 2020-02-22 DIAGNOSIS — I2510 Atherosclerotic heart disease of native coronary artery without angina pectoris: Secondary | ICD-10-CM | POA: Diagnosis present

## 2020-02-22 DIAGNOSIS — R4781 Slurred speech: Secondary | ICD-10-CM | POA: Diagnosis present

## 2020-02-22 LAB — CBC
HCT: 40.6 % (ref 36.0–46.0)
HCT: 41.7 % (ref 36.0–46.0)
Hemoglobin: 12.4 g/dL (ref 12.0–15.0)
Hemoglobin: 13.4 g/dL (ref 12.0–15.0)
MCH: 28.4 pg (ref 26.0–34.0)
MCH: 28.6 pg (ref 26.0–34.0)
MCHC: 30.5 g/dL (ref 30.0–36.0)
MCHC: 32.1 g/dL (ref 30.0–36.0)
MCV: 88.9 fL (ref 80.0–100.0)
MCV: 92.9 fL (ref 80.0–100.0)
Platelets: 198 10*3/uL (ref 150–400)
Platelets: 209 10*3/uL (ref 150–400)
RBC: 4.37 MIL/uL (ref 3.87–5.11)
RBC: 4.69 MIL/uL (ref 3.87–5.11)
RDW: 12.6 % (ref 11.5–15.5)
RDW: 12.6 % (ref 11.5–15.5)
WBC: 9.7 10*3/uL (ref 4.0–10.5)
WBC: 9.9 10*3/uL (ref 4.0–10.5)
nRBC: 0 % (ref 0.0–0.2)
nRBC: 0 % (ref 0.0–0.2)

## 2020-02-22 LAB — COMPREHENSIVE METABOLIC PANEL
ALT: 19 U/L (ref 0–44)
AST: 26 U/L (ref 15–41)
Albumin: 3.3 g/dL — ABNORMAL LOW (ref 3.5–5.0)
Alkaline Phosphatase: 89 U/L (ref 38–126)
Anion gap: 11 (ref 5–15)
BUN: 7 mg/dL — ABNORMAL LOW (ref 8–23)
CO2: 26 mmol/L (ref 22–32)
Calcium: 8.8 mg/dL — ABNORMAL LOW (ref 8.9–10.3)
Chloride: 101 mmol/L (ref 98–111)
Creatinine, Ser: 0.71 mg/dL (ref 0.44–1.00)
GFR calc Af Amer: >60 mL/min (ref >60)
GFR calc non Af Amer: >60 mL/min (ref >60)
Glucose, Bld: 110 mg/dL — ABNORMAL HIGH (ref 70–99)
Potassium: 3.3 mmol/L — ABNORMAL LOW (ref 3.5–5.1)
Sodium: 138 mmol/L (ref 135–145)
Total Bilirubin: 0.6 mg/dL (ref 0.3–1.2)
Total Protein: 6.6 g/dL (ref 6.5–8.1)

## 2020-02-22 LAB — ECHOCARDIOGRAM COMPLETE
AR max vel: 2.01 cm2
AV Area VTI: 2.01 cm2
AV Area mean vel: 2.04 cm2
AV Mean grad: 4 mmHg
AV Peak grad: 8.5 mmHg
Ao pk vel: 1.46 m/s
Area-P 1/2: 2.48 cm2
S' Lateral: 3 cm

## 2020-02-22 LAB — CREATININE, SERUM
Creatinine, Ser: 0.68 mg/dL (ref 0.44–1.00)
GFR calc Af Amer: >60 mL/min (ref >60)
GFR calc non Af Amer: >60 mL/min (ref >60)

## 2020-02-22 LAB — HEMOGLOBIN A1C
Hgb A1c MFr Bld: 6 % — ABNORMAL HIGH (ref 4.8–5.6)
Mean Plasma Glucose: 125.5 mg/dL

## 2020-02-22 LAB — RAPID URINE DRUG SCREEN, HOSP PERFORMED
Amphetamines: NOT DETECTED
Barbiturates: NOT DETECTED
Benzodiazepines: NOT DETECTED
Cocaine: NOT DETECTED
Opiates: NOT DETECTED
Tetrahydrocannabinol: NOT DETECTED

## 2020-02-22 LAB — LIPID PANEL
Cholesterol: 131 mg/dL (ref 0–200)
HDL: 42 mg/dL (ref >40)
LDL Cholesterol: 70 mg/dL (ref 0–99)
Total CHOL/HDL Ratio: 3.1 RATIO
Triglycerides: 96 mg/dL (ref <150)
VLDL: 19 mg/dL (ref 0–40)

## 2020-02-22 LAB — SARS CORONAVIRUS 2 BY RT PCR (HOSPITAL ORDER, PERFORMED IN ~~LOC~~ HOSPITAL LAB): SARS Coronavirus 2: NEGATIVE

## 2020-02-22 LAB — HIV ANTIBODY (ROUTINE TESTING W REFLEX): HIV Screen 4th Generation wRfx: NONREACTIVE

## 2020-02-22 MED ORDER — FUROSEMIDE 20 MG PO TABS
20.0000 mg | ORAL_TABLET | ORAL | Status: DC
  Administered 2020-02-22: 20 mg via ORAL
  Filled 2020-02-22: qty 1

## 2020-02-22 MED ORDER — FLUOXETINE HCL 20 MG PO CAPS
60.0000 mg | ORAL_CAPSULE | Freq: Every day | ORAL | Status: DC
  Administered 2020-02-22 – 2020-02-23 (×2): 60 mg via ORAL
  Filled 2020-02-22 (×3): qty 3

## 2020-02-22 MED ORDER — IOHEXOL 350 MG/ML SOLN
75.0000 mL | Freq: Once | INTRAVENOUS | Status: AC | PRN
  Administered 2020-02-22: 75 mL via INTRAVENOUS

## 2020-02-22 MED ORDER — SENNOSIDES-DOCUSATE SODIUM 8.6-50 MG PO TABS: 1.0000 | ORAL_TABLET | Freq: Every evening | ORAL | Status: DC | PRN

## 2020-02-22 MED ORDER — ENOXAPARIN SODIUM 40 MG/0.4ML ~~LOC~~ SOLN
40.0000 mg | Freq: Every day | SUBCUTANEOUS | Status: DC
  Administered 2020-02-22 – 2020-02-23 (×2): 40 mg via SUBCUTANEOUS
  Filled 2020-02-22 (×2): qty 0.4

## 2020-02-22 MED ORDER — POTASSIUM CHLORIDE CRYS ER 20 MEQ PO TBCR
40.0000 meq | EXTENDED_RELEASE_TABLET | Freq: Once | ORAL | Status: AC
  Administered 2020-02-22: 40 meq via ORAL
  Filled 2020-02-22: qty 2

## 2020-02-22 MED ORDER — ACETAMINOPHEN 650 MG RE SUPP: 650.0000 mg | RECTAL | Status: DC | PRN

## 2020-02-22 MED ORDER — GABAPENTIN 300 MG PO CAPS
300.0000 mg | ORAL_CAPSULE | Freq: Three times a day (TID) | ORAL | Status: DC
  Administered 2020-02-22: 300 mg via ORAL
  Filled 2020-02-22: qty 1

## 2020-02-22 MED ORDER — GABAPENTIN 300 MG PO CAPS
300.0000 mg | ORAL_CAPSULE | Freq: Two times a day (BID) | ORAL | Status: DC
  Administered 2020-02-23 (×2): 300 mg via ORAL
  Filled 2020-02-22 (×2): qty 1

## 2020-02-22 MED ORDER — ACETAMINOPHEN 160 MG/5ML PO SOLN: 650.0000 mg | ORAL | Status: DC | PRN

## 2020-02-22 MED ORDER — ACETAMINOPHEN 325 MG PO TABS: 650.0000 mg | ORAL_TABLET | ORAL | Status: DC | PRN

## 2020-02-22 MED ORDER — NICOTINE 21 MG/24HR TD PT24
21.0000 mg | MEDICATED_PATCH | Freq: Every day | TRANSDERMAL | Status: DC
  Administered 2020-02-22 – 2020-02-23 (×2): 21 mg via TRANSDERMAL
  Filled 2020-02-22 (×2): qty 1

## 2020-02-22 MED ORDER — ASPIRIN 325 MG PO TABS
325.0000 mg | ORAL_TABLET | Freq: Every day | ORAL | Status: DC
  Administered 2020-02-22: 325 mg via ORAL
  Filled 2020-02-22: qty 1

## 2020-02-22 MED ORDER — LISINOPRIL 20 MG PO TABS: 30.0000 mg | ORAL_TABLET | Freq: Every day | ORAL | Status: DC

## 2020-02-22 MED ORDER — ASPIRIN EC 81 MG PO TBEC
81.0000 mg | DELAYED_RELEASE_TABLET | Freq: Every day | ORAL | Status: DC
  Administered 2020-02-23: 81 mg via ORAL
  Filled 2020-02-22: qty 1

## 2020-02-22 MED ORDER — ATORVASTATIN CALCIUM 80 MG PO TABS
80.0000 mg | ORAL_TABLET | Freq: Every day | ORAL | Status: DC
  Administered 2020-02-22: 80 mg via ORAL
  Filled 2020-02-22: qty 1

## 2020-02-22 MED ORDER — CLOPIDOGREL BISULFATE 75 MG PO TABS
75.0000 mg | ORAL_TABLET | Freq: Every day | ORAL | Status: DC
  Administered 2020-02-22 – 2020-02-23 (×2): 75 mg via ORAL
  Filled 2020-02-22 (×2): qty 1

## 2020-02-22 MED ORDER — FLUTICASONE-UMECLIDIN-VILANT 100-62.5-25 MCG/INH IN AEPB: 1.0000 | INHALATION_SPRAY | Freq: Every day | RESPIRATORY_TRACT | Status: DC

## 2020-02-22 MED ORDER — GABAPENTIN 300 MG PO CAPS
600.0000 mg | ORAL_CAPSULE | Freq: Every day | ORAL | Status: DC
  Administered 2020-02-22: 600 mg via ORAL
  Filled 2020-02-22: qty 2

## 2020-02-22 MED ORDER — METOPROLOL TARTRATE 25 MG PO TABS
25.0000 mg | ORAL_TABLET | Freq: Two times a day (BID) | ORAL | Status: DC
  Administered 2020-02-22 – 2020-02-23 (×3): 25 mg via ORAL
  Filled 2020-02-22 (×3): qty 1

## 2020-02-22 MED ORDER — PANTOPRAZOLE SODIUM 40 MG PO TBEC
40.0000 mg | DELAYED_RELEASE_TABLET | Freq: Every day | ORAL | Status: DC
  Administered 2020-02-22 – 2020-02-23 (×2): 40 mg via ORAL
  Filled 2020-02-22 (×2): qty 1

## 2020-02-22 MED ORDER — FLUTICASONE FUROATE-VILANTEROL 100-25 MCG/INH IN AEPB
1.0000 | INHALATION_SPRAY | Freq: Every day | RESPIRATORY_TRACT | Status: DC
  Administered 2020-02-22: 22:00:00 1 via RESPIRATORY_TRACT
  Filled 2020-02-22 (×2): qty 28

## 2020-02-22 MED ORDER — STROKE: EARLY STAGES OF RECOVERY BOOK
Freq: Once | Status: AC
  Filled 2020-02-22: qty 1

## 2020-02-22 MED ORDER — UMECLIDINIUM BROMIDE 62.5 MCG/INH IN AEPB
1.0000 | INHALATION_SPRAY | Freq: Every day | RESPIRATORY_TRACT | Status: DC
  Administered 2020-02-22: 1 via RESPIRATORY_TRACT
  Filled 2020-02-22 (×2): qty 7

## 2020-02-22 NOTE — ED Notes (Signed)
Pt returned from CT °

## 2020-02-22 NOTE — Progress Notes (Signed)
Occupational Therapy Evaluation  PTA, Pt independent with ADL and IADL tasks, including driving and watching her 32 & 68 year old grand children. Pt sustained a fall prior to coming to the ED. Pt reports having intermittent Double vision and dizziness for "years", but her vision and dizziness is "worse" since this "episode".  Experienced double vision while driving and states she was unable to use her cell phone to call her son.  Increased complaints of dizziness with head movement and reports having a history of "vertigo". Pt states syptoms are better, howeer not at baseline. Feel pt will benefit from outpt OT at a [facility] outpt Center to maximize functional level of independence with IADL tasks due to deficits listed below in addition to experiencing activity limitations from COPD. Recommend pt follow up with an eye doctor to further assess vision and refrian from driving at this time.. Pt states "it's been years since I've gone to the eye doctor"    02/22/20 1058  OT Visit Information  Last OT Received On 02/22/20  Assistance Needed +1  PT/OT/SLP Co-Evaluation/Treatment  (partial session observing pt mobilize with PT)  History of Present Illness 69 y.o. female past medical history of CAD was on antiplatelets at some time but not taking it anymore, tobacco abuse and COPD with current 1 to 1-1/2 pack smoker, fibromyalgia, hyperlipidemia, chronic back pain with back surgeries, right carotid stenosis status post CEA many years ago, presented to the emergency room for evaluation of blurred vision and left-sided tingling and numbness symptoms . CT had some concerning findings which were followed by the MRI of the brain which showed a chronic encephalomalacia in the right MCA territory but numerous acute embolic-looking infarcts in the posterior circulation bilaterally as well as the left PCA/MCA territory watershed infarct.  Precautions  Precautions Fall  Home Living  Family/patient expects to be discharged  to: Private residence  Living Arrangements Children (son and gf around to help. helps w meds.)  Available Help at Discharge Family  Type of Home House  Home Access Other (comment) (no stairs)  Home Layout One level  Bathroom Shower/Tub Walk-in shower (shower chair)  Horticulturist, commercial Yes  How Accessible Accessible via walker  Home Equipment Cane - quad;Cane - single point;Walker - 2 wheels;Grab bars - tub/shower  Prior Function  Level of Independence Independent  Comments does not use AD; drives; watches her grand children  Communication  Communication No difficulties  Pain Assessment  Pain Assessment Faces  Faces Pain Scale 4  Pain Location back  Pain Descriptors / Indicators Discomfort;Aching  Pain Intervention(s) Limited activity within patient's tolerance  Cognition  Arousal/Alertness Awake/alert  Behavior During Therapy WFL for tasks assessed/performed  Overall Cognitive Status Within Functional Limits for tasks assessed  General Comments Assessed with the Short blessed Test of memory and concentration. Pt received a score of 0 which places pt inthe "normal cognition" category. Son states that he has not noticed any difficulty with memory or confusion until this episode. Pt verbalizes difficulty using her phone because she couldn't figure how to get phone numbers off her phone. Pt reports tis has resolved - son concurs.   Upper Extremity Assessment  Upper Extremity Assessment Overall WFL for tasks assessed  Lower Extremity Assessment  Lower Extremity Assessment Defer to PT evaluation  Cervical / Trunk Assessment  Cervical / Trunk Assessment Other exceptions (hx back problems; pt reports fall 2 months ago )  Cervical / Trunk Exceptions and sustained a pelvic fracture  ADL  Overall ADL's  Needs assistance/impaired  Grooming Set up;Sitting  Upper Body Bathing Set up;Sitting  Lower Body Bathing Supervison/ safety;Sit to/from stand  Upper Body  Dressing  Set up;Sitting  Lower Body Dressing Supervision/safety;Sit to/from Scientist, research (life sciences) Ambulation;Minimal assistance  Toileting- Horticulturist, commercial;Sit to/from stand  Functional mobility during ADLs Minimal assistance  General ADL Comments Pt unsteady; fall prior to admission  Vision- History  Baseline Vision/History Wears glasses  Wears Glasses Reading only  Patient Visual Report Blurring of vision;Diplopia  Vision- Assessment  Vision Assessment? Yes  Eye Alignment WFL  Ocular Range of Motion [facility]  Alignment/Gaze Preference WDL  Tracking/Visual Pursuits Decreased smoothness of horizontal tracking;Decreased smoothness of vertical tracking  Saccades Additional head turns occurred during testing  Convergence Impaired (comment)  Diplopia Assessment  (occasional double vision)  Additional Comments not reporting double vision at this time  Perception  Comments appears [facility]  Praxis  Praxis tested? WFL  Bed Mobility  Overal bed mobility Needs Assistance  Bed Mobility Supine to Sit;Sit to Supine  Supine to sit Supervision  Sit to supine Supervision  Transfers  Overall transfer level Needs assistance  Transfers Sit to/from Stand;Stand Pivot Transfers  Sit to Stand Min assist  Stand pivot transfers Min assist  General transfer comment unsteady at times  Balance  Overall balance assessment History of Falls;Needs assistance  Sitting balance-Leahy Scale Good  Standing balance-Leahy Scale Poor  General Comments  General comments (skin integrity, edema, etc.) VSS  OT - End of Session  Equipment Utilized During Treatment Gait belt  Activity Tolerance Patient tolerated treatment well  Patient left in bed;with call bell/phone within reach;with family/visitor present  Nurse Communication Mobility status  OT Assessment  OT Recommendation/Assessment Patient needs continued OT Services  OT Visit Diagnosis Unsteadiness on feet (R26.81);Muscle  weakness (generalized) (M62.81);History of falling (Z91.81);Other abnormalities of gait and mobility (R26.89);Pain;Dizziness and giddiness (R42)  Pain - part of body  (back)  OT Problem List Decreased strength;Decreased activity tolerance;Impaired balance (sitting and/or standing);Impaired vision/perception;Decreased knowledge of use of DME or AE;Decreased safety awareness;Cardiopulmonary status limiting activity;Obesity;Pain  OT Plan  OT Frequency (ACUTE ONLY) Min 2X/week  OT Treatment/Interventions (ACUTE ONLY) Self-care/ADL training;Therapeutic exercise;Neuromuscular education;Energy conservation;DME and/or AE instruction;Therapeutic activities;Patient/family education;Balance training  AM-PAC OT "6 Clicks" Daily Activity Outcome Measure (Version 2)  Help from another person eating meals? 4  Help from another person taking care of personal grooming? 3  Help from another person toileting, which includes using toliet, bedpan, or urinal? 3  Help from another person bathing (including washing, rinsing, drying)? 3  Help from another person to put on and taking off regular upper body clothing? 3  Help from another person to put on and taking off regular lower body clothing? 3  6 Click Score 19  OT Recommendation  Follow Up Recommendations Outpatient OT;Supervision/Assistance - 24 hour (S with mobility and ADL initially) Refrain from driving  Individuals Consulted  Consulted and Agree with Results and Recommendations Patient;Family member/caregiver  Family Member Consulted son  Acute Rehab OT Goals  Patient Stated Goal to be ableto do more and walk 2 miles a day  OT Goal Formulation With patient  Time For Goal Achievement 03/07/20  Potential to Achieve Goals Good  OT Time Calculation  OT Start Time (ACUTE ONLY) 1100  OT Stop Time (ACUTE ONLY) 1125  OT Time Calculation (min) 25 min  OT General Charges  $OT Visit 1 Visit  OT Evaluation  $OT Eval Low Complexity 1 Low  Written Expression   Dominant Hand Right  Luisa Dago, OT/L   Acute OT Clinical Specialist Acute Rehabilitation Services Pager [phone] Office [phone]

## 2020-02-22 NOTE — Progress Notes (Signed)
PROGRESS NOTE  Norma Scott KZL:[id number] DOB: [birth year] DOA: 02/21/2020 PCP: Merri Brunette, MD   LOS: 0 days   Brief narrative: As per HPI,  Patient is a 69 year old female with past medical history of PAD, essential hypertension, hyperlipidemia, COPD, CVA, presented to the hospital with acute onset blurred vision and numbness in the left hand and lips.  She managed to drive home, her niece came out to help her to the house.  She fell and hit her ribs and a hip.  There was no loss of consciousness.    Subsequently, 911 was called she was sent to the ER.  She had mild slurred speech, headache and some confusion.  She has some mild left upper extremity finger numbness.  While in the ER the symptoms improved.  Patient was then admitted to the hospital.  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 MRI shows bilateral MCA and PCA scattered punctate acute infarcts likely embolic in nature.  CTA head and neck with no large vessel occlusion.  Left ICA with 50% stenosis.  Lower extremity Doppler is still pending.  2D echocardiogram showed left ventricular ejection fraction of 60 to 65%.  Neurology on board.  Recommend dual antiplatelets for 3 weeks then aspirin alone.  Continue PT OT evaluation.  Appears that patient might need a loop recorder placement prior to discharge.  Hemoglobin A1c of 6.0.  She does have history of intermittent palpitations.  Physical therapy seen the patient and recommend outpatient physical therapy with rolling walker on discharge.  Hypokalemia.  Will replace orally.  Check levels in a.m.  Carotid stenosis.  Left 50% stenosis.  History of right carotid endarterectomy.  Will need outpatient  follow-up.  Essential hypertension Permissive hypertension for now. Follow closely.  Hyperlipidemia On lipitor 80 mg daily.  Continue on discharge.  peripheral artery disease On lipitor  Tobacco use disorder and history of copd. Counseled. Add nicotine patch. No wheezes. Continue inhalers, was on supplemental oxygen at home in the past.  DVT prophylaxis: enoxaparin (LOVENOX) injection 40 mg Start: 02/22/20 1000 SCD's Start: 02/22/20 0028   Code Status: Full code  Family Communication: none  Status is: Inpatient  Remains inpatient appropriate because:Inpatient level of care appropriate due to severity of illness and Stroke work-up, might need loop recorder.   Dispo: The patient is from: Home              Anticipated d/c is to: Home              Anticipated d/c date is: 1 day              Patient currently is not medically stable to d/c.  Consultants:  Neurology  Procedures:  None  Antibiotics:  . None  Anti-infectives (From admission, onward)   None     Subjective: Today, patient was seen and examined at bedside. Denies blurriness or numbness.  Objective: Vitals:   02/22/20 1437 02/22/20 1530  BP:  (!) 147/68  Pulse:  71  Resp:  19  Temp:  97.8 F (36.6 C)  SpO2: 96% 95%   No intake or output data in the 24 hours ending 02/22/20 1531 There were no vitals filed for this visit. There is no height or weight on file to calculate  BMI.   Physical Exam: GENERAL: Patient is alert awake and oriented. Not in obvious distress. Looks older than stated age.on oxygen by [place] HENT: No scleral pallor or icterus. Pupils equally reactive to light. Oral mucosa is moist NECK: is supple, no gross swelling noted. CHEST: Clear to auscultation. No crackles or wheezes.  Diminished breath sounds bilaterally. CVS: S1 and S2 heard, no murmur. Regular rate and rhythm.  ABDOMEN: Soft, non-tender, bowel sounds are present. EXTREMITIES: No edema. CNS: Cranial nerves are  intact. No focal motor deficits. SKIN: warm and dry without rashes.  Data Review: I have personally reviewed the following laboratory data and studies,  CBC: Recent Labs  Lab 02/21/20 2018 02/21/20 2108 02/22/20 0033 02/22/20 0317  WBC 9.5  --  9.7 9.9  NEUTROABS 5.4  --   --   --   HGB 14.3 15.0 12.4 13.4  HCT 44.7 44.0 40.6 41.7  MCV 90.3  --  92.9 88.9  PLT 238  --  198 209   Basic Metabolic Panel: Recent Labs  Lab 02/21/20 2018 02/21/20 2108 02/22/20 0033 02/22/20 0317  NA 139 143  --  138  K 3.5 3.6  --  3.3*  CL 100 99  --  101  CO2 28  --   --  26  GLUCOSE 103* 100*  --  110*  BUN 7* 7*  --  7*  CREATININE 0.70 0.70 0.68 0.71  CALCIUM 9.1  --   --  8.8*   Liver Function Tests: Recent Labs  Lab 02/21/20 2018 02/22/20 0317  AST 28 26  ALT 19 19  ALKPHOS 103 89  BILITOT 0.5 0.6  PROT 6.9 6.6  ALBUMIN 3.5 3.3*   No results for input(s): LIPASE, AMYLASE in the last 168 hours. No results for input(s): AMMONIA in the last 168 hours. Cardiac Enzymes: No results for input(s): CKTOTAL, CKMB, CKMBINDEX, TROPONINI in the last 168 hours. BNP (last 3 results) No results for input(s): BNP in the last 8760 hours.  ProBNP (last 3 results) No results for input(s): PROBNP in the last 8760 hours.  CBG: Recent Labs  Lab 02/21/20 2242  GLUCAP 105*   Recent Results (from the past 240 hour(s))  SARS Coronavirus 2 by RT PCR (hospital order, performed in [facility] hospital lab) Nasopharyngeal Nasopharyngeal Swab     Status: None   Collection Time: 02/22/20  8:47 AM   Specimen: Nasopharyngeal Swab  Result Value Ref Range Status   SARS Coronavirus 2 NEGATIVE NEGATIVE Final    Comment: (NOTE) SARS-CoV-2 target nucleic acids are NOT DETECTED.  The SARS-CoV-2 RNA is generally detectable in upper and lower respiratory specimens during the acute phase of infection. The lowest concentration of SARS-CoV-2 viral copies this assay can detect is 250 copies / mL. A  negative result does not preclude SARS-CoV-2 infection and should not be used as the sole basis for treatment or other patient management decisions.  A negative result may occur with improper specimen collection / handling, submission of specimen other than nasopharyngeal swab, presence of viral mutation(s) within the areas targeted by this assay, and inadequate number of viral copies (<250 copies / mL). A negative result must be combined with clinical observations, patient history, and epidemiological information.  Fact Sheet for Patients:   BoilerBrush.com.cy  Fact Sheet for Healthcare Providers: https://pope.com/  This test is not yet approved or  cleared by the [place] FDA and has been authorized for detection and/or diagnosis of SARS-CoV-2 by FDA under an  Emergency Use Authorization (EUA).  This EUA will remain in effect (meaning this test can be used) for the duration of the COVID-19 declaration under Section 564(b)(1) of the Act, 21 U.S.C. section 360bbb-3(b)(1), unless the authorization is terminated or revoked sooner.  Performed at [facility], [address] [place], [place] [address]      Studies: CT ANGIO HEAD W OR WO CONTRAST  Result Date: 02/22/2020 CLINICAL DATA:  Episode of left hand and lips numbness and tingling, visual disturbance EXAM: CT ANGIOGRAPHY HEAD AND NECK TECHNIQUE: Multidetector CT imaging of the head and neck was performed using the standard protocol during bolus administration of intravenous contrast. Multiplanar CT image reconstructions and MIPs were obtained to evaluate the vascular anatomy. Carotid stenosis measurements (when applicable) are obtained utilizing NASCET criteria, using the distal internal carotid diameter as the denominator. CONTRAST:  73mL OMNIPAQUE IOHEXOL 350 MG/ML SOLN COMPARISON:  Recent CT and MRI brain, MRA neck 2016 FINDINGS: CT HEAD Brain: There is no acute  intracranial hemorrhage, mass effect, or edema. No new loss of gray-white differentiation. Tiny infarcts on prior MRI are not visible. Chronic right MCA territory infarct involving frontal lobe and insula. Mild ex vacuo dilatation of the adjacent right lateral ventricle. There is no extra-axial fluid collection. Ventricles are stable in size. Vascular: There is atherosclerotic calcification at the skull base. Skull: Calvarium is unremarkable. Sinuses/Orbits: No acute finding. Other: None. Review of the MIP images confirms the above findings CTA NECK Aortic arch: Mild calcified plaque at the patent great vessel origins. Right carotid system: Common carotid is patent with multifocal mild calcified plaque. Internal and external carotid arteries are patent. There is no measurable stenosis at the ICA origin. Left carotid system: Common carotid is patent with mild calcified plaque distally. Internal and external carotid arteries are patent. Heavily calcified plaque at the ICA origin limits stenosis measurement due to artifact but there is approximately 50% stenosis. Vertebral arteries: Patent.  Left vertebral artery is dominant. Skeleton: Multilevel degenerative changes of the cervical spine. Other neck: No mass or adenopathy. Upper chest: Emphysema. Review of the MIP images confirms the above findings CTA HEAD Anterior circulation: Intracranial internal carotid arteries are patent with calcified plaque causing mild stenosis. Anterior cerebral arteries are patent. Middle cerebral arteries are patent. Posterior circulation: Intracranial vertebral arteries patent with mild calcified plaque on the left. Basilar artery is patent. Posterior cerebral arteries are patent. Venous sinuses: Patent as allowed by contrast bolus timing. Review of the MIP images confirms the above findings IMPRESSION: No acute intracranial hemorrhage. Small infarcts on MRI are not visible. No large vessel occlusion. Calcified plaque at the left ICA  origin causes approximately 50% stenosis. Electronically Signed   By: Guadlupe Spanish M.D.   On: 02/22/2020 12:55   DG Chest 2 View  Result Date: 02/21/2020 CLINICAL DATA:  Decreased breath sounds EXAM: CHEST - 2 VIEW COMPARISON:  07/03/2016 FINDINGS: Borderline cardiomegaly. No consolidation or effusion. Aortic atherosclerosis. No pneumothorax. IMPRESSION: No active cardiopulmonary disease. Borderline cardiomegaly. Electronically Signed   By: Jasmine Pang M.D.   On: 02/21/2020 23:32   CT HEAD WO CONTRAST  Result Date: 02/21/2020 CLINICAL DATA:  69 year old female with neurologic deficit. EXAM: CT HEAD WITHOUT CONTRAST TECHNIQUE: Contiguous axial images were obtained from the base of the skull through the vertex without intravenous contrast. COMPARISON:  Head CT dated 07/17/2017. FINDINGS: Brain: Mild age-related atrophy and chronic microvascular ischemic changes. There is a moderate to large right MCA territory infarct, likely subacute. An  acute infarct is not excluded correlation with clinical exam and further evaluation with MRI is recommended. There is no acute intracranial hemorrhage. No mass effect or midline shift. No extra-axial fluid collection. Vascular: No hyperdense vessel or unexpected calcification. Skull: Normal. Negative for fracture or focal lesion. Sinuses/Orbits: No acute finding. Other: None IMPRESSION: 1. No acute intracranial hemorrhage. 2. 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 An acute infarct is not excluded. Further evaluation with MRI is recommended. 3. Mild age-related atrophy and chronic microvascular ischemic changes. These results were called by telephone at the time of interpretation on 02/21/2020 at 8:58 pm to provider Geoffery Lyons , who verbally acknowledged these results. Electronically Signed   By: Elgie Collard M.D.   On: 02/21/2020 21:04   CT ANGIO NECK W OR WO CONTRAST  Result Date: 02/22/2020 CLINICAL DATA:  Episode of left hand and lips  numbness and tingling, visual disturbance EXAM: CT ANGIOGRAPHY HEAD AND NECK TECHNIQUE: Multidetector CT imaging of the head and neck was performed using the standard protocol during bolus administration of intravenous contrast. Multiplanar CT image reconstructions and MIPs were obtained to evaluate the vascular anatomy. Carotid stenosis measurements (when applicable) are obtained utilizing NASCET criteria, using the distal internal carotid diameter as the denominator. CONTRAST:  75mL OMNIPAQUE IOHEXOL 350 MG/ML SOLN COMPARISON:  Recent CT and MRI brain, MRA neck 2016 FINDINGS: CT HEAD Brain: There is no acute intracranial hemorrhage, mass effect, or edema. No new loss of gray-white differentiation. Tiny infarcts on prior MRI are not visible. Chronic right MCA territory infarct involving frontal lobe and insula. Mild ex vacuo dilatation of the adjacent right lateral ventricle. There is no extra-axial fluid collection. Ventricles are stable in size. Vascular: There is atherosclerotic calcification at the skull base. Skull: Calvarium is unremarkable. Sinuses/Orbits: No acute finding. Other: None. Review of the MIP images confirms the above findings CTA NECK Aortic arch: Mild calcified plaque at the patent great vessel origins. Right carotid system: Common carotid is patent with multifocal mild calcified plaque. Internal and external carotid arteries are patent. There is no measurable stenosis at the ICA origin. Left carotid system: Common carotid is patent with mild calcified plaque distally. Internal and external carotid arteries are patent. Heavily calcified plaque at the ICA origin limits stenosis measurement due to artifact but there is approximately 50% stenosis. Vertebral arteries: Patent.  Left vertebral artery is dominant. Skeleton: Multilevel degenerative changes of the cervical spine. Other neck: No mass or adenopathy. Upper chest: Emphysema. Review of the MIP images confirms the above findings CTA HEAD  Anterior circulation: Intracranial internal carotid arteries are patent with calcified plaque causing mild stenosis. Anterior cerebral arteries are patent. Middle cerebral arteries are patent. Posterior circulation: Intracranial vertebral arteries patent with mild calcified plaque on the left. Basilar artery is patent. Posterior cerebral arteries are patent. Venous sinuses: Patent as allowed by contrast bolus timing. Review of the MIP images confirms the above findings IMPRESSION: No acute intracranial hemorrhage. Small infarcts on MRI are not visible. No large vessel occlusion. Calcified plaque at the left ICA origin causes approximately 50% stenosis. Electronically Signed   By: Guadlupe Spanish M.D.   On: 02/22/2020 12:55   MR BRAIN WO CONTRAST  Result Date: 02/21/2020 CLINICAL DATA:  69 year old female with neurologic deficit and suspected subacute right MCA infarct on plain head CT earlier tonight. EXAM: MRI HEAD WITHOUT CONTRAST TECHNIQUE: Multiplanar, multiecho pulse sequences of the brain and surrounding structures were obtained without intravenous contrast. COMPARISON:  Head CT 2047  hours today. FINDINGS: Brain: Corresponding to the CT abnormality there is abnormal T2 and FLAIR hyperintensity at the right frontal operculum, adjacent right insula. However, this is facilitated on ADC and isointense on trace diffusion. Mild associated hemosiderin on SWI. However, there are multiple punctate foci of cortical restricted diffusion scattered in the bilateral superior occipital lobes (series 5, image 77) and also in the posterior left temporal lobe. No other restricted diffusion identified; the posterior fossa appear spared. No other cortical encephalomalacia identified but there are occasional white matter foci most suggestive of chronic lacunar infarcts (right parietal lobe series 11, image 16). There is moderate T2 and FLAIR hyperintensity in the pons. Possible tiny chronic lacunar infarct in the medial right  thalamus. And possible tiny chronic infarct in the right cerebellum. No midline shift, mass effect, evidence of mass lesion, ventriculomegaly, extra-axial collection or acute intracranial hemorrhage. Cervicomedullary junction and pituitary are within normal limits. Vascular: Major intracranial vascular flow voids are preserved. Skull and upper cervical spine: Negative for age visible cervical spine. Visualized bone marrow signal is within normal limits. Sinuses/Orbits: Negative orbits. Paranasal sinuses and mastoids are stable and well pneumatized. Other: Visible internal auditory structures appear normal. Scalp and face soft tissues appear negative. IMPRESSION: 1. The right MCA territory infarct seen by CT tonight appears late subacute to early chronic by MRI. 2. However, there are scattered punctate acute infarcts in the bilateral occipital lobes (PCA territories) and posterior left temporal lobe (left MCA/PCA watershed). This pattern raises the possibility of a recent embolic event. 3. Mild hemosiderin associated with #1, but no acute hemorrhage or mass effect. 4. Otherwise mild to moderate for age underlying small vessel ischemia. Electronically Signed   By: Odessa Fleming M.D.   On: 02/21/2020 22:06   ECHOCARDIOGRAM COMPLETE  Result Date: 02/22/2020    ECHOCARDIOGRAM REPORT   Patient Name:   ALIEYAH SPADER [facility] Date of Exam: 02/22/2020 Medical Rec #:  [id number]              Height:       60.0 in Accession #:    [id number]             Weight:       187.4 lb Date of Birth:  16-May-1951              BSA:          1.816 m Patient Age:    68 years               BP:           112/45 mmHg Patient Gender: F                      HR:           64 bpm. Exam Location:  Inpatient Procedure: 2D Echo, Cardiac Doppler and Color Doppler Indications:    TIA  History:        Patient has prior history of Echocardiogram examinations, most                 recent 01/19/2015. COPD; Risk Factors:Current Smoker,                  Hypertension and Dyslipidemia.  Sonographer:    Ross Ludwig RDCS (AE) Referring Phys: [phone] DEBBY CROSLEY  Sonographer Comments: Patient is morbidly obese and suboptimal parasternal window. IMPRESSIONS  1. Left ventricular ejection fraction, by estimation, is 60 to 65%. The left ventricle has  normal function. The left ventricle has no regional wall motion abnormalities. There is moderate left ventricular hypertrophy. Left ventricular diastolic parameters are indeterminate.  2. Right ventricular systolic function is normal. The right ventricular size is normal. Tricuspid regurgitation signal is inadequate for assessing PA pressure.  3. The mitral valve is grossly normal. No evidence of mitral valve regurgitation.  4. The aortic valve was not well visualized. Aortic valve regurgitation is not visualized. Mild to moderate aortic valve sclerosis/calcification is present, without any evidence of aortic stenosis.  5. The inferior vena cava is normal in size with greater than 50% respiratory variability, suggesting right atrial pressure of 3 mmHg. FINDINGS  Left Ventricle: Left ventricular ejection fraction, by estimation, is 60 to 65%. The left ventricle has normal function. The left ventricle has no regional wall motion abnormalities. The left ventricular internal cavity size was normal in size. There is  moderate left ventricular hypertrophy. Left ventricular diastolic parameters are indeterminate. Right Ventricle: The right ventricular size is normal. Right vetricular wall thickness was not assessed. Right ventricular systolic function is normal. Tricuspid regurgitation signal is inadequate for assessing PA pressure. Left Atrium: Left atrial size was normal in size. Right Atrium: Right atrial size was normal in size. Pericardium: Trivial pericardial effusion is present. Presence of pericardial fat pad. Mitral Valve: The mitral valve is grossly normal. No evidence of mitral valve regurgitation. MV peak gradient, 3.8 mmHg.  The mean mitral valve gradient is 1.0 mmHg. Tricuspid Valve: The tricuspid valve is grossly normal. Tricuspid valve regurgitation is not demonstrated. Aortic Valve: The aortic valve was not well visualized. Aortic valve regurgitation is not visualized. Mild to moderate aortic valve sclerosis/calcification is present, without any evidence of aortic stenosis. Aortic valve mean gradient measures 4.0 mmHg.  Aortic valve peak gradient measures 8.5 mmHg. Aortic valve area, by VTI measures 2.01 cm. Pulmonic Valve: The pulmonic valve was not well visualized. Pulmonic valve regurgitation is not visualized. Aorta: The aortic root is normal in size and structure. Venous: The inferior vena cava is normal in size with greater than 50% respiratory variability, suggesting right atrial pressure of 3 mmHg. IAS/Shunts: The interatrial septum was not well visualized.  LEFT VENTRICLE PLAX 2D LVIDd:         4.40 cm  Diastology LVIDs:         3.00 cm  LV e' lateral:   11.30 cm/s LV PW:         1.50 cm  LV E/e' lateral: 6.8 LV IVS:        1.40 cm  LV e' medial:    5.00 cm/s LVOT diam:     2.00 cm  LV E/e' medial:  15.3 LV SV:         66 LV SV Index:   37 LVOT Area:     3.14 cm  RIGHT VENTRICLE             IVC RV Basal diam:  3.00 cm     IVC diam: 1.40 cm RV S prime:     13.20 cm/s TAPSE (M-mode): 2.0 cm LEFT ATRIUM             Index       RIGHT ATRIUM           Index LA diam:        3.30 cm 1.82 cm/m  RA Area:     13.70 cm LA Vol (A2C):   42.0 ml 23.13 ml/m RA Volume:   29.90 ml  16.47 ml/m LA Vol (A4C):   32.6 ml 17.95 ml/m LA Biplane Vol: 37.4 ml 20.60 ml/m  AORTIC VALVE AV Area (Vmax):    2.01 cm AV Area (Vmean):   2.04 cm AV Area (VTI):     2.01 cm AV Vmax:           146.00 cm/s AV Vmean:          96.600 cm/s AV VTI:            0.329 m AV Peak Grad:      8.5 mmHg AV Mean Grad:      4.0 mmHg LVOT Vmax:         93.30 cm/s LVOT Vmean:        62.600 cm/s LVOT VTI:          0.211 m LVOT/AV VTI ratio: 0.64  AORTA Ao Root diam:  3.20 cm MITRAL VALVE MV Area (PHT): 2.48 cm    SHUNTS MV Peak grad:  3.8 mmHg    Systemic VTI:  0.21 m MV Mean grad:  1.0 mmHg    Systemic Diam: 2.00 cm MV Vmax:       0.97 m/s MV Vmean:      52.1 cm/s MV Decel Time: 306 msec MV E velocity: 76.30 cm/s MV A velocity: 91.70 cm/s MV E/A ratio:  0.83 Epifanio Lescheshristopher Schumann MD Electronically signed by Epifanio Lescheshristopher Schumann MD Signature Date/Time: 02/22/2020/10:33:19 AM    Final       Joycelyn DasLaxman Braulio Kiedrowski, MD  [facility] 02/22/2020

## 2020-02-22 NOTE — ED Notes (Signed)
Pt transported to CT ?

## 2020-02-22 NOTE — ED Notes (Signed)
RN to room, introduced self , blood work was collected, pt was eating food brought by family, pt denied any needs at this time, RN will continue to monitor

## 2020-02-22 NOTE — Evaluation (Addendum)
Physical Therapy Evaluation Patient Details Name: Norma Scott MRN: [id number] DOB: 11-Jul-1951 Today's Date: 02/22/2020   History of Present Illness  Pt is a 69 y.o F presenting to the ED post-TIA of the L MCA/PCA distribution with an associated fall on her ribs and hip. Pt reported n/t of her L hand, slurred speech, headache, and confusion. She has a history of diplopia, dysphagia, falls, numbness/tingling, and dizziness episodes. PMH of CAD, fibromyalgia, COPD, lymbar disk herniation of L5-S1, TIA, carotid artery occlusion, lyperlipidemia, and gall stones.  Clinical Impression   Patient was received laying supine in bed, son present during evaluation. Stated she has good support at home as her son and two grandchildren live in the unit above her and are present if she needs help. PLOF has been fully independent with no use of AD. Global MMT WFL. Light touch sensation intact over UE's and LE's bilaterally. CN Screening (-), Dysdiadochokinesia (-) some deficits with Dysmetria present. Reported dizziness with VOR stimulation. See below for assistance levels in bed mobility and sit <> stand transfer. Pt ambulated 60' with MinAx1 with no use of AD on 2L O2. SpO2 and HR remained WNL throughout session while on O2. Pt and family were agreeable to f/u with Neurological Outpatient Physical Therapy. Consider vestibular screening next session. She was left laying in bed with OT finishing evaluation.    Follow Up Recommendations Other (comment) (Neurological Specialty Outpatient Physical Therapy)    Equipment Recommendations  Rolling walker with 5" wheels    Recommendations for Other Services       Precautions / Restrictions Precautions Precautions: Fall Restrictions Weight Bearing Restrictions: No      Mobility  Bed Mobility Overal bed mobility: Independent             General bed mobility comments: supine <> sit EOB with no difficulties or assistance  required  Transfers Overall transfer level: Needs assistance Equipment used: None Transfers: Sit to/from Stand Sit to Stand: Min assist         General transfer comment: Required MinA to stand from EOB  Ambulation/Gait Ambulation/Gait assistance: Min assist Gait Distance (Feet): 60 Feet Assistive device: None Gait Pattern/deviations: Trunk flexed;Narrow base of support;Decreased step length - right;Decreased step length - left Gait velocity: decreased   General Gait Details: Patient required MinAx1 when ambulating 60' in hallway. Decreased gait speed. SpO2 levels remained over 95% with 2L O2.  Stairs            Wheelchair Mobility    Modified Rankin (Stroke Patients Only)       Balance Overall balance assessment: Needs assistance Sitting-balance support: No upper extremity supported;Feet unsupported Sitting balance-Leahy Scale: Good Sitting balance - Comments: No difficulties maintaining static sitting at EOB   Standing balance support: No upper extremity supported;During functional activity Standing balance-Leahy Scale: Fair Standing balance comment: Static standing balance without use of UE's was [facility], however needed MinAx1 with gait belt when ambulating.                             Pertinent Vitals/Pain Pain Assessment: Faces Faces Pain Scale: Hurts a little bit Pain Location: low back and pelvis Pain Descriptors / Indicators: Dull;Discomfort Pain Intervention(s): Monitored during session    Home Living Family/patient expects to be discharged to:: (P) Private residence Living Arrangements: (P) Children (son and gf around to help. helps w meds.) Available Help at Discharge: (P) Family Type of Home: (P) House Home Access: (  P) Other (comment) (no stairs)     Home Layout: (P) One level Home Equipment: (P) Cane - quad;Cane - single point;Walker - 2 wheels      Prior Function Level of Independence: (P) Independent         Comments: (P)  does not use AD     Hand Dominance   Dominant Hand: (P) Right    Extremity/Trunk Assessment   Upper Extremity Assessment Upper Extremity Assessment: Defer to OT evaluation    Lower Extremity Assessment Lower Extremity Assessment: Overall WFL for tasks assessed    Cervical / Trunk Assessment Cervical / Trunk Assessment: Kyphotic  Communication   Communication: (P) No difficulties  Cognition Arousal/Alertness: Awake/alert Behavior During Therapy: WFL for tasks assessed/performed;Flat affect Overall Cognitive Status: Within Functional Limits for tasks assessed                                 General Comments: slight difficulty with short-term recall of remembering a name, address, and city.      General Comments      Exercises     Assessment/Plan    PT Assessment Patient needs continued PT services  PT Problem List Decreased activity tolerance;Decreased safety awareness;Decreased balance;Decreased cognition       PT Treatment Interventions Balance training;Gait training;Therapeutic exercise;Therapeutic activities;Patient/family education;DME instruction    PT Goals (Current goals can be found in the Care Plan section)  Acute Rehab PT Goals Patient Stated Goal: "go home" and "play with her 2 grandchildren" PT Goal Formulation: With patient Time For Goal Achievement: 03/07/20 Potential to Achieve Goals: Good    Frequency Min 3X/week   Barriers to discharge        Co-evaluation PT/OT/SLP Co-Evaluation/Treatment: Yes Reason for Co-Treatment: Complexity of the patient's impairments (multi-system involvement) PT goals addressed during session: Mobility/safety with mobility;Balance         AM-PAC PT "6 Clicks" Mobility  Outcome Measure Help needed turning from your back to your side while in a flat bed without using bedrails?: None Help needed moving from lying on your back to sitting on the side of a flat bed without using bedrails?: None Help  needed moving to and from a bed to a chair (including a wheelchair)?: A Little Help needed standing up from a chair using your arms (e.g., wheelchair or bedside chair)?: A Little Help needed to walk in hospital room?: A Little Help needed climbing 3-5 steps with a railing? : A Little 6 Click Score: 20    End of Session Equipment Utilized During Treatment: Gait belt;Oxygen (2L O2) Activity Tolerance: Patient tolerated treatment well Patient left: in bed;with family/visitor present;Other (comment) (OT present to finish eval.)   PT Visit Diagnosis: History of falling (Z91.81);Dizziness and giddiness (R42);Unsteadiness on feet (R26.81);Other symptoms and signs involving the nervous system (R29.898)    Time:  -      Charges:              Elisha Ponder, SPT, ATC

## 2020-02-22 NOTE — ED Notes (Signed)
PT eval in progress 

## 2020-02-22 NOTE — ED Notes (Signed)
Tele  Breakfast Ordered 

## 2020-02-22 NOTE — ED Notes (Signed)
Lunch Tray Ordered @ 1008. 

## 2020-02-22 NOTE — Progress Notes (Signed)
  Echocardiogram 2D Echocardiogram has been performed.  Gerda Diss 02/22/2020, 8:33 AM

## 2020-02-22 NOTE — Progress Notes (Signed)
STROKE TEAM PROGRESS NOTE   INTERVAL HISTORY Her RN ans son are at the bedside.  Pt lying in bed, no complains. She and her son stated that she had several episodes of blurry vision, double vision, confusion and lip numbness in the past, lasting 2-3 min and resolved. She was told to have TIAs but no stroke work up done. Yesterday, she was driving, and she had sudden onset dizziness, blurry vision, double vision, confusion and lip numbness but lasted longer about 30min before resolved. She also had HA and left arm numbness after that, which was new to her. Now all symptoms resolved now. MRI showed b/l MCA, MCA/PCA punctate infarcts, concerning for cardioembolic source. Pt stated that she has intermittent heart palpitation.   Vitals:   02/21/20 2250 02/22/20 0030 02/22/20 0031 02/22/20 0728  BP: (!) 168/63 (!) 112/54  (!) 112/45  Pulse: 60 60 59 64  Resp: 17 19 18 18   Temp: 97.8 F (36.6 C)     TempSrc: Oral     SpO2: 94% 93% 97% 94%   CBC:  Recent Labs  Lab 02/21/20 2018 02/21/20 2108 02/22/20 0033 02/22/20 0317  WBC 9.5   < > 9.7 9.9  NEUTROABS 5.4  --   --   --   HGB 14.3   < > 12.4 13.4  HCT 44.7   < > 40.6 41.7  MCV 90.3   < > 92.9 88.9  PLT 238   < > 198 209   < > = values in this interval not displayed.   Basic Metabolic Panel:  Recent Labs  Lab 02/21/20 2018 02/21/20 2018 02/21/20 2108 02/21/20 2108 02/22/20 0033 02/22/20 0317  NA 139   < > 143  --   --  138  K 3.5   < > 3.6  --   --  3.3*  CL 100   < > 99  --   --  101  CO2 28  --   --   --   --  26  GLUCOSE 103*   < > 100*  --   --  110*  BUN 7*   < > 7*  --   --  7*  CREATININE 0.70   < > 0.70   < > 0.68 0.71  CALCIUM 9.1  --   --   --   --  8.8*   < > = values in this interval not displayed.   Lipid Panel:  Recent Labs  Lab 02/22/20 0317  CHOL 131  TRIG 96  HDL 42  CHOLHDL 3.1  VLDL 19  LDLCALC 70   HgbA1c:  Recent Labs  Lab 02/22/20 0044  HGBA1C 6.0*   Urine Drug Screen: No results for  input(s): LABOPIA, COCAINSCRNUR, LABBENZ, AMPHETMU, THCU, LABBARB in the last 168 hours.  Alcohol Level No results for input(s): ETH in the last 168 hours.  IMAGING past 24 hours DG Chest 2 View  Result Date: 02/21/2020 CLINICAL DATA:  Decreased breath sounds EXAM: CHEST - 2 VIEW COMPARISON:  07/03/2016 FINDINGS: Borderline cardiomegaly. No consolidation or effusion. Aortic atherosclerosis. No pneumothorax. IMPRESSION: No active cardiopulmonary disease. Borderline cardiomegaly. Electronically Signed   By: Jasmine PangKim  Fujinaga M.D.   On: 02/21/2020 23:32   CT HEAD WO CONTRAST  Result Date: 02/21/2020 CLINICAL DATA:  69 year old female with neurologic deficit. EXAM: CT HEAD WITHOUT CONTRAST TECHNIQUE: Contiguous axial images were obtained from the base of the skull through the vertex without intravenous contrast. COMPARISON:  Head CT dated  07/17/2017. FINDINGS: Brain: Mild age-related atrophy and chronic microvascular ischemic changes. There is a moderate to large right MCA territory infarct, likely subacute. An acute infarct is not excluded correlation with clinical exam and further evaluation with MRI is recommended. There is no acute intracranial hemorrhage. No mass effect or midline shift. No extra-axial fluid collection. Vascular: No hyperdense vessel or unexpected calcification. Skull: Normal. Negative for fracture or focal lesion. Sinuses/Orbits: No acute finding. Other: None IMPRESSION: 1. No acute intracranial hemorrhage. 2. 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 An acute infarct is not excluded. Further evaluation with MRI is recommended. 3. Mild age-related atrophy and chronic microvascular ischemic changes. These results were called by telephone at the time of interpretation on 02/21/2020 at 8:58 pm to provider Norma Scott , who verbally acknowledged these results. Electronically Signed   By: Elgie Collard M.D.   On: 02/21/2020 21:04   MR BRAIN WO CONTRAST  Result Date:  02/21/2020 CLINICAL DATA:  69 year old female with neurologic deficit and suspected subacute right MCA infarct on plain head CT earlier tonight. EXAM: MRI HEAD WITHOUT CONTRAST TECHNIQUE: Multiplanar, multiecho pulse sequences of the brain and surrounding structures were obtained without intravenous contrast. COMPARISON:  Head CT 2047 hours today. FINDINGS: Brain: Corresponding to the CT abnormality there is abnormal T2 and FLAIR hyperintensity at the right frontal operculum, adjacent right insula. However, this is facilitated on ADC and isointense on trace diffusion. Mild associated hemosiderin on SWI. However, there are multiple punctate foci of cortical restricted diffusion scattered in the bilateral superior occipital lobes (series 5, image 77) and also in the posterior left temporal lobe. No other restricted diffusion identified; the posterior fossa appear spared. No other cortical encephalomalacia identified but there are occasional white matter foci most suggestive of chronic lacunar infarcts (right parietal lobe series 11, image 16). There is moderate T2 and FLAIR hyperintensity in the pons. Possible tiny chronic lacunar infarct in the medial right thalamus. And possible tiny chronic infarct in the right cerebellum. No midline shift, mass effect, evidence of mass lesion, ventriculomegaly, extra-axial collection or acute intracranial hemorrhage. Cervicomedullary junction and pituitary are within normal limits. Vascular: Major intracranial vascular flow voids are preserved. Skull and upper cervical spine: Negative for age visible cervical spine. Visualized bone marrow signal is within normal limits. Sinuses/Orbits: Negative orbits. Paranasal sinuses and mastoids are stable and well pneumatized. Other: Visible internal auditory structures appear normal. Scalp and face soft tissues appear negative. IMPRESSION: 1. The right MCA territory infarct seen by CT tonight appears late subacute to early chronic by MRI. 2.  However, there are scattered punctate acute infarcts in the bilateral occipital lobes (PCA territories) and posterior left temporal lobe (left MCA/PCA watershed). This pattern raises the possibility of a recent embolic event. 3. Mild hemosiderin associated with #1, but no acute hemorrhage or mass effect. 4. Otherwise mild to moderate for age underlying small vessel ischemia. Electronically Signed   By: Odessa Fleming M.D.   On: 02/21/2020 22:06    PHYSICAL EXAM  Temp:  [97.8 F (36.6 C)-98.3 F (36.8 C)] 97.8 F (36.6 C) (07/28 2250) Pulse Rate:  [59-74] 74 (07/29 1147) Resp:  [16-29] 19 (07/29 1147) BP: (112-170)/(45-82) 155/74 (07/29 1147) SpO2:  [92 %-97 %] 92 % (07/29 1147)  General - Well nourished, well developed, in no apparent distress.  Ophthalmologic - fundi not visualized due to noncooperation.  Cardiovascular - Regular rhythm and rate.  Mental Status -  Level of arousal and orientation to time, place,  and person were intact. Language including expression, naming, repetition, comprehension was assessed and found intact. Fund of Knowledge was assessed and was intact.  Cranial Nerves II - XII - II - Visual field intact OU. III, IV, VI - Extraocular movements intact. V - Facial sensation intact bilaterally. VII - Facial movement intact bilaterally. VIII - Hearing & vestibular intact bilaterally. X - Palate elevates symmetrically. XI - Chin turning & shoulder shrug intact bilaterally. XII - Tongue protrusion intact.  Motor Strength - The patient's strength was normal in all extremities and pronator drift was absent.  Bulk was normal and fasciculations were absent.   Motor Tone - Muscle tone was assessed at the neck and appendages and was normal.  Reflexes - The patient's reflexes were symmetrical in all extremities and she had no pathological reflexes.  Sensory - Light touch, temperature/pinprick were assessed and were symmetrical.    Coordination - The patient had normal  movements in the hands and feet with no ataxia or dysmetria.  Tremor was absent.  Gait and Station - deferred.   ASSESSMENT/PLAN [name] is a 69 y.o. female with history of CAD, COPD & tobacco abuse 1 to 1-1/2 ppd, fibromyalgia, hyperlipidemia, chronic back pain s/p surgeries, carotid stenosis status post R CEA many years ago, presenting with blurred vision, L sided numbness, tingling. Reports palpitations.    Stroke:   bilateral MCA and MCA/PCA scattered punctate acute infarcts embolic secondary to unknown source  CT head No acute abnormality. Moderate to large R MCA territory infarct. Small vessel disease. Atrophy.   MRI  Subacute to early chronic R MCA territory infarct w/ mild hemosiderin. Scattered punctate acute infarcts B occipital lobes, poster L temporal lobe. Small vessel disease. Atrophy.   CTA head & neck no LVO. L ICA origin 50% stenosis   Carotid doppler 01/03/2020 L 40-59% stenosis   LE Doppler  pending  2D Echo EF 60-65%  Recommend loop recorder placement if above work-up negative.  LDL 70  HgbA1c 6.0  VTE prophylaxis - Lovenox 40 mg sq daily   No antithrombotic prior to admission, now on aspirin 81 mg and plavix 75 daily. Continue DAPT for 3 weeks and then ASA alone.  Therapy recommendations:  OP PT, OP OT  Disposition:  Return home  Heart palpitation  Patient stated intermittent heart palpitation  Recommend loop recorder placement if stroke work-up negative  Hx stroke/TIA  07/01/15 ? TIA. left facial numbness and double vision.  Blood pressure was reportedly 190/100.  Symptoms lasted a few minutes and resolved by the time EMS arrived.  She declined going to the ED.  04/2015 - TIA. Slurred speech, double vision and L facial numbness, HA and elevated BP. MRI w/ old R parietal cortical infarct. L ICA < 50% stenosis.   Hypertension  Stable . Permissive hypertension (OK if < 220/120) but gradually normalize in 2-3 days . Long-term BP  goal normotensive  Hyperlipidemia  Home meds:  lipitor 40  Now on lipitor 80  LDL 70, goal < 70  Continue statin at discharge  Carotid stenosis   Hx R CEA  Carotid doppler 01/03/2020 L 40-59% stenosis   CTA neck showed left 50% stenosis  Continue to follow-up with [facility] as outpatient for monitoring  Tobacco abuse  Current smoker - 1-1.5 ppd w/ 22.5 pack-year hx  Smoking cessation counseling provided  Tried to quit in the past, no success on Chantix  Pt is willing to quit    Other Stroke Risk Factors  Advanced age  ETOH use, advised to drink no more than 1 drink(s) a day  Obesity, BMI 36.8, recommend weight loss, diet and exercise as appropriate   Family hx stroke (mother, maternal grandmother)  Coronary artery disease  PAD. PVD.  Other Active Problems  COPD  Hospital day # 0  Marvel Plan, MD PhD Stroke Neurology 02/22/2020 4:39 PM  To contact Stroke Continuity provider, please refer to [email or web address]. After hours, contact General Neurology

## 2020-02-23 ENCOUNTER — Inpatient Hospital Stay (HOSPITAL_COMMUNITY): Payer: Medicare HMO

## 2020-02-23 ENCOUNTER — Encounter (HOSPITAL_COMMUNITY)
Admission: EM | Disposition: A | Discharge status: Home / Self Care | Payer: Self-pay | Source: Home / Self Care | Attending: Internal Medicine

## 2020-02-23 DIAGNOSIS — I639 Cerebral infarction, unspecified: Secondary | ICD-10-CM

## 2020-02-23 HISTORY — PX: LOOP RECORDER INSERTION: EP1214

## 2020-02-23 LAB — BASIC METABOLIC PANEL
Anion gap: 7 (ref 5–15)
BUN: 6 mg/dL — ABNORMAL LOW (ref 8–23)
CO2: 29 mmol/L (ref 22–32)
Calcium: 8.8 mg/dL — ABNORMAL LOW (ref 8.9–10.3)
Chloride: 105 mmol/L (ref 98–111)
Creatinine, Ser: 0.75 mg/dL (ref 0.44–1.00)
GFR calc Af Amer: >60 mL/min (ref >60)
GFR calc non Af Amer: >60 mL/min (ref >60)
Glucose, Bld: 123 mg/dL — ABNORMAL HIGH (ref 70–99)
Potassium: 4.1 mmol/L (ref 3.5–5.1)
Sodium: 141 mmol/L (ref 135–145)

## 2020-02-23 LAB — CBC
HCT: 43.9 % (ref 36.0–46.0)
Hemoglobin: 13.5 g/dL (ref 12.0–15.0)
MCH: 28.1 pg (ref 26.0–34.0)
MCHC: 30.8 g/dL (ref 30.0–36.0)
MCV: 91.3 fL (ref 80.0–100.0)
Platelets: 211 10*3/uL (ref 150–400)
RBC: 4.81 MIL/uL (ref 3.87–5.11)
RDW: 12.7 % (ref 11.5–15.5)
WBC: 8.4 10*3/uL (ref 4.0–10.5)
nRBC: 0 % (ref 0.0–0.2)

## 2020-02-23 LAB — MAGNESIUM: Magnesium: 1.7 mg/dL (ref 1.7–2.4)

## 2020-02-23 SURGERY — LOOP RECORDER INSERTION

## 2020-02-23 MED ORDER — DIPHENHYDRAMINE HCL 25 MG PO CAPS: 25.0000 mg | ORAL_CAPSULE | Freq: Two times a day (BID) | ORAL | Status: DC | PRN

## 2020-02-23 MED ORDER — ALENDRONATE SODIUM 70 MG PO TABS: 70.0000 mg | ORAL_TABLET | ORAL | Status: DC

## 2020-02-23 MED ORDER — OLOPATADINE HCL 0.1 % OP SOLN
1.0000 [drp] | Freq: Every day | OPHTHALMIC | Status: DC | PRN
  Filled 2020-02-23: qty 5

## 2020-02-23 MED ORDER — CYCLOBENZAPRINE HCL 5 MG PO TABS
5.0000 mg | ORAL_TABLET | Freq: Three times a day (TID) | ORAL | Status: DC
  Administered 2020-02-23: 5 mg via ORAL
  Filled 2020-02-23: qty 1

## 2020-02-23 MED ORDER — CLONAZEPAM 0.5 MG PO TABS: 1.0000 mg | ORAL_TABLET | Freq: Every evening | ORAL | Status: DC

## 2020-02-23 MED ORDER — LIDOCAINE-EPINEPHRINE 1 %-1:100000 IJ SOLN
INTRAMUSCULAR | Status: AC
  Filled 2020-02-23: qty 1

## 2020-02-23 MED ORDER — ALBUTEROL SULFATE HFA 108 (90 BASE) MCG/ACT IN AERS: 1.0000 | INHALATION_SPRAY | Freq: Four times a day (QID) | RESPIRATORY_TRACT | Status: DC | PRN

## 2020-02-23 MED ORDER — CLOPIDOGREL BISULFATE 75 MG PO TABS: 75.0000 mg | ORAL_TABLET | Freq: Every day | ORAL | 0 refills | Status: DC

## 2020-02-23 MED ORDER — ALBUTEROL SULFATE (2.5 MG/3ML) 0.083% IN NEBU: 2.5000 mg | INHALATION_SOLUTION | Freq: Four times a day (QID) | RESPIRATORY_TRACT | Status: DC | PRN

## 2020-02-23 MED ORDER — ASPIRIN 81 MG PO TBEC: 81.0000 mg | DELAYED_RELEASE_TABLET | Freq: Every day | ORAL | 11 refills | Status: AC

## 2020-02-23 MED ORDER — NICOTINE 21 MG/24HR TD PT24: 21.0000 mg | MEDICATED_PATCH | Freq: Every day | TRANSDERMAL | 0 refills | Status: DC

## 2020-02-23 MED ORDER — LIDOCAINE-EPINEPHRINE 1 %-1:100000 IJ SOLN
INTRAMUSCULAR | Status: DC | PRN
  Administered 2020-02-23: 30 mL

## 2020-02-23 SURGICAL SUPPLY — 2 items
MONITOR REVEAL LINQ II (Prosthesis & Implant Heart) ×3 IMPLANT
PACK LOOP INSERTION (CUSTOM PROCEDURE TRAY) ×3 IMPLANT

## 2020-02-23 NOTE — Progress Notes (Signed)
STROKE TEAM PROGRESS NOTE   INTERVAL HISTORY No family at bedside.  Patient sitting in bed, neuro intact.  Had lower extremity Doppler showed no DVT.  Pending loop recorder placement.  Vitals:   02/22/20 2326 02/23/20 0449 02/23/20 0809 02/23/20 1245  BP: (!) 137/64 (!) 117/55 127/66 (!) 146/67  Pulse: 65 62 66 63  Resp: 18 18 18 18   Temp: 97.8 F (36.6 C) 97.8 F (36.6 C) 97.7 F (36.5 C) 98.6 F (37 C)  TempSrc: Oral Oral Oral Oral  SpO2: 95% 95% 94% 97%   CBC:  Recent Labs  Lab 02/21/20 2018 02/21/20 2108 02/22/20 0317 02/23/20 0754  WBC 9.5   < > 9.9 8.4  NEUTROABS 5.4  --   --   --   HGB 14.3   < > 13.4 13.5  HCT 44.7   < > 41.7 43.9  MCV 90.3   < > 88.9 91.3  PLT 238   < > 209 211   < > = values in this interval not displayed.   Basic Metabolic Panel:  Recent Labs  Lab 02/22/20 0317 02/23/20 0754  NA 138 141  K 3.3* 4.1  CL 101 105  CO2 26 29  GLUCOSE 110* 123*  BUN 7* 6*  CREATININE 0.71 0.75  CALCIUM 8.8* 8.8*  MG  --  1.7   Lipid Panel:  Recent Labs  Lab 02/22/20 0317  CHOL 131  TRIG 96  HDL 42  CHOLHDL 3.1  VLDL 19  LDLCALC 70   HgbA1c:  Recent Labs  Lab 02/22/20 0044  HGBA1C 6.0*   Urine Drug Screen:  Recent Labs  Lab 02/22/20 0914  LABOPIA NONE DETECTED  COCAINSCRNUR NONE DETECTED  LABBENZ NONE DETECTED  AMPHETMU NONE DETECTED  THCU NONE DETECTED  LABBARB NONE DETECTED    Alcohol Level No results for input(s): ETH in the last 168 hours.  IMAGING past 24 hours No results found.  PHYSICAL EXAM  Temp:  [97.7 F (36.5 C)-98.6 F (37 C)] 98.6 F (37 C) (07/30 1245) Pulse Rate:  [58-72] 63 (07/30 1245) Resp:  [17-22] 18 (07/30 1245) BP: (117-151)/(55-87) 146/67 (07/30 1245) SpO2:  [92 %-98 %] 97 % (07/30 1245)  General - Well nourished, well developed, in no apparent distress.  Ophthalmologic - fundi not visualized due to noncooperation.  Cardiovascular - Regular rhythm and rate.  Mental Status -  Level of  arousal and orientation to time, place, and person were intact. Language including expression, naming, repetition, comprehension was assessed and found intact. Fund of Knowledge was assessed and was intact.  Cranial Nerves II - XII - II - Visual field intact OU. III, IV, VI - Extraocular movements intact. V - Facial sensation intact bilaterally. VII - Facial movement intact bilaterally. VIII - Hearing & vestibular intact bilaterally. X - Palate elevates symmetrically. XI - Chin turning & shoulder shrug intact bilaterally. XII - Tongue protrusion intact.  Motor Strength - The patient's strength was normal in all extremities and pronator drift was absent.  Bulk was normal and fasciculations were absent.   Motor Tone - Muscle tone was assessed at the neck and appendages and was normal.  Reflexes - The patient's reflexes were symmetrical in all extremities and she had no pathological reflexes.  Sensory - Light touch, temperature/pinprick were assessed and were symmetrical.    Coordination - The patient had normal movements in the hands and feet with no ataxia or dysmetria.  Tremor was absent.  Gait and Station - deferred.  ASSESSMENT/PLAN [name] is a 69 y.o. female with history of CAD, COPD & tobacco abuse 1 to 1-1/2 ppd, fibromyalgia, hyperlipidemia, chronic back pain s/p surgeries, carotid stenosis status post R CEA many years ago, presenting with blurred vision, L sided numbness, tingling. Reports palpitations.    Stroke:   bilateral MCA and MCA/PCA scattered punctate acute infarcts embolic secondary to unknown source  CT head No acute abnormality. Moderate to large R MCA territory infarct. Small vessel disease. Atrophy.   MRI  Subacute to early chronic R MCA territory infarct w/ mild hemosiderin. Scattered punctate acute infarcts B occipital lobes, poster L temporal lobe. Small vessel disease. Atrophy.   CTA head & neck no LVO. L ICA origin 50% stenosis    Carotid doppler 01/03/2020 L 40-59% stenosis   LE Doppler  Neg DVT   2D Echo EF 60-65%  Loop recorder placement 7/30  LDL 70  HgbA1c 6.0  VTE prophylaxis - Lovenox 40 mg sq daily   No antithrombotic prior to admission, now on aspirin 81 mg and plavix 75 daily. Continue DAPT for 3 weeks and then ASA alone.  Therapy recommendations:  OP PT, OP OT, no SLP  Disposition:  Return home  Follow up stroke clinic in 4 weeks. Office will call with appt date and time. Orders placed.   Heart palpitation  Patient stated intermittent heart palpitation  Loop recorder placement 7/30   Hx stroke/TIA  07/01/15 ? TIA. left facial numbness and double vision.  Blood pressure was reportedly 190/100.  Symptoms lasted a few minutes and resolved by the time EMS arrived.  She declined going to the ED.  04/2015 - TIA. Slurred speech, double vision and L facial numbness, HA and elevated BP. MRI w/ old R parietal cortical infarct. L ICA < 50% stenosis.   Hypertension  Stable . Permissive hypertension (OK if < 220/120) but gradually normalize in 2-3 days . Long-term BP goal normotensive  Hyperlipidemia  Home meds:  lipitor 40  Now on lipitor 80  LDL 70, goal < 70  Continue statin at discharge  Carotid stenosis   Hx R CEA  Carotid doppler 01/03/2020 L 40-59% stenosis   CTA neck showed left 50% stenosis  Continue to follow-up with [facility] as outpatient for monitoring  Tobacco abuse  Current smoker - 1-1.5 ppd w/ 22.5 pack-year hx  Smoking cessation counseling provided  Tried to quit in the past, no success on Chantix  Pt is willing to quit    Other Stroke Risk Factors  Advanced age  ETOH use, advised to drink no more than 1 drink(s) a day  Obesity, BMI 36.8, recommend weight loss, diet and exercise as appropriate   Family hx stroke (mother, maternal grandmother)  Coronary artery disease  PAD. PVD.  Other Active Problems  COPD  Hospital day # 1  Neurology will sign  off. Please call with questions. Pt will follow up with stroke clinic NP at [facility] in about 4 weeks. Thanks for the consult.   Marvel Plan, MD PhD Stroke Neurology 02/23/2020 1:33 PM  To contact Stroke Continuity provider, please refer to [email or web address]. After hours, contact General Neurology

## 2020-02-23 NOTE — Progress Notes (Signed)
Physical Therapy Treatment Patient Details Name: Norma Scott MRN: [id number] DOB: 03-Nov-1950 Today's Date: 02/23/2020    History of Present Illness Pt is a 69 y.o F presenting to the ED post-TIA of the L MCA/PCA distribution with an associated fall on her ribs and hip. Pt reported n/t of her L hand, slurred speech, headache, and confusion. She has a history of diplopia, dysphagia, falls, numbness/tingling, and dizziness episodes. PMH of CAD, fibromyalgia, COPD, lymbar disk herniation of L5-S1, TIA, carotid artery occlusion, lyperlipidemia, and gall stones.    PT Comments    Pt making steady progress overall with functional mobility. She continues to demonstrate difficulties with higher level balance tasks and would still benefit from further OPPT f/u services upon d/c. Pt would continue to benefit from skilled physical therapy services at this time while admitted and after d/c to address the below listed limitations in order to improve overall safety and independence with functional mobility.    Follow Up Recommendations  Outpatient PT;Other (comment) (Neuro OPPT)     Equipment Recommendations  None recommended by PT;Other (comment) (pt has RW at home if needed to relieve back pain)    Recommendations for Other Services       Precautions / Restrictions Precautions Precautions: Fall Restrictions Weight Bearing Restrictions: No    Mobility  Bed Mobility Overal bed mobility: Independent                Transfers Overall transfer level: Needs assistance Equipment used: None Transfers: Sit to/from Stand Sit to Stand: Supervision         General transfer comment: supervision for safety, no difficulties with transitions  Ambulation/Gait Ambulation/Gait assistance: Min guard;Min assist Gait Distance (Feet): 500 Feet Assistive device: None Gait Pattern/deviations: Step-through pattern;Decreased stride length Gait velocity: decreased   General Gait Details:  with normal, level ground ambulation without any challanges to her balance pt able to ambulate without the need for UE support or physical assistance. With higher level balance tasks, specifical horizontal head turns pt with LOB requiring min A to recover   Stairs             Wheelchair Mobility    Modified Rankin (Stroke Patients Only)       Balance Overall balance assessment: Needs assistance Sitting-balance support: Feet supported Sitting balance-Leahy Scale: Good     Standing balance support: During functional activity;No upper extremity supported Standing balance-Leahy Scale: Fair               High level balance activites: Direction changes;Turns;Head turns High Level Balance Comments: min A needed for horizontal head turns            Cognition Arousal/Alertness: Awake/alert Behavior During Therapy: WFL for tasks assessed/performed Overall Cognitive Status: Within Functional Limits for tasks assessed                                        Exercises      General Comments        Pertinent Vitals/Pain Pain Assessment: Faces Faces Pain Scale: Hurts whole lot Pain Location: back Pain Descriptors / Indicators: Sore;Grimacing;Guarding Pain Intervention(s): Monitored during session;Repositioned    Home Living                      Prior Function            PT Goals (current goals can now  be found in the care plan section) Acute Rehab PT Goals PT Goal Formulation: With patient Time For Goal Achievement: 03/07/20 Potential to Achieve Goals: Good Progress towards PT goals: Progressing toward goals    Frequency    Min 3X/week      PT Plan Current plan remains appropriate    Co-evaluation              AM-PAC PT "6 Clicks" Mobility   Outcome Measure  Help needed turning from your back to your side while in a flat bed without using bedrails?: None Help needed moving from lying on your back to sitting on the  side of a flat bed without using bedrails?: None Help needed moving to and from a bed to a chair (including a wheelchair)?: None Help needed standing up from a chair using your arms (e.g., wheelchair or bedside chair)?: None Help needed to walk in hospital room?: A Little Help needed climbing 3-5 steps with a railing? : A Little 6 Click Score: 22    End of Session Equipment Utilized During Treatment: Gait belt Activity Tolerance: Patient tolerated treatment well Patient left: in bed;with call bell/phone within reach Nurse Communication: Mobility status PT Visit Diagnosis: Other abnormalities of gait and mobility (R26.89)     Time: 0086-7619 PT Time Calculation (min) (ACUTE ONLY): 19 min  Charges:  $Gait Training: 8-22 mins                     Arletta Bale, DPT  Acute Rehabilitation Services Pager [phone] Office [phone]     Alessandra Bevels Akiko Schexnider 02/23/2020, 11:48 AM

## 2020-02-23 NOTE — Progress Notes (Signed)
PHARMACIST - PHYSICIAN COMMUNICATION  CONCERNING: P&T Medication Policy Regarding Oral Bisphosphonates  RECOMMENDATION: Your order for alendronate (Fosamax), ibandronate (Boniva), or risedronate (Actonel) has been discontinued at this time.  If the patient's post-hospital medical condition warrants safe use of this class of drugs, please resume the pre-hospital regimen upon discharge.  DESCRIPTION:  Alendronate (Fosamax), ibandronate (Boniva), and risedronate (Actonel) can cause severe esophageal erosions in patients who are unable to remain upright at least 30 minutes after taking this medication.   Since brief interruptions in therapy are thought to have minimal impact on bone mineral density, the Pharmacy & Therapeutics Committee has established that bisphosphonate orders should be routinely discontinued during hospitalization.   To override this safety policy and permit administration of Boniva, Fosamax, or Actonel in the hospital, prescribers must write "DO NOT HOLD" in the comments section when placing the order for this class of medications.  Sanda Klein, PharmD, RPh  PGY-1 Pharmacy Resident 02/23/2020 3:07 PM  Please check [email or web address] for unit-specific pharmacy phone numbers.

## 2020-02-23 NOTE — TOC Initial Note (Signed)
Transition of Care [facility]) - Initial/Assessment Note    Patient Details  Name: Norma Scott MRN: [id number] Date of Birth: 01/10/1951  [facility] CM/SW Contact:    Kingsley Plan, RN Phone Number: 02/23/2020, 12:02 PM  Clinical Narrative:                 Patient from home alone, however son and his family live in upstairs. Patient has cane and walker already at home.   PT recommendation for OP PT at Neuro rehab, patient and son in agreement. Order placed. Information on AVS   Expected Discharge Plan: Home/Self Care Barriers to Discharge: Continued Medical Work up   Patient Goals and CMS Choice Patient states their goals for this hospitalization and ongoing recovery are:: to return to home CMS Medicare.gov Compare Post Acute Care list provided to:: Patient Choice offered to / list presented to : Patient  Expected Discharge Plan and Services Expected Discharge Plan: Home/Self Care   Discharge Planning Services: CM Consult   Living arrangements for the past 2 months: Apartment                 DME Arranged: N/A         HH Arranged: NA          Prior Living Arrangements/Services Living arrangements for the past 2 months: Apartment Lives with:: Self Patient language and need for interpreter reviewed:: Yes        Need for Family Participation in Patient Care: Yes (Comment) Care giver support system in place?: Yes (comment) Current home services: DME Criminal Activity/Legal Involvement Pertinent to Current Situation/Hospitalization: No - Comment as needed  Activities of Daily Living Home Assistive Devices/Equipment: None ADL Screening (condition at time of admission) Patient's cognitive ability adequate to safely complete daily activities?: Yes Is the patient deaf or have difficulty hearing?: No Does the patient have difficulty seeing, even when wearing glasses/contacts?: No Does the patient have difficulty concentrating, remembering, or  making decisions?: No Patient able to express need for assistance with ADLs?: No Does the patient have difficulty dressing or bathing?: No Independently performs ADLs?: Yes (appropriate for developmental age) Does the patient have difficulty walking or climbing stairs?: No Weakness of Legs: None Weakness of Arms/Hands: None  Permission Sought/Granted   Permission granted to share information with : No              Emotional Assessment Appearance:: Appears stated age Attitude/Demeanor/Rapport: Engaged Affect (typically observed): Accepting Orientation: : Oriented to Self, Oriented to Place, Oriented to  Time, Oriented to Situation Alcohol / Substance Use: Not Applicable Psych Involvement: No (comment)  Admission diagnosis:  TIA (transient ischemic attack) [G45.9] CVA (cerebral vascular accident) (HCC) [I63.9] Left sided numbness [R20.0] Acute CVA (cerebrovascular accident) [facility]) [I63.9] Patient Active Problem List   Diagnosis Date Noted  . CVA (cerebral vascular accident) (HCC) 02/22/2020  . Acute CVA (cerebrovascular accident) (HCC) 02/22/2020  . Left sided numbness   . COPD (chronic obstructive pulmonary disease) (HCC) 02/21/2020  . COPD exacerbation (HCC) 07/03/2016  . Acute respiratory failure with hypoxia (HCC) 07/03/2016  . Hyponatremia 07/03/2016  . Essential hypertension 05/21/2015  . Hyperlipidemia 05/21/2015  . Arteriosclerotic coronary artery disease 05/21/2015  . Aftercare following surgery of the circulatory system, NEC 01/22/2014  . Pain in limb-Bilateral shoulder Right > left 05/15/2013  . Occlusion and stenosis of carotid artery without mention of cerebral infarction 05/16/2012  . Peripheral vascular disease (HCC) 05/16/2012  . PAD (peripheral artery disease) (HCC)  02/08/2012   PCP:  Merri Brunette, MD Pharmacy:   [facility] [address], [place] - [address] [place] [address] Phone: [phone] Fax:  [phone]  [facility] - [place], [place] - [address] [place] [address] Phone: [phone] Fax: [phone]     Social Determinants of Health (SDOH) Interventions    Readmission Risk Interventions No flowsheet data found.

## 2020-02-23 NOTE — Consult Note (Addendum)
ELECTROPHYSIOLOGY CONSULT NOTE  Patient ID: Norma Scott MRN: [id number], DOB/AGE: 28-Apr-1951   Admit date: 02/21/2020 Date of Consult: 02/23/2020  Primary Physician: Merri Brunette, MD Primary Cardiologist: Dr. Duke Salvia (2016) Reason for Consultation: Cryptogenic stroke ; recommendations regarding Implantable Loop Recorder, requested by Dr. Roda Shutters  History of Present Illness Norma Scott was admitted on 02/21/2020 with stroke.      PMHx includes: HTN, HLD, PVD (R CEA), prior TIA, unclear hx of remote MI, COPD/asthma, smoker, fibromyalgia  Neurology noted: bilateral MCA and MCA/PCA scattered punctate acute infarcts embolic secondary to unknown source.  she has undergone workup for stroke including echocardiogram and carotid dopplers.  The patient has been monitored on telemetry which has demonstrated sinus rhythm with no arrhythmias.    Neurology has deferred TEE   Echocardiogram this admission demonstrated  IMPRESSIONS  1. Left ventricular ejection fraction, by estimation, is 60 to 65%. The  left ventricle has normal function. The left ventricle has no regional  wall motion abnormalities. There is moderate left ventricular hypertrophy.  Left ventricular diastolic  parameters are indeterminate.   2. Right ventricular systolic function is normal. The right ventricular  size is normal. Tricuspid regurgitation signal is inadequate for assessing  PA pressure.   3. The mitral valve is grossly normal. No evidence of mitral valve  regurgitation.   4. The aortic valve was not well visualized. Aortic valve regurgitation  is not visualized. Mild to moderate aortic valve sclerosis/calcification  is present, without any evidence of aortic stenosis.   5. The inferior vena cava is normal in size with greater than 50%  respiratory variability, suggesting right atrial pressure of 3 mmHg.      Lab work is reviewed.  LE venous US completed, preliminary result is  negative     Prior to admission, the patient denies chest pain, shortness of breath, dizziness,  or syncope. She does have very fleeting palpitations, the frequency is hard for her to nail down, maybe once a month or so, sometimes not that often   They are recovering from their stroke with plans to home at discharge.   Past Medical History:  Diagnosis Date   Allergy    allergic rhinitis   Arteriosclerotic coronary artery disease 05/21/2015   Asthma    CAD (coronary artery disease)    patient says no problems   dr Severiano Gilbert pcp   Carotid artery occlusion    Cervical disc displacement    x2   COPD (chronic obstructive pulmonary disease) (HCC)    Esophageal reflux    Essential hypertension 05/21/2015   Family history of adverse reaction to anesthesia    mother hard to wake and affected b/p   Fibromyalgia    Gall stones    Generalized anxiety disorder    Hyperlipidemia    Lumbar disc herniation    L5-S1   Mini stroke (HCC)    TIA (transient ischemic attack) 05/21/2015     Surgical History:  Past Surgical History:  Procedure Laterality Date   BACK SURGERY  06/01/2012   lumbar   CAROTID ENDARTERECTOMY  02/01/2009   Right   CERVICAL FUSION     1995, 1996   CHOLECYSTECTOMY     CHOLECYSTECTOMY     ECTOPIC PREGNANCY SURGERY  89   ERCP N/A 11/20/2015   Procedure: ENDOSCOPIC RETROGRADE CHOLANGIOPANCREATOGRAPHY (ERCP);  Surgeon: Willis Modena, MD;  Location: [facility];  Service: Endoscopy;  Laterality: N/A;   EUS N/A 11/20/2015   Procedure: UPPER ENDOSCOPIC  ULTRASOUND (EUS) RADIAL;  Surgeon: Willis Modena, MD;  Location: [facility];  Service: Endoscopy;  Laterality: N/A;   TUBAL LIGATION       Medications Prior to Admission  Medication Sig Dispense Refill Last Dose   albuterol (PROVENTIL HFA) 108 (90 Base) MCG/ACT inhaler Inhale 1 puff into the lungs every 6 (six) hours as needed for wheezing or shortness of breath. 1 Inhaler 0 Past Month at Unknown time    alendronate (FOSAMAX) 70 MG tablet Take 70 mg by mouth every Monday.    Past Week at Unknown time   Ascorbic Acid (VITAMIN C PO) Take 1 tablet by mouth daily at 12 noon.   02/21/2020 at Unknown time   atorvastatin (LIPITOR) 40 MG tablet Take 40 mg by mouth at bedtime.    02/21/2020 at Unknown time   CALCIUM PO Take 2 tablets by mouth daily at 12 noon.   Past Month at Unknown time   Cholecalciferol (VITAMIN D3 PO) Take 1 tablet by mouth daily at 12 noon.   02/21/2020 at Unknown time   clonazePAM (KLONOPIN) 0.5 MG tablet Take 1 mg by mouth every evening.    02/21/2020 at Unknown time   cyclobenzaprine (FLEXERIL) 10 MG tablet Take 0.5-1 tablets (5-10 mg total) by mouth 3 (three) times daily as needed for muscle spasms. (Patient taking differently: Take 5-10 mg by mouth 3 (three) times daily. ) 50 tablet 1 02/21/2020 at Unknown time   diphenhydrAMINE (BENADRYL) 25 MG tablet Take 25 mg by mouth 2 (two) times daily as needed for itching or allergies.   02/21/2020 at Unknown time   FLUoxetine (PROZAC) 20 MG capsule Take 60 mg by mouth daily.    02/21/2020 at Unknown time   furosemide (LASIX) 20 MG tablet Take 20 mg by mouth every other day.    Past Week at Unknown time   gabapentin (NEURONTIN) 300 MG capsule Take 300-600 mg by mouth 3 (three) times daily. 300 mg twice daily. 600 mg at night   02/21/2020 at Unknown time   ibuprofen (ADVIL) 200 MG tablet Take 400 mg by mouth daily as needed for moderate pain.   Past Week at Unknown time   lisinopril (ZESTRIL) 30 MG tablet Take 30 mg by mouth daily.    02/21/2020 at Unknown time   metoprolol tartrate (LOPRESSOR) 25 MG tablet Take 25 mg by mouth 2 (two) times daily.   02/21/2020 at 2100   Olopatadine HCl (PATADAY OP) Place 2 drops into both eyes daily as needed (Dry/itchy eyes).   Past Month at Unknown time   omeprazole (PRILOSEC) 40 MG capsule Take 40 mg by mouth daily.    02/21/2020 at Unknown time   tiZANidine (ZANAFLEX) 4 MG tablet Take 4 mg by mouth 3 (three) times  daily as needed for muscle spasms.   02/21/2020 at Unknown time   TRELEGY ELLIPTA 100-62.5-25 MCG/INH AEPB Inhale 1 puff into the lungs daily.   Past Week at Unknown time    Inpatient Medications:   aspirin EC  81 mg Oral Daily   atorvastatin  80 mg Oral QHS   clopidogrel  75 mg Oral Daily   enoxaparin (LOVENOX) injection  40 mg Subcutaneous Daily   FLUoxetine  60 mg Oral Daily   fluticasone furoate-vilanterol  1 puff Inhalation Daily   And   umeclidinium bromide  1 puff Inhalation Daily   furosemide  20 mg Oral QODAY   gabapentin  300 mg Oral BID WC   And   gabapentin  600 mg Oral QHS   metoprolol tartrate  25 mg Oral BID   nicotine  21 mg Transdermal Daily   pantoprazole  40 mg Oral Daily    Allergies:  Allergies  Allergen Reactions   Bupropion Other (See Comments)    Hallucinations Other reaction(s): Hallucinations   Septra [Sulfamethoxazole-Trimethoprim] Nausea And Vomiting    Vomiting   Sulfa Antibiotics Nausea And Vomiting   Zithromax [Azithromycin] Hives   Cymbalta [Duloxetine Hcl] Other (See Comments)    fatigue    Social History   Socioeconomic History   Marital status: Legally Separated    Spouse name: Not on file   Number of children: Not on file   Years of education: Not on file   Highest education level: Not on file  Occupational History   Not on file  Tobacco Use   Smoking status: Current Some Day Smoker    Packs/day: 0.50    Years: 45.00    Pack years: 22.50    Types: Cigarettes   Smokeless tobacco: Never Used   Tobacco comment: states she only smokes about 1/2 pack  Substance and Sexual Activity   Alcohol use: Yes    Alcohol/week: 0.0 standard drinks    Comment: 2 times a year   Drug use: No   Sexual activity: Not on file  Other Topics Concern   Not on file  Social History Narrative   Epworth Sleepiness Scale = 10 (as of 05/21/2015)   Social Determinants of Health   Financial Resource Strain:    Difficulty of Paying Living Expenses:    Food Insecurity:    Worried About Programme researcher, broadcasting/film/video in the Last Year:    Barista in the Last Year:   Transportation Needs:    Freight forwarder (Medical):    Lack of Transportation (Non-Medical):   Physical Activity:    Days of Exercise per Week:    Minutes of Exercise per Session:   Stress:    Feeling of Stress :   Social Connections:    Frequency of Communication with Friends and Family:    Frequency of Social Gatherings with Friends and Family:    Attends Religious Services:    Active Member of Clubs or Organizations:    Attends Engineer, structural:    Marital Status:   Intimate Partner Violence:    Fear of Current or Ex-Partner:    Emotionally Abused:    Physically Abused:    Sexually Abused:      Family History  Problem Relation Age of Onset   Stroke Mother    Hypertension Mother    Hyperlipidemia Mother    Seizures Mother        Grand mal seizures   Emphysema Mother    Diabetes Father    Heart disease Father        Heart Disease before age 34   Stroke Maternal Grandmother    Heart Problems Maternal Grandmother    Cancer Maternal Grandfather        stomach cancer   Heart attack Maternal Grandfather    Heart disease Paternal Grandfather    Heart attack Paternal Grandfather    Deep vein thrombosis Brother    Asthma Brother    Cancer Paternal Grandmother 74   Heart disease Paternal Uncle    Heart attack Paternal Uncle       Review of Systems: All other systems reviewed and are otherwise negative except as noted above.  Physical Exam: Vitals:  02/22/20 2326 02/23/20 0449 02/23/20 0809 02/23/20 1245  BP: (!) 137/64 (!) 117/55 127/66 (!) 146/67  Pulse: 65 62 66 63  Resp: Temp: 97.8 F (36.6 C) 97.8 F (36.6 C) 97.7 F (36.5 C) 98.6 F (37 C)  TempSrc: Oral Oral Oral Oral  SpO2: 95% 95% 94% 97%    GEN- The patient is well appearing, alert and oriented x 3 today.   Head- normocephalic, atraumatic Eyes-   Sclera clear, conjunctiva pink Ears- hearing intact Oropharynx- clear Neck- supple Lungs- CTA b/l, normal work of breathing Heart- RRR, no murmurs, rubs or gallops  GI- soft, NT, ND Extremities- no clubbing, cyanosis, or edema MS- no significant deformity or atrophy Skin- no rash or lesion Psych- euthymic mood, full affect   Labs:   Lab Results  Component Value Date   WBC 8.4 02/23/2020   HGB 13.5 02/23/2020   HCT 43.9 02/23/2020   MCV 91.3 02/23/2020   PLT 211 02/23/2020    Recent Labs  Lab 02/22/20 0317 02/22/20 0317 02/23/20 0754  NA 138   < > 141  K 3.3*   < > 4.1  CL 101   < > 105  CO2 26   < > 29  BUN 7*   < > 6*  CREATININE 0.71   < > 0.75  CALCIUM 8.8*   < > 8.8*  PROT 6.6  --   --   BILITOT 0.6  --   --   ALKPHOS 89  --   --   ALT 19  --   --   AST 26  --   --   GLUCOSE 110*   < > 123*   < > = values in this interval not displayed.   No results found for: CKTOTAL, CKMB, CKMBINDEX, TROPONINI Lab Results  Component Value Date   CHOL 131 02/22/2020   CHOL 129 07/18/2015   Lab Results  Component Value Date   HDL 42 02/22/2020   HDL 41.40 07/18/2015   Lab Results  Component Value Date   LDLCALC 70 02/22/2020   LDLCALC 67 07/18/2015   Lab Results  Component Value Date   TRIG 96 02/22/2020   TRIG 106.0 07/18/2015   Lab Results  Component Value Date   CHOLHDL 3.1 02/22/2020   CHOLHDL 3 07/18/2015   No results found for: LDLDIRECT  No results found for: DDIMER   Radiology/Studies:   CT ANGIO HEAD W OR WO CONTRAST Result Date: 02/22/2020 CLINICAL DATA:  Episode of left hand and lips numbness and tingling, visual disturbance EXAM: CT ANGIOGRAPHY HEAD AND NECK TECHNIQUE: Multidetector CT imaging of the head and neck was performed using the standard protocol during bolus administration of intravenous contrast. Multiplanar CT image reconstructions and MIPs were obtained to evaluate the vascular anatomy. Carotid stenosis measurements (when  applicable) are obtained utilizing NASCET criteria, using the distal internal carotid diameter as the denominator. CONTRAST:  75mL OMNIPAQUE IOHEXOL 350 MG/ML SOLN COMPARISON:  Recent CT and MRI brain, MRA neck 2016 FINDINGS: CT HEAD Brain: There is no acute intracranial hemorrhage, mass effect, or edema. No new loss of gray-white differentiation. Tiny infarcts on prior MRI are not visible. Chronic right MCA territory infarct involving frontal lobe and insula. Mild ex vacuo dilatation of the adjacent right lateral ventricle. There is no extra-axial fluid collection. Ventricles are stable in size. Vascular: There is atherosclerotic calcification at the skull base. Skull: Calvarium is unremarkable. Sinuses/Orbits: No acute finding. Other: None. Review  of the MIP images confirms the above findings CTA NECK Aortic arch: Mild calcified plaque at the patent great vessel origins. Right carotid system: Common carotid is patent with multifocal mild calcified plaque. Internal and external carotid arteries are patent. There is no measurable stenosis at the ICA origin. Left carotid system: Common carotid is patent with mild calcified plaque distally. Internal and external carotid arteries are patent. Heavily calcified plaque at the ICA origin limits stenosis measurement due to artifact but there is approximately 50% stenosis. Vertebral arteries: Patent.  Left vertebral artery is dominant. Skeleton: Multilevel degenerative changes of the cervical spine. Other neck: No mass or adenopathy. Upper chest: Emphysema. Review of the MIP images confirms the above findings CTA HEAD Anterior circulation: Intracranial internal carotid arteries are patent with calcified plaque causing mild stenosis. Anterior cerebral arteries are patent. Middle cerebral arteries are patent. Posterior circulation: Intracranial vertebral arteries patent with mild calcified plaque on the left. Basilar artery is patent. Posterior cerebral arteries are patent.  Venous sinuses: Patent as allowed by contrast bolus timing. Review of the MIP images confirms the above findings IMPRESSION: No acute intracranial hemorrhage. Small infarcts on MRI are not visible. No large vessel occlusion. Calcified plaque at the left ICA origin causes approximately 50% stenosis. Electronically Signed   By: Guadlupe SpanishPraneil  Patel M.D.   On: 02/22/2020 12:55    CT HEAD WO CONTRAST Result Date: 02/21/2020 CLINICAL DATA:  69 year old female with neurologic deficit. EXAM: CT HEAD WITHOUT CONTRAST TECHNIQUE: Contiguous axial images were obtained from the base of the skull through the vertex without intravenous contrast. COMPARISON:  Head CT dated 07/17/2017. FINDINGS: Brain: Mild age-related atrophy and chronic microvascular ischemic changes. There is a moderate to large right MCA territory infarct, likely subacute. An acute infarct is not excluded correlation with clinical exam and further evaluation with MRI is recommended. There is no acute intracranial hemorrhage. No mass effect or midline shift. No extra-axial fluid collection. Vascular: No hyperdense vessel or unexpected calcification. Skull: Normal. Negative for fracture or focal lesion. Sinuses/Orbits: No acute finding. Other: None IMPRESSION: 1. No acute intracranial hemorrhage. 2. 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 An acute infarct is not excluded. Further evaluation with MRI is recommended. 3. Mild age-related atrophy and chronic microvascular ischemic changes. These results were called by telephone at the time of interpretation on 02/21/2020 at 8:58 pm to provider Geoffery LyonsUGLAS DELO , who verbally acknowledged these results. Electronically Signed   By: Elgie CollardArash  Radparvar M.D.   On: 02/21/2020 21:04     MR BRAIN WO CONTRAST Result Date: 02/21/2020 CLINICAL DATA:  69 year old female with neurologic deficit and suspected subacute right MCA infarct on plain head CT earlier tonight. EXAM: MRI HEAD WITHOUT CONTRAST TECHNIQUE:  Multiplanar, multiecho pulse sequences of the brain and surrounding structures were obtained without intravenous contrast. COMPARISON:  Head CT 2047 hours today. FINDINGS: Brain: Corresponding to the CT abnormality there is abnormal T2 and FLAIR hyperintensity at the right frontal operculum, adjacent right insula. However, this is facilitated on ADC and isointense on trace diffusion. Mild associated hemosiderin on SWI. However, there are multiple punctate foci of cortical restricted diffusion scattered in the bilateral superior occipital lobes (series 5, image 77) and also in the posterior left temporal lobe. No other restricted diffusion identified; the posterior fossa appear spared. No other cortical encephalomalacia identified but there are occasional white matter foci most suggestive of chronic lacunar infarcts (right parietal lobe series 11, image 16). There is moderate T2 and FLAIR hyperintensity in  the pons. Possible tiny chronic lacunar infarct in the medial right thalamus. And possible tiny chronic infarct in the right cerebellum. No midline shift, mass effect, evidence of mass lesion, ventriculomegaly, extra-axial collection or acute intracranial hemorrhage. Cervicomedullary junction and pituitary are within normal limits. Vascular: Major intracranial vascular flow voids are preserved. Skull and upper cervical spine: Negative for age visible cervical spine. Visualized bone marrow signal is within normal limits. Sinuses/Orbits: Negative orbits. Paranasal sinuses and mastoids are stable and well pneumatized. Other: Visible internal auditory structures appear normal. Scalp and face soft tissues appear negative. IMPRESSION: 1. The right MCA territory infarct seen by CT tonight appears late subacute to early chronic by MRI. 2. However, there are scattered punctate acute infarcts in the bilateral occipital lobes (PCA territories) and posterior left temporal lobe (left MCA/PCA watershed). This pattern raises the  possibility of a recent embolic event. 3. Mild hemosiderin associated with #1, but no acute hemorrhage or mass effect. 4. Otherwise mild to moderate for age underlying small vessel ischemia. Electronically Signed   By: Odessa Fleming M.D.   On: 02/21/2020 22:06      12-lead ECG SR All prior EKG's in EPIC reviewed with no documented atrial fibrillation  Telemetry SR, she has ONE couplet and one NSVT of 10beats, no AFib   Assessment and Plan:  1. Cryptogenic stroke The patient presents with cryptogenic stroke.  I and Dr. Elberta Fortis spoke at length with the patient about monitoring for afib with either a 30 day event monitor or an implantable loop recorder.  Risks, benefits, and alteratives to implantable loop recorder were discussed with the patient today.   At this time, the patient is very clear in her decision to proceed with implantable loop recorder.   Wound care was reviewed with the patient (keep incision clean and dry for 3 days).  Wound check Norma Scott be scheduled for the patient  Please call with questions.   Sheilah Pigeon, PA-C 02/23/2020  I have seen and examined this patient with Francis Dowse.  Agree with above, note added to reflect my findings.  On exam, RRR, no murmurs, lungs clear.  Patient presented to the hospital with cryptogenic stroke. To date, no cause has been found. TEE planned for today. If unrevealing, Zarria Towell plan for LINQ monitor to look for atrial fibrillation. Risks and benefits discussed. Risks include but not limited to bleeding and infection. The patient understands the risks and has agreed to the procedure.  Krystale Rinkenberger M. Brier Reid MD 02/23/2020 2:16 PM

## 2020-02-23 NOTE — Progress Notes (Signed)
Nsg Discharge Note  Son Marcial Pacas present for discharge teaching and will be providing transportation home. Questions answered.  Admit Date:  02/21/2020 Discharge date: 02/23/2020   Norma Scott to be D/C'd Home per MD order.  AVS completed.  Copy for chart, and copy for patient signed, and dated. Patient/caregiver able to verbalize understanding.  Discharge Medication: Allergies as of 02/23/2020      Reactions   Bupropion Other (See Comments)   Hallucinations Other reaction(s): Hallucinations   Septra [sulfamethoxazole-trimethoprim] Nausea And Vomiting   Vomiting   Sulfa Antibiotics Nausea And Vomiting   Zithromax [azithromycin] Hives   Cymbalta [duloxetine Hcl] Other (See Comments)   fatigue      Medication List    STOP taking these medications   ibuprofen 200 MG tablet Commonly known as: ADVIL     TAKE these medications   albuterol 108 (90 Base) MCG/ACT inhaler Commonly known as: Proventil HFA Inhale 1 puff into the lungs every 6 (six) hours as needed for wheezing or shortness of breath.   alendronate 70 MG tablet Commonly known as: FOSAMAX Take 70 mg by mouth every Monday.   aspirin 81 MG EC tablet Take 1 tablet (81 mg total) by mouth daily. Swallow whole. Start taking on: February 24, 2020   atorvastatin 40 MG tablet Commonly known as: LIPITOR Take 40 mg by mouth at bedtime.   CALCIUM PO Take 2 tablets by mouth daily at 12 noon.   clonazePAM 0.5 MG tablet Commonly known as: KLONOPIN Take 1 mg by mouth every evening.   clopidogrel 75 MG tablet Commonly known as: PLAVIX Take 1 tablet (75 mg total) by mouth daily. Start taking on: February 24, 2020   cyclobenzaprine 10 MG tablet Commonly known as: FLEXERIL Take 0.5-1 tablets (5-10 mg total) by mouth 3 (three) times daily as needed for muscle spasms. What changed: when to take this   diphenhydrAMINE 25 MG tablet Commonly known as: BENADRYL Take 25 mg by mouth 2 (two) times daily as needed for itching  or allergies.   FLUoxetine 20 MG capsule Commonly known as: PROZAC Take 60 mg by mouth daily.   furosemide 20 MG tablet Commonly known as: LASIX Take 20 mg by mouth every other day.   gabapentin 300 MG capsule Commonly known as: NEURONTIN Take 300-600 mg by mouth 3 (three) times daily. 300 mg twice daily. 600 mg at night   lisinopril 30 MG tablet Commonly known as: ZESTRIL Take 30 mg by mouth daily.   metoprolol tartrate 25 MG tablet Commonly known as: LOPRESSOR Take 25 mg by mouth 2 (two) times daily.   nicotine 21 mg/24hr patch Commonly known as: NICODERM CQ - dosed in mg/24 hours Place 1 patch (21 mg total) onto the skin daily. Start taking on: February 24, 2020   omeprazole 40 MG capsule Commonly known as: PRILOSEC Take 40 mg by mouth daily.   PATADAY OP Place 2 drops into both eyes daily as needed (Dry/itchy eyes).   tiZANidine 4 MG tablet Commonly known as: ZANAFLEX Take 4 mg by mouth 3 (three) times daily as needed for muscle spasms.   Trelegy Ellipta 100-62.5-25 MCG/INH Aepb Generic drug: Fluticasone-Umeclidin-Vilant Inhale 1 puff into the lungs daily.   VITAMIN C PO Take 1 tablet by mouth daily at 12 noon.   VITAMIN D3 PO Take 1 tablet by mouth daily at 12 noon.       Discharge Assessment: Vitals:   02/23/20 1245 02/23/20 1646  BP: (!) 146/67 (!) 150/65  Pulse: 63 68  Resp: 18 18  Temp: 98.6 F (37 C) (!) 97.4 F (36.3 C)  SpO2: 97% 95%   Skin clean, dry and intact without evidence of skin break down, no evidence of skin tears noted. IV catheter discontinued intact. Site without signs and symptoms of complications - no redness or edema noted at insertion site, patient denies c/o pain - only slight tenderness at site.  Dressing with slight pressure applied.  D/c Instructions-Education: Discharge instructions given to patient/family with verbalized understanding. D/c education completed with patient/family including follow up instructions,  medication list, d/c activities limitations if indicated, with other d/c instructions as indicated by MD - patient able to verbalize understanding, all questions fully answered. Patient instructed to return to ED, call 911, or call MD for any changes in condition.  Patient escorted via WC, and D/C home via private auto.  Boykin Nearing, RN 02/23/2020 5:01 PM

## 2020-02-23 NOTE — Progress Notes (Signed)
Bilateral lower extremity venous duplex has been completed. Preliminary results can be found in CV Proc through chart review.   02/23/20 12:45 PM Olen Cordial RVT

## 2020-02-23 NOTE — Discharge Summary (Signed)
Physician Discharge Summary  Norma Scott ZOX:[id number] DOB: 03/04/1951 DOA: 02/21/2020  PCP: Merri BrunetteSmith, Candace, MD  Admit date: 02/21/2020 Discharge date: 02/23/2020  Admitted From: Home  Discharge disposition: Home    Recommendations for Outpatient Follow-Up:   . Follow up with your primary care provider in one week.  . Check CBC, BMP, mg in the next visit . [facility] in 4 weeks and with cardiology clinic as has been scheduled.   Discharge Diagnosis:   Principal Problem:   Acute CVA (cerebrovascular accident) [facility](HCC) Active Problems:   PAD (peripheral artery disease) (HCC)   Peripheral vascular disease (HCC)   Essential hypertension   Hyperlipidemia   CVA (cerebral vascular accident) [facility](HCC)   Discharge Condition: Improved.  Diet recommendation: Low sodium, heart healthy.    Wound care: keep incision clean and dry for 3 days  Code status: Full.   History of Present Illness:   Patient is a 69 year old female with past medical history of PAD, essential hypertension, hyperlipidemia, COPD, CVA, presented to the hospital with acute onset blurred vision and numbness in the left hand and lips. She managed to drive home, her niece came out to help her to the house. She fell and hit her ribs and a hip. There was no loss of consciousness.   Subsequently, 911 was called she was sent to the ER. She had mild slurred speech, headache and some confusion. She has some mild left upper extremity finger numbness. While in the ER the symptoms improved.  Patient was then admitted to the hospital.   Hospital Course:   Following conditions were addressed during hospitalization as listed below,  Acute stroke, multifocal likely embolic, cryptogenic MRI showed bilateral MCA and PCA scattered punctate acute infarcts likely embolic in nature.  CTA head and neck with no large vessel occlusion.  Left ICA with 50% stenosis.  Lower extremity Doppler  with DVT  2D echocardiogram showed left ventricular ejection fraction of 60 to 65%.  Neurology  recommend dual antiplatelets for 3 weeks then aspirin alone.   Hemoglobin A1c of 6.0.  She does have history of intermittent palpitations.  Physical therapy seen the patient and recommend outpatient physical therapy with rolling walker on discharge.  Patient underwent loop recorder placement on 02/23/2020 to monitor for atrial fibrillation.  Cardiology and neurology to follow the patient after discharge.  Hypokalemia.  Replenished and improved.  Carotid stenosis.  Left 50% stenosis.  History of right carotid endarterectomy.  Will need outpatient follow-up.  Smoking cessation counseling done extensively.  Essential hypertension On lisinopril and metoprolol as outpatient.    Will resume on discharge.  Hyperlipidemia On lipitor.  Continue on discharge.  peripheral artery disease On lipitor  Tobacco use disorder and history of copd. Counseled.  Will consider nicotine patch on discharge.    Disposition.  At this time, patient is stable for disposition home with outpatient physical therapy.  Spoke with the patient's son at bedside regarding the plan for disposition.  Medical Consultants:    Neurology  Electrophysiology cardiology  Procedures:    Loop recorder implantation on 02/23/2020 Subjective:   Today, patient feels okay.denies numbness weakness dizziness lightheadedness.  Discharge Exam:   Vitals:   02/23/20 0809 02/23/20 1245  BP: 127/66 (!) 146/67  Pulse: 66 63  Resp: 18 18  Temp: 97.7 F (36.5 C) 98.6 F (37 C)  SpO2: 94% 97%   Vitals:   02/22/20 2326 02/23/20 0449 02/23/20 0809 02/23/20 1245  BP: (!) 137/64 Marland Kitchen(!)  117/55 127/66 (!) 146/67  Pulse: 65 62 66 63  Resp: Temp: 97.8 F (36.6 C) 97.8 F (36.6 C) 97.7 F (36.5 C) 98.6 F (37 C)  TempSrc: Oral Oral Oral Oral  SpO2: 95% 95% 94% 97%   There is no height or weight on file to calculate  BMI.  General: Alert awake, not in obvious distress HENT: pupils equally reacting to light,  No scleral pallor or icterus noted. Oral mucosa is moist.  Chest:  Clear breath sounds.  Diminished breath sounds bilaterally. No crackles or wheezes.  CVS: S1 &S2 heard. No murmur.  Regular rate and rhythm. Abdomen: Soft, nontender, nondistended.  Bowel sounds are heard.   Extremities: No cyanosis, clubbing or edema.  Peripheral pulses are palpable. Psych: Alert, awake and oriented, normal mood CNS:  No cranial nerve deficits.  Power equal in all extremities.   Skin: Warm and dry.  No rashes noted.  The results of significant diagnostics from this hospitalization (including imaging, microbiology, ancillary and laboratory) are listed below for reference.     Diagnostic Studies:   CT ANGIO HEAD W OR WO CONTRAST  Result Date: 02/22/2020 CLINICAL DATA:  Episode of left hand and lips numbness and tingling, visual disturbance EXAM: CT ANGIOGRAPHY HEAD AND NECK TECHNIQUE: Multidetector CT imaging of the head and neck was performed using the standard protocol during bolus administration of intravenous contrast. Multiplanar CT image reconstructions and MIPs were obtained to evaluate the vascular anatomy. Carotid stenosis measurements (when applicable) are obtained utilizing NASCET criteria, using the distal internal carotid diameter as the denominator. CONTRAST:  75mL OMNIPAQUE IOHEXOL 350 MG/ML SOLN COMPARISON:  Recent CT and MRI brain, MRA neck 2016 FINDINGS: CT HEAD Brain: There is no acute intracranial hemorrhage, mass effect, or edema. No new loss of gray-white differentiation. Tiny infarcts on prior MRI are not visible. Chronic right MCA territory infarct involving frontal lobe and insula. Mild ex vacuo dilatation of the adjacent right lateral ventricle. There is no extra-axial fluid collection. Ventricles are stable in size. Vascular: There is atherosclerotic calcification at the skull base. Skull:  Calvarium is unremarkable. Sinuses/Orbits: No acute finding. Other: None. Review of the MIP images confirms the above findings CTA NECK Aortic arch: Mild calcified plaque at the patent great vessel origins. Right carotid system: Common carotid is patent with multifocal mild calcified plaque. Internal and external carotid arteries are patent. There is no measurable stenosis at the ICA origin. Left carotid system: Common carotid is patent with mild calcified plaque distally. Internal and external carotid arteries are patent. Heavily calcified plaque at the ICA origin limits stenosis measurement due to artifact but there is approximately 50% stenosis. Vertebral arteries: Patent.  Left vertebral artery is dominant. Skeleton: Multilevel degenerative changes of the cervical spine. Other neck: No mass or adenopathy. Upper chest: Emphysema. Review of the MIP images confirms the above findings CTA HEAD Anterior circulation: Intracranial internal carotid arteries are patent with calcified plaque causing mild stenosis. Anterior cerebral arteries are patent. Middle cerebral arteries are patent. Posterior circulation: Intracranial vertebral arteries patent with mild calcified plaque on the left. Basilar artery is patent. Posterior cerebral arteries are patent. Venous sinuses: Patent as allowed by contrast bolus timing. Review of the MIP images confirms the above findings IMPRESSION: No acute intracranial hemorrhage. Small infarcts on MRI are not visible. No large vessel occlusion. Calcified plaque at the left ICA origin causes approximately 50% stenosis. Electronically Signed   By: Guadlupe Spanish M.D.   On:  02/22/2020 12:55   DG Chest 2 View  Result Date: 02/21/2020 CLINICAL DATA:  Decreased breath sounds EXAM: CHEST - 2 VIEW COMPARISON:  07/03/2016 FINDINGS: Borderline cardiomegaly. No consolidation or effusion. Aortic atherosclerosis. No pneumothorax. IMPRESSION: No active cardiopulmonary disease. Borderline cardiomegaly.  Electronically Signed   By: Jasmine Pang M.D.   On: 02/21/2020 23:32   CT HEAD WO CONTRAST  Result Date: 02/21/2020 CLINICAL DATA:  69 year old female with neurologic deficit. EXAM: CT HEAD WITHOUT CONTRAST TECHNIQUE: Contiguous axial images were obtained from the base of the skull through the vertex without intravenous contrast. COMPARISON:  Head CT dated 07/17/2017. FINDINGS: Brain: Mild age-related atrophy and chronic microvascular ischemic changes. There is a moderate to large right MCA territory infarct, likely subacute. An acute infarct is not excluded correlation with clinical exam and further evaluation with MRI is recommended. There is no acute intracranial hemorrhage. No mass effect or midline shift. No extra-axial fluid collection. Vascular: No hyperdense vessel or unexpected calcification. Skull: Normal. Negative for fracture or focal lesion. Sinuses/Orbits: No acute finding. Other: None IMPRESSION: 1. No acute intracranial hemorrhage. 2. 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 An acute infarct is not excluded. Further evaluation with MRI is recommended. 3. Mild age-related atrophy and chronic microvascular ischemic changes. These results were called by telephone at the time of interpretation on 02/21/2020 at 8:58 pm to provider Geoffery Lyons , who verbally acknowledged these results. Electronically Signed   By: Elgie Collard M.D.   On: 02/21/2020 21:04   CT ANGIO NECK W OR WO CONTRAST  Result Date: 02/22/2020 CLINICAL DATA:  Episode of left hand and lips numbness and tingling, visual disturbance EXAM: CT ANGIOGRAPHY HEAD AND NECK TECHNIQUE: Multidetector CT imaging of the head and neck was performed using the standard protocol during bolus administration of intravenous contrast. Multiplanar CT image reconstructions and MIPs were obtained to evaluate the vascular anatomy. Carotid stenosis measurements (when applicable) are obtained utilizing NASCET criteria, using the  distal internal carotid diameter as the denominator. CONTRAST:  75mL OMNIPAQUE IOHEXOL 350 MG/ML SOLN COMPARISON:  Recent CT and MRI brain, MRA neck 2016 FINDINGS: CT HEAD Brain: There is no acute intracranial hemorrhage, mass effect, or edema. No new loss of gray-white differentiation. Tiny infarcts on prior MRI are not visible. Chronic right MCA territory infarct involving frontal lobe and insula. Mild ex vacuo dilatation of the adjacent right lateral ventricle. There is no extra-axial fluid collection. Ventricles are stable in size. Vascular: There is atherosclerotic calcification at the skull base. Skull: Calvarium is unremarkable. Sinuses/Orbits: No acute finding. Other: None. Review of the MIP images confirms the above findings CTA NECK Aortic arch: Mild calcified plaque at the patent great vessel origins. Right carotid system: Common carotid is patent with multifocal mild calcified plaque. Internal and external carotid arteries are patent. There is no measurable stenosis at the ICA origin. Left carotid system: Common carotid is patent with mild calcified plaque distally. Internal and external carotid arteries are patent. Heavily calcified plaque at the ICA origin limits stenosis measurement due to artifact but there is approximately 50% stenosis. Vertebral arteries: Patent.  Left vertebral artery is dominant. Skeleton: Multilevel degenerative changes of the cervical spine. Other neck: No mass or adenopathy. Upper chest: Emphysema. Review of the MIP images confirms the above findings CTA HEAD Anterior circulation: Intracranial internal carotid arteries are patent with calcified plaque causing mild stenosis. Anterior cerebral arteries are patent. Middle cerebral arteries are patent. Posterior circulation: Intracranial vertebral arteries patent with mild calcified  plaque on the left. Basilar artery is patent. Posterior cerebral arteries are patent. Venous sinuses: Patent as allowed by contrast bolus timing.  Review of the MIP images confirms the above findings IMPRESSION: No acute intracranial hemorrhage. Small infarcts on MRI are not visible. No large vessel occlusion. Calcified plaque at the left ICA origin causes approximately 50% stenosis. Electronically Signed   By: Guadlupe Spanish M.D.   On: 02/22/2020 12:55   MR BRAIN WO CONTRAST  Result Date: 02/21/2020 CLINICAL DATA:  69 year old female with neurologic deficit and suspected subacute right MCA infarct on plain head CT earlier tonight. EXAM: MRI HEAD WITHOUT CONTRAST TECHNIQUE: Multiplanar, multiecho pulse sequences of the brain and surrounding structures were obtained without intravenous contrast. COMPARISON:  Head CT 2047 hours today. FINDINGS: Brain: Corresponding to the CT abnormality there is abnormal T2 and FLAIR hyperintensity at the right frontal operculum, adjacent right insula. However, this is facilitated on ADC and isointense on trace diffusion. Mild associated hemosiderin on SWI. However, there are multiple punctate foci of cortical restricted diffusion scattered in the bilateral superior occipital lobes (series 5, image 77) and also in the posterior left temporal lobe. No other restricted diffusion identified; the posterior fossa appear spared. No other cortical encephalomalacia identified but there are occasional white matter foci most suggestive of chronic lacunar infarcts (right parietal lobe series 11, image 16). There is moderate T2 and FLAIR hyperintensity in the pons. Possible tiny chronic lacunar infarct in the medial right thalamus. And possible tiny chronic infarct in the right cerebellum. No midline shift, mass effect, evidence of mass lesion, ventriculomegaly, extra-axial collection or acute intracranial hemorrhage. Cervicomedullary junction and pituitary are within normal limits. Vascular: Major intracranial vascular flow voids are preserved. Skull and upper cervical spine: Negative for age visible cervical spine. Visualized bone  marrow signal is within normal limits. Sinuses/Orbits: Negative orbits. Paranasal sinuses and mastoids are stable and well pneumatized. Other: Visible internal auditory structures appear normal. Scalp and face soft tissues appear negative. IMPRESSION: 1. The right MCA territory infarct seen by CT tonight appears late subacute to early chronic by MRI. 2. However, there are scattered punctate acute infarcts in the bilateral occipital lobes (PCA territories) and posterior left temporal lobe (left MCA/PCA watershed). This pattern raises the possibility of a recent embolic event. 3. Mild hemosiderin associated with #1, but no acute hemorrhage or mass effect. 4. Otherwise mild to moderate for age underlying small vessel ischemia. Electronically Signed   By: Odessa Fleming M.D.   On: 02/21/2020 22:06   ECHOCARDIOGRAM COMPLETE  Result Date: 02/22/2020    ECHOCARDIOGRAM REPORT   Patient Name:   TARSHIA KOT [facility] Date of Exam: 02/22/2020 Medical Rec #:  [id number]              Height:       60.0 in Accession #:    [id number]             Weight:       187.4 lb Date of Birth:  [birth year]              BSA:          1.816 m Patient Age:    68 years               BP:           112/45 mmHg Patient Gender: F                      HR:  64 bpm. Exam Location:  Inpatient Procedure: 2D Echo, Cardiac Doppler and Color Doppler Indications:    TIA  History:        Patient has prior history of Echocardiogram examinations, most                 recent 01/19/2015. COPD; Risk Factors:Current Smoker,                 Hypertension and Dyslipidemia.  Sonographer:    Ross Ludwig RDCS (AE) Referring Phys: [phone] DEBBY CROSLEY  Sonographer Comments: Patient is morbidly obese and suboptimal parasternal window. IMPRESSIONS  1. Left ventricular ejection fraction, by estimation, is 60 to 65%. The left ventricle has normal function. The left ventricle has no regional wall motion abnormalities. There is moderate left ventricular hypertrophy. Left  ventricular diastolic parameters are indeterminate.  2. Right ventricular systolic function is normal. The right ventricular size is normal. Tricuspid regurgitation signal is inadequate for assessing PA pressure.  3. The mitral valve is grossly normal. No evidence of mitral valve regurgitation.  4. The aortic valve was not well visualized. Aortic valve regurgitation is not visualized. Mild to moderate aortic valve sclerosis/calcification is present, without any evidence of aortic stenosis.  5. The inferior vena cava is normal in size with greater than 50% respiratory variability, suggesting right atrial pressure of 3 mmHg. FINDINGS  Left Ventricle: Left ventricular ejection fraction, by estimation, is 60 to 65%. The left ventricle has normal function. The left ventricle has no regional wall motion abnormalities. The left ventricular internal cavity size was normal in size. There is  moderate left ventricular hypertrophy. Left ventricular diastolic parameters are indeterminate. Right Ventricle: The right ventricular size is normal. Right vetricular wall thickness was not assessed. Right ventricular systolic function is normal. Tricuspid regurgitation signal is inadequate for assessing PA pressure. Left Atrium: Left atrial size was normal in size. Right Atrium: Right atrial size was normal in size. Pericardium: Trivial pericardial effusion is present. Presence of pericardial fat pad. Mitral Valve: The mitral valve is grossly normal. No evidence of mitral valve regurgitation. MV peak gradient, 3.8 mmHg. The mean mitral valve gradient is 1.0 mmHg. Tricuspid Valve: The tricuspid valve is grossly normal. Tricuspid valve regurgitation is not demonstrated. Aortic Valve: The aortic valve was not well visualized. Aortic valve regurgitation is not visualized. Mild to moderate aortic valve sclerosis/calcification is present, without any evidence of aortic stenosis. Aortic valve mean gradient measures 4.0 mmHg.  Aortic valve  peak gradient measures 8.5 mmHg. Aortic valve area, by VTI measures 2.01 cm. Pulmonic Valve: The pulmonic valve was not well visualized. Pulmonic valve regurgitation is not visualized. Aorta: The aortic root is normal in size and structure. Venous: The inferior vena cava is normal in size with greater than 50% respiratory variability, suggesting right atrial pressure of 3 mmHg. IAS/Shunts: The interatrial septum was not well visualized.  LEFT VENTRICLE PLAX 2D LVIDd:         4.40 cm  Diastology LVIDs:         3.00 cm  LV e' lateral:   11.30 cm/s LV PW:         1.50 cm  LV E/e' lateral: 6.8 LV IVS:        1.40 cm  LV e' medial:    5.00 cm/s LVOT diam:     2.00 cm  LV E/e' medial:  15.3 LV SV:         66 LV SV Index:   37 LVOT Area:  3.14 cm  RIGHT VENTRICLE             IVC RV Basal diam:  3.00 cm     IVC diam: 1.40 cm RV S prime:     13.20 cm/s TAPSE (M-mode): 2.0 cm LEFT ATRIUM             Index       RIGHT ATRIUM           Index LA diam:        3.30 cm 1.82 cm/m  RA Area:     13.70 cm LA Vol (A2C):   42.0 ml 23.13 ml/m RA Volume:   29.90 ml  16.47 ml/m LA Vol (A4C):   32.6 ml 17.95 ml/m LA Biplane Vol: 37.4 ml 20.60 ml/m  AORTIC VALVE AV Area (Vmax):    2.01 cm AV Area (Vmean):   2.04 cm AV Area (VTI):     2.01 cm AV Vmax:           146.00 cm/s AV Vmean:          96.600 cm/s AV VTI:            0.329 m AV Peak Grad:      8.5 mmHg AV Mean Grad:      4.0 mmHg LVOT Vmax:         93.30 cm/s LVOT Vmean:        62.600 cm/s LVOT VTI:          0.211 m LVOT/AV VTI ratio: 0.64  AORTA Ao Root diam: 3.20 cm MITRAL VALVE MV Area (PHT): 2.48 cm    SHUNTS MV Peak grad:  3.8 mmHg    Systemic VTI:  0.21 m MV Mean grad:  1.0 mmHg    Systemic Diam: 2.00 cm MV Vmax:       0.97 m/s MV Vmean:      52.1 cm/s MV Decel Time: 306 msec MV E velocity: 76.30 cm/s MV A velocity: 91.70 cm/s MV E/A ratio:  0.83 Epifanio Lesches MD Electronically signed by Epifanio Lesches MD Signature Date/Time: 02/22/2020/10:33:19 AM     Final      Labs:   Basic Metabolic Panel: Recent Labs  Lab 02/21/20 2018 02/21/20 2018 02/21/20 2108 02/21/20 2108 02/22/20 0033 02/22/20 0317 02/23/20 0754  NA 139  --  143  --   --  138 141  K 3.5   < > 3.6   < >  --  3.3* 4.1  CL 100  --  99  --   --  101 105  CO2 28  --   --   --   --  26 29  GLUCOSE 103*  --  100*  --   --  110* 123*  BUN 7*  --  7*  --   --  7* 6*  CREATININE 0.70  --  0.70  --  0.68 0.71 0.75  CALCIUM 9.1  --   --   --   --  8.8* 8.8*  MG  --   --   --   --   --   --  1.7   < > = values in this interval not displayed.   GFR CrCl cannot be calculated (Unknown ideal weight.). Liver Function Tests: Recent Labs  Lab 02/21/20 2018 02/22/20 0317  AST 28 26  ALT 19 19  ALKPHOS 103 89  BILITOT 0.5 0.6  PROT 6.9 6.6  ALBUMIN 3.5 3.3*   No results  for input(s): LIPASE, AMYLASE in the last 168 hours. No results for input(s): AMMONIA in the last 168 hours. Coagulation profile Recent Labs  Lab 02/21/20 2018  INR 1.0    CBC: Recent Labs  Lab 02/21/20 2018 02/21/20 2108 02/22/20 0033 02/22/20 0317 02/23/20 0754  WBC 9.5  --  9.7 9.9 8.4  NEUTROABS 5.4  --   --   --   --   HGB 14.3 15.0 12.4 13.4 13.5  HCT 44.7 44.0 40.6 41.7 43.9  MCV 90.3  --  92.9 88.9 91.3  PLT 238  --  198 209 211   Cardiac Enzymes: No results for input(s): CKTOTAL, CKMB, CKMBINDEX, TROPONINI in the last 168 hours. BNP: Invalid input(s): POCBNP CBG: Recent Labs  Lab 02/21/20 2242  GLUCAP 105*   D-Dimer No results for input(s): DDIMER in the last 72 hours. Hgb A1c Recent Labs    02/22/20 0044  HGBA1C 6.0*   Lipid Profile Recent Labs    02/22/20 0317  CHOL 131  HDL 42  LDLCALC 70  TRIG 96  CHOLHDL 3.1   Thyroid function studies No results for input(s): TSH, T4TOTAL, T3FREE, THYROIDAB in the last 72 hours.  Invalid input(s): FREET3 Anemia work up No results for input(s): VITAMINB12, FOLATE, FERRITIN, TIBC, IRON, RETICCTPCT in the last 72  hours. Microbiology Recent Results (from the past 240 hour(s))  SARS Coronavirus 2 by RT PCR (hospital order, performed in [facility] hospital lab) Nasopharyngeal Nasopharyngeal Swab     Status: None   Collection Time: 02/22/20  8:47 AM   Specimen: Nasopharyngeal Swab  Result Value Ref Range Status   SARS Coronavirus 2 NEGATIVE NEGATIVE Final    Comment: (NOTE) SARS-CoV-2 target nucleic acids are NOT DETECTED.  The SARS-CoV-2 RNA is generally detectable in upper and lower respiratory specimens during the acute phase of infection. The lowest concentration of SARS-CoV-2 viral copies this assay can detect is 250 copies / mL. A negative result does not preclude SARS-CoV-2 infection and should not be used as the sole basis for treatment or other patient management decisions.  A negative result may occur with improper specimen collection / handling, submission of specimen other than nasopharyngeal swab, presence of viral mutation(s) within the areas targeted by this assay, and inadequate number of viral copies (<250 copies / mL). A negative result must be combined with clinical observations, patient history, and epidemiological information.  Fact Sheet for Patients:   BoilerBrush.com.cy  Fact Sheet for Healthcare Providers: https://pope.com/  This test is not yet approved or  cleared by the [place] FDA and has been authorized for detection and/or diagnosis of SARS-CoV-2 by FDA under an Emergency Use Authorization (EUA).  This EUA will remain in effect (meaning this test can be used) for the duration of the COVID-19 declaration under Section 564(b)(1) of the Act, 21 U.S.C. section 360bbb-3(b)(1), unless the authorization is terminated or revoked sooner.  Performed at [facility], [address] [place], [place] [address]      Discharge Instructions:   Discharge Instructions    Ambulatory referral to Neurology    Complete by: As directed    Follow up in stroke clinic at [facility] with Ihor Austin, NP in about 4 weeks. If not available, consider Dr. Delia Heady, Dr. Jamelle Rushing, or Dr. Naomie Dean.   Ambulatory referral to Physical Therapy   Complete by: As directed    Iontophoresis - 4 mg/ml of dexamethasone: No   T.E.N.S. Unit Evaluation and Dispense  as Indicated: No   Diet - low sodium heart healthy   Complete by: As directed    Discharge instructions   Complete by: As directed    Follow up with your primary care provider in one week. Follow up with [facility] as outpatient in 4 weeks and cardiology clinic on 03/07/20 for wound check up. Please keep incision clean and dry for 3 days. Please do not smoke. You have been prescribed nicotine patch to assist quitting smoking.   Increase activity slowly   Complete by: As directed      Allergies as of 02/23/2020      Reactions   Bupropion Other (See Comments)   Hallucinations Other reaction(s): Hallucinations   Septra [sulfamethoxazole-trimethoprim] Nausea And Vomiting   Vomiting   Sulfa Antibiotics Nausea And Vomiting   Zithromax [azithromycin] Hives   Cymbalta [duloxetine Hcl] Other (See Comments)   fatigue      Medication List    STOP taking these medications   ibuprofen 200 MG tablet Commonly known as: ADVIL     TAKE these medications   albuterol 108 (90 Base) MCG/ACT inhaler Commonly known as: Proventil HFA Inhale 1 puff into the lungs every 6 (six) hours as needed for wheezing or shortness of breath.   alendronate 70 MG tablet Commonly known as: FOSAMAX Take 70 mg by mouth every Monday.   aspirin 81 MG EC tablet Take 1 tablet (81 mg total) by mouth daily. Swallow whole. Start taking on: February 24, 2020   atorvastatin 40 MG tablet Commonly known as: LIPITOR Take 40 mg by mouth at bedtime.   CALCIUM PO Take 2 tablets by mouth daily at 12 noon.   clonazePAM 0.5 MG  tablet Commonly known as: KLONOPIN Take 1 mg by mouth every evening.   clopidogrel 75 MG tablet Commonly known as: PLAVIX Take 1 tablet (75 mg total) by mouth daily. Start taking on: February 24, 2020   cyclobenzaprine 10 MG tablet Commonly known as: FLEXERIL Take 0.5-1 tablets (5-10 mg total) by mouth 3 (three) times daily as needed for muscle spasms. What changed: when to take this   diphenhydrAMINE 25 MG tablet Commonly known as: BENADRYL Take 25 mg by mouth 2 (two) times daily as needed for itching or allergies.   FLUoxetine 20 MG capsule Commonly known as: PROZAC Take 60 mg by mouth daily.   furosemide 20 MG tablet Commonly known as: LASIX Take 20 mg by mouth every other day.   gabapentin 300 MG capsule Commonly known as: NEURONTIN Take 300-600 mg by mouth 3 (three) times daily. 300 mg twice daily. 600 mg at night   lisinopril 30 MG tablet Commonly known as: ZESTRIL Take 30 mg by mouth daily.   metoprolol tartrate 25 MG tablet Commonly known as: LOPRESSOR Take 25 mg by mouth 2 (two) times daily.   nicotine 21 mg/24hr patch Commonly known as: NICODERM CQ - dosed in mg/24 hours Place 1 patch (21 mg total) onto the skin daily. Start taking on: February 24, 2020   omeprazole 40 MG capsule Commonly known as: PRILOSEC Take 40 mg by mouth daily.   PATADAY OP Place 2 drops into both eyes daily as needed (Dry/itchy eyes).   tiZANidine 4 MG tablet Commonly known as: ZANAFLEX Take 4 mg by mouth 3 (three) times daily as needed for muscle spasms.   Trelegy Ellipta 100-62.5-25 MCG/INH Aepb Generic drug: Fluticasone-Umeclidin-Vilant Inhale 1 puff into the lungs daily.   VITAMIN C PO Take 1 tablet by mouth  daily at 12 noon.   VITAMIN D3 PO Take 1 tablet by mouth daily at 12 noon.       Follow-up Information    [facility] Follow up.   Specialty: Rehabilitation Contact information: [address] [id number] mc [place] [address] [phone]       [facility] Follow up in 4 week(s).   Specialty: Neurology Why: stroke clinic. office will call with appt date and time.  Contact information: [address] [place] [address] [phone]       [facility] Follow up.   Specialty: Cardiology Why: 03/07/2020 @ 12;00PM (noon), wound check visit Contact information: [address] [place] [address] [phone]               Time coordinating discharge: 39 minutes  Signed:  Dorismar Chay  [facility] 02/23/2020, 4:33 PM

## 2020-02-23 NOTE — Progress Notes (Signed)
SLP Cancellation Note  Patient Details Name: Norma Scott MRN: [id number] DOB: 05-12-1951   Cancelled treatment:       Reason Eval/Treat Not Completed: SLP screened, no needs identified, will sign off. Per medical record, the pt does not have any cognitive or communication impairments at this time. SLP will sign off.   Amram Maya I. Vear Clock, MS, CCC-SLP Acute Rehabilitation Services Office number [phone] Pager [phone]  Scheryl Marten 02/23/2020, 11:53 AM

## 2020-02-26 ENCOUNTER — Encounter (HOSPITAL_COMMUNITY): Payer: Self-pay | Admitting: Cardiology

## 2020-03-05 DIAGNOSIS — E7800 Pure hypercholesterolemia, unspecified: Secondary | ICD-10-CM | POA: Diagnosis not present

## 2020-03-05 DIAGNOSIS — F17210 Nicotine dependence, cigarettes, uncomplicated: Secondary | ICD-10-CM | POA: Diagnosis not present

## 2020-03-05 DIAGNOSIS — J449 Chronic obstructive pulmonary disease, unspecified: Secondary | ICD-10-CM | POA: Diagnosis not present

## 2020-03-05 DIAGNOSIS — Z09 Encounter for follow-up examination after completed treatment for conditions other than malignant neoplasm: Secondary | ICD-10-CM | POA: Diagnosis not present

## 2020-03-05 DIAGNOSIS — I63419 Cerebral infarction due to embolism of unspecified middle cerebral artery: Secondary | ICD-10-CM | POA: Diagnosis not present

## 2020-03-05 DIAGNOSIS — I10 Essential (primary) hypertension: Secondary | ICD-10-CM | POA: Diagnosis not present

## 2020-03-07 ENCOUNTER — Ambulatory Visit (INDEPENDENT_AMBULATORY_CARE_PROVIDER_SITE_OTHER): Payer: Medicare HMO | Admitting: Emergency Medicine

## 2020-03-07 ENCOUNTER — Other Ambulatory Visit: Payer: Self-pay

## 2020-03-07 DIAGNOSIS — I6523 Occlusion and stenosis of bilateral carotid arteries: Secondary | ICD-10-CM

## 2020-03-07 LAB — CUP PACEART INCLINIC DEVICE CHECK
Date Time Interrogation Session: 20210812120832
Implantable Pulse Generator Implant Date: 20210730

## 2020-03-07 NOTE — Progress Notes (Signed)
ILR wound check in clinic. Steri strips removed. Wound well healed. Home monitor transmitting nightly. 1 pause episode during implant. No other episodes noted. Questions answered. Next home remote 04/08/20.

## 2020-03-25 ENCOUNTER — Encounter: Payer: Self-pay | Admitting: Adult Health

## 2020-03-25 ENCOUNTER — Ambulatory Visit: Payer: Medicare HMO | Admitting: Adult Health

## 2020-03-25 VITALS — BP 144/67 | HR 65 | Ht 59.0 in | Wt 189.0 lb

## 2020-03-25 DIAGNOSIS — E785 Hyperlipidemia, unspecified: Secondary | ICD-10-CM

## 2020-03-25 DIAGNOSIS — Z720 Tobacco use: Secondary | ICD-10-CM

## 2020-03-25 DIAGNOSIS — I10 Essential (primary) hypertension: Secondary | ICD-10-CM | POA: Diagnosis not present

## 2020-03-25 DIAGNOSIS — I6522 Occlusion and stenosis of left carotid artery: Secondary | ICD-10-CM

## 2020-03-25 DIAGNOSIS — I639 Cerebral infarction, unspecified: Secondary | ICD-10-CM

## 2020-03-25 DIAGNOSIS — G4719 Other hypersomnia: Secondary | ICD-10-CM

## 2020-03-25 DIAGNOSIS — R29818 Other symptoms and signs involving the nervous system: Secondary | ICD-10-CM | POA: Diagnosis not present

## 2020-03-25 NOTE — Progress Notes (Signed)
[facility] [address] [place] [id number] [phone]       HOSPITAL FOLLOW UP NOTE  Ms. Norma Stanleyorma Crews Shanley Date of Birth:  04/13/1951 Medical Record Number:  [id number]   Reason for Referral:  hospital stroke follow up    SUBJECTIVE:   CHIEF COMPLAINT:  Chief Complaint  Patient presents with  . Follow-up    Accompanied by her son Norma Scott  . Cerebrovascular Accident    Recovered well without residual deficits    HPI:   [name] is a 69 y.o. female with history of CAD, COPD & tobacco abuse 1 to 1-1/2 ppd, fibromyalgia, hyperlipidemia, chronic back pain s/p surgeries, carotid stenosis status post R CEA many years ago, who presents on 02/21/2019 with blurred vision, L sided numbness, tingling. Reports palpitations.    Stroke work-up revealed bilateral MCA and MCA/PCA scattered punctate acute infarcts, embolic secondary to unknown source.  Loop recorder placed to assess for atrial fibrillation as possible etiology.  Recommended DAPT for 3 weeks and aspirin alone.  Prior history of stroke/TIA in 04/2015 and 06/2015. Hx of HTN stable with long-term BP goal normotensive range. Hx of HLD with LDL 70 and recommended increasing atorvastatin from 40 mg to 80 mg daily. Hx of R CEA with current imaging showing left ICA carotid stenosis and recommended [facility] follow-up outpatient for monitoring.  Current tobacco use with smoking cessation counseling provided.  Other stroke risk factors include advanced age, EtOH use, obesity, family history of stroke, CAD, PAD and PVD.  Evaluated by therapy and recommended discharge home with OP PT and OT.   Stroke:   bilateral MCA and MCA/PCA scattered punctate acute infarcts embolic secondary to unknown source  CT head No acute abnormality. Moderate to large R MCA territory infarct. Small vessel disease. Atrophy.   MRI  Subacute to early chronic R MCA territory infarct w/ mild hemosiderin. Scattered punctate acute  infarcts B occipital lobes, poster L temporal lobe. Small vessel disease. Atrophy.   CTA head & neck no LVO. L ICA origin 50% stenosis   Carotid doppler 01/03/2020 L 40-59% stenosis   LE Doppler  Neg DVT   2D Echo EF 60-65%  Loop recorder placement 7/30  LDL 70  HgbA1c 6.0  VTE prophylaxis - Lovenox 40 mg sq daily   No antithrombotic prior to admission, now on aspirin 81 mg and plavix 75 daily. Continue DAPT for 3 weeks and then ASA alone.  Therapy recommendations:  OP PT, OP OT, no SLP  Disposition:  Return home  Today, 03/25/2020, [name] is being seen for hospital follow-up accompanied by her son Norma Scott  She has recovered well from a stroke standpoint without residual deficits She does have chronic gait impairment but she reports due to lumbar conditions and currently at baseline She has returned back to all prior activities without difficulty Denies new or reoccurring stroke/TIA symptoms  Remains on DAPT despite 3-week recommendation but denies bleeding or bruising Continues on atorvastatin 40 mg daily without myalgias Blood pressure today 144/67 Loop recorder has not shown atrial fibrillation thus far Continue tobacco use but currently decreasing daily usage currently down to 5 cigarettes/day previously at 1 pack/day She does report daytime fatigue, snoring and occasional insomnia -she has not previously underwent sleep study  No concerns at this time      ROS:   14 system review of systems performed and negative with exception of fatigue, snoring  PMH:  Past Medical History:  Diagnosis Date  .  Allergy    allergic rhinitis  . Arteriosclerotic coronary artery disease 05/21/2015  . Asthma   . CAD (coronary artery disease)    patient says no problems   dr Williemae Natter smith pcp  . Carotid artery occlusion   . Cervical disc displacement    x2  . COPD (chronic obstructive pulmonary disease) (HCC)   . Esophageal reflux   . Essential hypertension 05/21/2015  .  Family history of adverse reaction to anesthesia    mother hard to wake and affected b/p  . Fibromyalgia   . Gall stones   . Generalized anxiety disorder   . Hyperlipidemia   . Lumbar disc herniation    L5-S1  . Mini stroke (HCC)   . TIA (transient ischemic attack) 05/21/2015    PSH:  Past Surgical History:  Procedure Laterality Date  . BACK SURGERY  06/01/2012   lumbar  . CAROTID ENDARTERECTOMY  02/01/2009   Right  . CERVICAL FUSION     1995, 1996  . CHOLECYSTECTOMY    . CHOLECYSTECTOMY    . ECTOPIC PREGNANCY SURGERY  93  . ERCP N/A 11/20/2015   Procedure: ENDOSCOPIC RETROGRADE CHOLANGIOPANCREATOGRAPHY (ERCP);  Surgeon: Willis Modena, MD;  Location: [facility];  Service: Endoscopy;  Laterality: N/A;  . EUS N/A 11/20/2015   Procedure: UPPER ENDOSCOPIC ULTRASOUND (EUS) RADIAL;  Surgeon: Willis Modena, MD;  Location: [facility];  Service: Endoscopy;  Laterality: N/A;  . LOOP RECORDER INSERTION N/A 02/23/2020   Procedure: LOOP RECORDER INSERTION;  Surgeon: Regan Lemming, MD;  Location: [facility];  Service: Cardiovascular;  Laterality: N/A;  . TUBAL LIGATION      Social History:  Social History   Socioeconomic History  . Marital status: Divorced    Spouse name: Not on file  . Number of children: Not on file  . Years of education: Not on file  . Highest education level: Not on file  Occupational History  . Not on file  Tobacco Use  . Smoking status: Current Some Day Smoker    Packs/day: 0.50    Years: 45.00    Pack years: 22.50    Types: Cigarettes  . Smokeless tobacco: Never Used  . Tobacco comment: states she only smokes about 1/2 pack  Substance and Sexual Activity  . Alcohol use: Yes    Alcohol/week: 0.0 standard drinks    Comment: 2 times a year  . Drug use: No  . Sexual activity: Not on file  Other Topics Concern  . Not on file  Social History Narrative   Epworth Sleepiness Scale = 10 (as of 05/21/2015)   Social Determinants of Health    Financial Resource Strain:   . Difficulty of Paying Living Expenses: Not on file  Food Insecurity:   . Worried About Programme researcher, broadcasting/film/video in the Last Year: Not on file  . Ran Out of Food in the Last Year: Not on file  Transportation Needs:   . Lack of Transportation (Medical): Not on file  . Lack of Transportation (Non-Medical): Not on file  Physical Activity:   . Days of Exercise per Week: Not on file  . Minutes of Exercise per Session: Not on file  Stress:   . Feeling of Stress : Not on file  Social Connections:   . Frequency of Communication with Friends and Family: Not on file  . Frequency of Social Gatherings with Friends and Family: Not on file  . Attends Religious Services: Not on file  . Active Member  of Clubs or Organizations: Not on file  . Attends Banker Meetings: Not on file  . Marital Status: Not on file  Intimate Partner Violence:   . Fear of Current or Ex-Partner: Not on file  . Emotionally Abused: Not on file  . Physically Abused: Not on file  . Sexually Abused: Not on file    Family History:  Family History  Problem Relation Age of Onset  . Stroke Mother   . Hypertension Mother   . Hyperlipidemia Mother   . Seizures Mother        Grand mal seizures  . Emphysema Mother   . Diabetes Father   . Heart disease Father        Heart Disease before age 14  . Stroke Maternal Grandmother   . Heart Problems Maternal Grandmother   . Cancer Maternal Grandfather        stomach cancer  . Heart attack Maternal Grandfather   . Heart disease Paternal Grandfather   . Heart attack Paternal Grandfather   . Deep vein thrombosis Brother   . Asthma Brother   . Cancer Paternal Grandmother 42  . Heart disease Paternal Uncle   . Heart attack Paternal Uncle     Medications:   Current Outpatient Medications on File Prior to Visit  Medication Sig Dispense Refill  . albuterol (PROVENTIL HFA) 108 (90 Base) MCG/ACT inhaler Inhale 1 puff into the lungs every 6  (six) hours as needed for wheezing or shortness of breath. 1 Inhaler 0  . alendronate (FOSAMAX) 70 MG tablet Take 70 mg by mouth every Monday.     . Ascorbic Acid (VITAMIN C PO) Take 1 tablet by mouth daily at 12 noon.    Marland Kitchen aspirin EC 81 MG EC tablet Take 1 tablet (81 mg total) by mouth daily. Swallow whole. 30 tablet 11  . atorvastatin (LIPITOR) 40 MG tablet Take 40 mg by mouth at bedtime.     Marland Kitchen CALCIUM PO Take 2 tablets by mouth daily at 12 noon.    . Cholecalciferol (VITAMIN D3 PO) Take 1 tablet by mouth daily at 12 noon.    . clonazePAM (KLONOPIN) 0.5 MG tablet Take 1 mg by mouth every evening.     . diphenhydrAMINE (BENADRYL) 25 MG tablet Take 25 mg by mouth 2 (two) times daily as needed for itching or allergies.    Marland Kitchen FLUoxetine (PROZAC) 20 MG capsule Take 60 mg by mouth daily.     . furosemide (LASIX) 20 MG tablet Take 20 mg by mouth every other day.     . gabapentin (NEURONTIN) 300 MG capsule Take 300-600 mg by mouth 3 (three) times daily. 300 mg twice daily. 600 mg at night    . lisinopril (ZESTRIL) 30 MG tablet Take 30 mg by mouth daily.     . metoprolol tartrate (LOPRESSOR) 25 MG tablet Take 25 mg by mouth 2 (two) times daily.    . nicotine (NICODERM CQ - DOSED IN MG/24 HOURS) 21 mg/24hr patch Place 1 patch (21 mg total) onto the skin daily. 28 patch 0  . Olopatadine HCl (PATADAY OP) Place 2 drops into both eyes daily as needed (Dry/itchy eyes).    Marland Kitchen omeprazole (PRILOSEC) 40 MG capsule Take 40 mg by mouth daily.     Marland Kitchen tiZANidine (ZANAFLEX) 4 MG tablet Take 4 mg by mouth 3 (three) times daily as needed for muscle spasms.    . TRELEGY ELLIPTA 100-62.5-25 MCG/INH AEPB Inhale 1 puff into the lungs  daily.     No current facility-administered medications on file prior to visit.    Allergies:   Allergies  Allergen Reactions  . Bupropion Other (See Comments)    Hallucinations Other reaction(s): Hallucinations  . Septra [Sulfamethoxazole-Trimethoprim] Nausea And Vomiting    Vomiting   . Sulfa Antibiotics Nausea And Vomiting  . Zithromax [Azithromycin] Hives  . Cymbalta [Duloxetine Hcl] Other (See Comments)    fatigue      OBJECTIVE:  Physical Exam  Vitals:   03/25/20 1515  BP: (!) 144/67  Pulse: 65  Weight: 189 lb (85.7 kg)  Height: 4\' 11"  (1.499 m)   Body mass index is 38.17 kg/m. No exam data present  General: well developed, well nourished, pleasant middle-age Caucasian female, seated, in no evident distress Head: head normocephalic and atraumatic.   Neck: supple with no carotid or supraclavicular bruits Cardiovascular: regular rate and rhythm, no murmurs Musculoskeletal: no deformity Skin:  no rash/petichiae Vascular:  Normal pulses all extremities   Neurologic Exam Mental Status: Awake and fully alert.   Fluent speech and language.  Oriented to place and time. Recent and remote memory intact. Attention span, concentration and fund of knowledge appropriate. Mood and affect appropriate.  Cranial Nerves: Fundoscopic exam reveals sharp disc margins. Pupils equal, briskly reactive to light. Extraocular movements full without nystagmus. Visual fields full to confrontation. Hearing intact. Facial sensation intact. Face, tongue, palate moves normally and symmetrically.  Motor: Normal bulk and tone. Normal strength in all tested extremity muscles. Sensory.: intact to touch , pinprick , position and vibratory sensation.  Coordination: Rapid alternating movements normal in all extremities. Finger-to-nose and heel-to-shin performed accurately bilaterally. Gait and Station: Arises from chair without difficulty. Stance is normal. Gait demonstrates normal stride length and balance without use of assistive device Reflexes: 1+ and symmetric. Toes downgoing.     NIHSS  0 Modified Rankin  0      ASSESSMENT: Delania Ferg is a 69 y.o. year old female presented with blurred vision, left-sided and tingling on 02/21/2020 with stroke work-up revealing  bilateral MCA and MCA/ACA scattered punctate acute infarcts, embolic secondary to unknown source therefore loop recorder placed. Vascular risk factors include CAD, tobacco use, left ICA 50% stenosis, hx R CEA, HTN, EtOH use, obesity, family history of stroke, PAD and PVD.      PLAN:  1. B MCA and MCA/PCA stroke, cryptogenic:  a. Recovered well without residual deficits.   b. Loop recorder has not shown atrial fibrillation thus far.  Personally reviewed monthly reports.   c. Continue aspirin 81 mg daily  and atorvastatin 40 mg daily for secondary stroke prevention.  Advised to discontinue Plavix as prolonged DAPT not indicated d. Close PCP follow up for aggressive stroke risk factor management  e. Discussed possible participation in 02/23/2020 trial -she is provided additional information by [facility] research team 2. HTN:  a. BP goal <130/90.   b. Stable.   c. Continue f/u with PCP 3. HLD:  a. LDL goal <70. Recent LDL 70.   b. Continue atorvastatin 40 mg daily.   c. F/u with PCP for management as well as prescribing of statin 4. Suspected sleep apnea:  a. Referral placed to [facility] sleep clinic for further evaluation.   b. Educated on increased risk of untreated sleep apnea in regards to recurrent stroke and cardiovascular disease 5. Carotid stenosis:  a. Routine follow-up with [facility] for monitoring and management 6. Tobacco use:  a. In the process of decreasing daily amount.  b. Highly encouraged to continue decreasing daily amount until complete cessation    Follow up in 4 months or call earlier if needed   I spent 45 minutes of face-to-face and non-face-to-face time with patient and son.  This included previsit chart review, lab review, study review, order entry, electronic health record documentation, patient education regarding recent stroke with unknown etiology and indication for loop recorder, suspected underlying sleep apnea, importance of managing stroke risk factors and answered all  questions to patient and sons satisfaction     Ihor Austin, AGNP-BC  [facility] [address] [place], [place] [address]  Phone [phone] Fax [phone] Note: This document was prepared with digital dictation and possible smart phrase technology. Any transcriptional errors that result from this process are unintentional.

## 2020-03-25 NOTE — Patient Instructions (Signed)
Referral placed to [facility] for possible underlying sleep apnea  Consider participation in [place] trial -you were provided information today -please call our research department if you have any additional questions or would like to further be evaluated to participate  Your loop recorder will continue to be monitored for possible atrial fibrillation  Continue to follow with vascular surgery for carotid stenosis monitoring and management  Continue aspirin 81 mg daily  and atorvastatin 40 mg daily for secondary stroke prevention  Discontinue Plavix as prolonged combination of both aspirin and Plavix not indicated for stroke prevention  Great job decreasing your daily tobacco intake!  Highly encourage decreasing daily amount until complete cessation is reached  Continue to follow up with PCP regarding cholesterol and blood pressure management  Maintain strict control of hypertension with blood pressure goal below 130/90 and cholesterol with LDL cholesterol (bad cholesterol) goal below 70 mg/dL.       Followup in the future with me in 4 months or call earlier if needed       Thank you for coming to see [place] at [facility]. I hope we have been able to provide you high quality care today.  You may receive a patient satisfaction survey over the next few weeks. We would appreciate your feedback and comments so that we may continue to improve ourselves and the health of our patients.    Stroke Prevention Some medical conditions and behaviors are associated with a higher chance of having a stroke. You can help prevent a stroke by making nutrition, lifestyle, and other changes, including managing any medical conditions you may have. What nutrition changes can be made?   Eat healthy foods. You can do this by: ? Choosing foods high in fiber, such as fresh fruits and vegetables and whole grains. ? Eating at least 5 or more servings of fruits and vegetables a day.  Try to fill half of your plate at each meal with fruits and vegetables. ? Choosing lean protein foods, such as lean cuts of meat, poultry without skin, fish, tofu, beans, and nuts. ? Eating low-fat dairy products. ? Avoiding foods that are high in salt (sodium). This can help lower blood pressure. ? Avoiding foods that have saturated fat, trans fat, and cholesterol. This can help prevent high cholesterol. ? Avoiding processed and premade foods.  Follow your health care provider's specific guidelines for losing weight, controlling high blood pressure (hypertension), lowering high cholesterol, and managing diabetes. These may include: ? Reducing your daily calorie intake. ? Limiting your daily sodium intake to 1,500 milligrams (mg). ? Using only healthy fats for cooking, such as olive oil, canola oil, or sunflower oil. ? Counting your daily carbohydrate intake. What lifestyle changes can be made?  Maintain a healthy weight. Talk to your health care provider about your ideal weight.  Get at least 30 minutes of moderate physical activity at least 5 days a week. Moderate activity includes brisk walking, biking, and swimming.  Do not use any products that contain nicotine or tobacco, such as cigarettes and e-cigarettes. If you need help quitting, ask your health care provider. It may also be helpful to avoid exposure to secondhand smoke.  Limit alcohol intake to no more than 1 drink a day for nonpregnant women and 2 drinks a day for men. One drink equals 12 oz of beer, 5 oz of wine, or 1 oz of hard liquor.  Stop any illegal drug use.  Avoid taking birth control pills. Talk to  your health care provider about the risks of taking birth control pills if: ? You are over 11 years old. ? You smoke. ? You get migraines. ? You have ever had a blood clot. What other changes can be made?  Manage your cholesterol levels. ? Eating a healthy diet is important for preventing high cholesterol. If  cholesterol cannot be managed through diet alone, you may also need to take medicines. ? Take any prescribed medicines to control your cholesterol as told by your health care provider.  Manage your diabetes. ? Eating a healthy diet and exercising regularly are important parts of managing your blood sugar. If your blood sugar cannot be managed through diet and exercise, you may need to take medicines. ? Take any prescribed medicines to control your diabetes as told by your health care provider.  Control your hypertension. ? To reduce your risk of stroke, try to keep your blood pressure below 130/80. ? Eating a healthy diet and exercising regularly are an important part of controlling your blood pressure. If your blood pressure cannot be managed through diet and exercise, you may need to take medicines. ? Take any prescribed medicines to control hypertension as told by your health care provider. ? Ask your health care provider if you should monitor your blood pressure at home. ? Have your blood pressure checked every year, even if your blood pressure is normal. Blood pressure increases with age and some medical conditions.  Get evaluated for sleep disorders (sleep apnea). Talk to your health care provider about getting a sleep evaluation if you snore a lot or have excessive sleepiness.  Take over-the-counter and prescription medicines only as told by your health care provider. Aspirin or blood thinners (antiplatelets or anticoagulants) may be recommended to reduce your risk of forming blood clots that can lead to stroke.  Make sure that any other medical conditions you have, such as atrial fibrillation or atherosclerosis, are managed. What are the warning signs of a stroke? The warning signs of a stroke can be easily remembered as BEFAST.  B is for balance. Signs include: ? Dizziness. ? Loss of balance or coordination. ? Sudden trouble walking.  E is for eyes. Signs include: ? A sudden  change in vision. ? Trouble seeing.  F is for face. Signs include: ? Sudden weakness or numbness of the face. ? The face or eyelid drooping to one side.  A is for arms. Signs include: ? Sudden weakness or numbness of the arm, usually on one side of the body.  S is for speech. Signs include: ? Trouble speaking (aphasia). ? Trouble understanding.  T is for time. ? These symptoms may represent a serious problem that is an emergency. Do not wait to see if the symptoms will go away. Get medical help right away. Call your local emergency services (911 in the [place] Do not drive yourself to the hospital.  Other signs of stroke may include: ? A sudden, severe headache with no known cause. ? Nausea or vomiting. ? Seizure. Where to find more information For more information, visit:  American Stroke Association: www.strokeassociation.org  National Stroke Association: www.stroke.org Summary  You can prevent a stroke by eating healthy, exercising, not smoking, limiting alcohol intake, and managing any medical conditions you may have.  Do not use any products that contain nicotine or tobacco, such as cigarettes and e-cigarettes. If you need help quitting, ask your health care provider. It may also be helpful to avoid exposure to secondhand smoke.  Remember BEFAST for warning signs of stroke. Get help right away if you or a loved one has any of these signs. This information is not intended to replace advice given to you by your health care provider. Make sure you discuss any questions you have with your health care provider. Document Revised: 06/25/2017 Document Reviewed: 08/18/2016 Elsevier Patient Education  2020 [employer].

## 2020-03-26 NOTE — Progress Notes (Signed)
I agree with the above plan 

## 2020-03-28 ENCOUNTER — Ambulatory Visit (INDEPENDENT_AMBULATORY_CARE_PROVIDER_SITE_OTHER): Payer: Medicare HMO | Admitting: *Deleted

## 2020-03-28 DIAGNOSIS — I6340 Cerebral infarction due to embolism of unspecified cerebral artery: Secondary | ICD-10-CM

## 2020-03-28 LAB — CUP PACEART REMOTE DEVICE CHECK
Date Time Interrogation Session: 20210901155103
Implantable Pulse Generator Implant Date: 20210730

## 2020-04-02 NOTE — Progress Notes (Signed)
Carelink Summary Report / Loop Recorder 

## 2020-04-18 ENCOUNTER — Other Ambulatory Visit (HOSPITAL_COMMUNITY)
Admission: RE | Admit: 2020-04-18 | Discharge: 2020-04-18 | Disposition: A | Discharge status: Home / Self Care | Payer: Medicare HMO | Source: Ambulatory Visit | Attending: Family Medicine | Admitting: Family Medicine

## 2020-04-18 ENCOUNTER — Other Ambulatory Visit: Payer: Self-pay | Admitting: Family Medicine

## 2020-04-18 DIAGNOSIS — Z1151 Encounter for screening for human papillomavirus (HPV): Secondary | ICD-10-CM | POA: Insufficient documentation

## 2020-04-18 DIAGNOSIS — Z1389 Encounter for screening for other disorder: Secondary | ICD-10-CM | POA: Diagnosis not present

## 2020-04-18 DIAGNOSIS — I739 Peripheral vascular disease, unspecified: Secondary | ICD-10-CM | POA: Diagnosis not present

## 2020-04-18 DIAGNOSIS — F411 Generalized anxiety disorder: Secondary | ICD-10-CM | POA: Diagnosis not present

## 2020-04-18 DIAGNOSIS — E7800 Pure hypercholesterolemia, unspecified: Secondary | ICD-10-CM | POA: Diagnosis not present

## 2020-04-18 DIAGNOSIS — K219 Gastro-esophageal reflux disease without esophagitis: Secondary | ICD-10-CM | POA: Diagnosis not present

## 2020-04-18 DIAGNOSIS — J449 Chronic obstructive pulmonary disease, unspecified: Secondary | ICD-10-CM | POA: Diagnosis not present

## 2020-04-18 DIAGNOSIS — F17210 Nicotine dependence, cigarettes, uncomplicated: Secondary | ICD-10-CM | POA: Diagnosis not present

## 2020-04-18 DIAGNOSIS — Z0000 Encounter for general adult medical examination without abnormal findings: Secondary | ICD-10-CM | POA: Diagnosis not present

## 2020-04-18 DIAGNOSIS — I10 Essential (primary) hypertension: Secondary | ICD-10-CM | POA: Diagnosis not present

## 2020-04-18 DIAGNOSIS — Z23 Encounter for immunization: Secondary | ICD-10-CM | POA: Diagnosis not present

## 2020-04-18 DIAGNOSIS — R609 Edema, unspecified: Secondary | ICD-10-CM | POA: Diagnosis not present

## 2020-04-18 DIAGNOSIS — Z124 Encounter for screening for malignant neoplasm of cervix: Secondary | ICD-10-CM | POA: Diagnosis not present

## 2020-04-19 LAB — CYTOLOGY - PAP
Comment: NEGATIVE
Diagnosis: NEGATIVE
High risk HPV: NEGATIVE

## 2020-04-25 ENCOUNTER — Institutional Professional Consult (permissible substitution): Payer: Medicare HMO | Admitting: Neurology

## 2020-04-25 ENCOUNTER — Encounter: Payer: Self-pay | Admitting: Adult Health

## 2020-04-29 ENCOUNTER — Ambulatory Visit (INDEPENDENT_AMBULATORY_CARE_PROVIDER_SITE_OTHER): Payer: Medicare HMO

## 2020-04-29 DIAGNOSIS — I6340 Cerebral infarction due to embolism of unspecified cerebral artery: Secondary | ICD-10-CM

## 2020-04-30 LAB — CUP PACEART REMOTE DEVICE CHECK
Date Time Interrogation Session: 20211004154738
Implantable Pulse Generator Implant Date: 20210730

## 2020-05-01 NOTE — Progress Notes (Signed)
Carelink Summary Report / Loop Recorder 

## 2020-05-15 DIAGNOSIS — M810 Age-related osteoporosis without current pathological fracture: Secondary | ICD-10-CM | POA: Diagnosis not present

## 2020-05-15 DIAGNOSIS — E7800 Pure hypercholesterolemia, unspecified: Secondary | ICD-10-CM | POA: Diagnosis not present

## 2020-05-15 DIAGNOSIS — I10 Essential (primary) hypertension: Secondary | ICD-10-CM | POA: Diagnosis not present

## 2020-05-15 DIAGNOSIS — J449 Chronic obstructive pulmonary disease, unspecified: Secondary | ICD-10-CM | POA: Diagnosis not present

## 2020-05-30 ENCOUNTER — Ambulatory Visit (INDEPENDENT_AMBULATORY_CARE_PROVIDER_SITE_OTHER): Payer: Medicare HMO

## 2020-05-30 DIAGNOSIS — I6340 Cerebral infarction due to embolism of unspecified cerebral artery: Secondary | ICD-10-CM | POA: Diagnosis not present

## 2020-06-02 LAB — CUP PACEART REMOTE DEVICE CHECK
Date Time Interrogation Session: 20211106154633
Implantable Pulse Generator Implant Date: 20210730

## 2020-06-03 NOTE — Progress Notes (Signed)
Carelink Summary Report / Loop Recorder 

## 2020-06-25 DIAGNOSIS — J449 Chronic obstructive pulmonary disease, unspecified: Secondary | ICD-10-CM | POA: Diagnosis not present

## 2020-06-25 DIAGNOSIS — M810 Age-related osteoporosis without current pathological fracture: Secondary | ICD-10-CM | POA: Diagnosis not present

## 2020-06-25 DIAGNOSIS — I10 Essential (primary) hypertension: Secondary | ICD-10-CM | POA: Diagnosis not present

## 2020-06-25 DIAGNOSIS — K219 Gastro-esophageal reflux disease without esophagitis: Secondary | ICD-10-CM | POA: Diagnosis not present

## 2020-06-25 DIAGNOSIS — E7800 Pure hypercholesterolemia, unspecified: Secondary | ICD-10-CM | POA: Diagnosis not present

## 2020-07-01 ENCOUNTER — Ambulatory Visit (INDEPENDENT_AMBULATORY_CARE_PROVIDER_SITE_OTHER): Payer: Medicare HMO

## 2020-07-01 DIAGNOSIS — I6340 Cerebral infarction due to embolism of unspecified cerebral artery: Secondary | ICD-10-CM

## 2020-07-03 LAB — CUP PACEART REMOTE DEVICE CHECK
Date Time Interrogation Session: 20211206000541
Implantable Pulse Generator Implant Date: 20210730

## 2020-07-09 DIAGNOSIS — K219 Gastro-esophageal reflux disease without esophagitis: Secondary | ICD-10-CM | POA: Diagnosis not present

## 2020-07-09 DIAGNOSIS — I10 Essential (primary) hypertension: Secondary | ICD-10-CM | POA: Diagnosis not present

## 2020-07-09 DIAGNOSIS — J449 Chronic obstructive pulmonary disease, unspecified: Secondary | ICD-10-CM | POA: Diagnosis not present

## 2020-07-09 DIAGNOSIS — M810 Age-related osteoporosis without current pathological fracture: Secondary | ICD-10-CM | POA: Diagnosis not present

## 2020-07-09 DIAGNOSIS — E7800 Pure hypercholesterolemia, unspecified: Secondary | ICD-10-CM | POA: Diagnosis not present

## 2020-07-11 NOTE — Progress Notes (Signed)
Carelink Summary Report / Loop Recorder 

## 2020-07-25 ENCOUNTER — Ambulatory Visit: Payer: Medicare HMO | Admitting: Adult Health

## 2020-07-30 ENCOUNTER — Ambulatory Visit: Payer: Medicare HMO | Admitting: Adult Health

## 2020-07-30 ENCOUNTER — Telehealth: Payer: Self-pay | Admitting: *Deleted

## 2020-07-30 NOTE — Telephone Encounter (Signed)
Pt called at 3:11pm to cancel her 3:15pm appt. States she did not have childcare for her grandchildren and will call back to reschedule.

## 2020-07-30 NOTE — Progress Notes (Deleted)
[facility] [address] [place]. [place] [address] [phone]       HOSPITAL FOLLOW UP NOTE  [name] Date of Birth:  04/24/1951 Medical Record Number:  [id number]   Reason for Referral:  hospital stroke follow up    SUBJECTIVE:   CHIEF COMPLAINT:  No chief complaint on file.   HPI:   Today, 07/30/2020, [name] returns for stroke follow-up.  She has been stable since prior visit without new or reoccurring stroke/TIA symptoms.  She remains on aspirin 81 mg daily and atorvastatin 40 mg daily for secondary stroke prevention of side effects.  Blood pressures today ***.  Loop recorder reports personally reviewed which has not shown atrial fibrillation thus far.  Tobacco use ***.  [facility] with appointment scheduled but unfortunately canceled by patient due to back pain and has not been yet rescheduled.     History provided for reference purposes only Initial visit 03/25/2020 JM: [name] is being seen for hospital follow-up accompanied by her son Norma Scott She has recovered well from a stroke standpoint without residual deficits She does have chronic gait impairment but she reports due to lumbar conditions and currently at baseline She has returned back to all prior activities without difficulty Denies new or reoccurring stroke/TIA symptoms Remains on DAPT despite 3-week recommendation but denies bleeding or bruising Continues on atorvastatin 40 mg daily without myalgias Blood pressure today 144/67 Loop recorder has not shown atrial fibrillation thus far Continue tobacco use but currently decreasing daily usage currently down to 5 cigarettes/day previously at 1 pack/day She does report daytime fatigue, snoring and occasional insomnia -she has not previously underwent sleep study No concerns at this time  Stroke admission 02/21/2020 [name] is a 70 y.o. female with history of CAD, COPD &  tobacco abuse 1 to 1-1/2 ppd, fibromyalgia, hyperlipidemia, chronic back pain s/p surgeries, carotid stenosis status post R CEA many years ago, who presents on 02/21/2020 with blurred vision, L sided numbness, tingling. Reports palpitations.    Stroke work-up revealed bilateral MCA and MCA/PCA scattered punctate acute infarcts, embolic secondary to unknown source.  Loop recorder placed to assess for atrial fibrillation as possible etiology.  Recommended DAPT for 3 weeks and aspirin alone.  Prior history of stroke/TIA in 04/2015 and 06/2015. Hx of HTN stable with long-term BP goal normotensive range. Hx of HLD with LDL 70 and recommended increasing atorvastatin from 40 mg to 80 mg daily. Hx of R CEA with current imaging showing left ICA carotid stenosis and recommended [facility] follow-up outpatient for monitoring.  Current tobacco use with smoking cessation counseling provided.  Other stroke risk factors include advanced age, EtOH use, obesity, family history of stroke, CAD, PAD and PVD.  Evaluated by therapy and recommended discharge home with OP PT and OT.   Stroke:   bilateral MCA and MCA/PCA scattered punctate acute infarcts embolic secondary to unknown source  CT head No acute abnormality. Moderate to large R MCA territory infarct. Small vessel disease. Atrophy.   MRI  Subacute to early chronic R MCA territory infarct w/ mild hemosiderin. Scattered punctate acute infarcts B occipital lobes, poster L temporal lobe. Small vessel disease. Atrophy.   CTA head & neck no LVO. L ICA origin 50% stenosis   Carotid doppler 01/03/2020 L 40-59% stenosis   LE Doppler  Neg DVT   2D Echo EF 60-65%  Loop recorder placement 7/30  LDL 70  HgbA1c 6.0  VTE prophylaxis -  Lovenox 40 mg sq daily   No antithrombotic prior to admission, now on aspirin 81 mg and plavix 75 daily. Continue DAPT for 3 weeks and then ASA alone.  Therapy recommendations:  OP PT, OP OT, no SLP  Disposition:  Return home       ROS:    14 system review of systems performed and negative with exception of fatigue, snoring  PMH:  Past Medical History:  Diagnosis Date  . Allergy    allergic rhinitis  . Arteriosclerotic coronary artery disease 05/21/2015  . Asthma   . CAD (coronary artery disease)    patient says no problems   dr Williemae Natter smith pcp  . Carotid artery occlusion   . Cervical disc displacement    x2  . COPD (chronic obstructive pulmonary disease) (HCC)   . Esophageal reflux   . Essential hypertension 05/21/2015  . Family history of adverse reaction to anesthesia    mother hard to wake and affected b/p  . Fibromyalgia   . Gall stones   . Generalized anxiety disorder   . Hyperlipidemia   . Lumbar disc herniation    L5-S1  . Mini stroke (HCC)   . TIA (transient ischemic attack) 05/21/2015    PSH:  Past Surgical History:  Procedure Laterality Date  . BACK SURGERY  06/01/2012   lumbar  . CAROTID ENDARTERECTOMY  02/01/2009   Right  . CERVICAL FUSION     1995, 1996  . CHOLECYSTECTOMY    . CHOLECYSTECTOMY    . ECTOPIC PREGNANCY SURGERY  89  . ERCP N/A 11/20/2015   Procedure: ENDOSCOPIC RETROGRADE CHOLANGIOPANCREATOGRAPHY (ERCP);  Surgeon: Willis Modena, MD;  Location: [facility];  Service: Endoscopy;  Laterality: N/A;  . EUS N/A 11/20/2015   Procedure: UPPER ENDOSCOPIC ULTRASOUND (EUS) RADIAL;  Surgeon: Willis Modena, MD;  Location: [facility];  Service: Endoscopy;  Laterality: N/A;  . LOOP RECORDER INSERTION N/A 02/23/2020   Procedure: LOOP RECORDER INSERTION;  Surgeon: Regan Lemming, MD;  Location: [facility];  Service: Cardiovascular;  Laterality: N/A;  . TUBAL LIGATION      Social History:  Social History   Socioeconomic History  . Marital status: Divorced    Spouse name: Not on file  . Number of children: Not on file  . Years of education: Not on file  . Highest education level: Not on file  Occupational History  . Not on file  Tobacco Use  . Smoking status:  Current Some Day Smoker    Packs/day: 0.50    Years: 45.00    Pack years: 22.50    Types: Cigarettes  . Smokeless tobacco: Never Used  . Tobacco comment: states she only smokes about 1/2 pack  Substance and Sexual Activity  . Alcohol use: Yes    Alcohol/week: 0.0 standard drinks    Comment: 2 times a year  . Drug use: No  . Sexual activity: Not on file  Other Topics Concern  . Not on file  Social History Narrative   Epworth Sleepiness Scale = 10 (as of 05/21/2015)   Social Determinants of Health   Financial Resource Strain: Not on file  Food Insecurity: Not on file  Transportation Needs: Not on file  Physical Activity: Not on file  Stress: Not on file  Social Connections: Not on file  Intimate Partner Violence: Not on file    Family History:  Family History  Problem Relation Age of Onset  . Stroke Mother   . Hypertension Mother   .  Hyperlipidemia Mother   . Seizures Mother        Grand mal seizures  . Emphysema Mother   . Diabetes Father   . Heart disease Father        Heart Disease before age 62  . Stroke Maternal Grandmother   . Heart Problems Maternal Grandmother   . Cancer Maternal Grandfather        stomach cancer  . Heart attack Maternal Grandfather   . Heart disease Paternal Grandfather   . Heart attack Paternal Grandfather   . Deep vein thrombosis Brother   . Asthma Brother   . Cancer Paternal Grandmother 56  . Heart disease Paternal Uncle   . Heart attack Paternal Uncle     Medications:   Current Outpatient Medications on File Prior to Visit  Medication Sig Dispense Refill  . albuterol (PROVENTIL HFA) 108 (90 Base) MCG/ACT inhaler Inhale 1 puff into the lungs every 6 (six) hours as needed for wheezing or shortness of breath. 1 Inhaler 0  . alendronate (FOSAMAX) 70 MG tablet Take 70 mg by mouth every Monday.     . Ascorbic Acid (VITAMIN C PO) Take 1 tablet by mouth daily at 12 noon.    Marland Kitchen aspirin EC 81 MG EC tablet Take 1 tablet (81 mg total) by  mouth daily. Swallow whole. 30 tablet 11  . atorvastatin (LIPITOR) 40 MG tablet Take 40 mg by mouth at bedtime.     Marland Kitchen CALCIUM PO Take 2 tablets by mouth daily at 12 noon.    . Cholecalciferol (VITAMIN D3 PO) Take 1 tablet by mouth daily at 12 noon.    . clonazePAM (KLONOPIN) 0.5 MG tablet Take 1 mg by mouth every evening.     . diphenhydrAMINE (BENADRYL) 25 MG tablet Take 25 mg by mouth 2 (two) times daily as needed for itching or allergies.    Marland Kitchen FLUoxetine (PROZAC) 20 MG capsule Take 60 mg by mouth daily.     . furosemide (LASIX) 20 MG tablet Take 20 mg by mouth every other day.     . gabapentin (NEURONTIN) 300 MG capsule Take 300-600 mg by mouth 3 (three) times daily. 300 mg twice daily. 600 mg at night    . lisinopril (ZESTRIL) 30 MG tablet Take 30 mg by mouth daily.     . metoprolol tartrate (LOPRESSOR) 25 MG tablet Take 25 mg by mouth 2 (two) times daily.    . nicotine (NICODERM CQ - DOSED IN MG/24 HOURS) 21 mg/24hr patch Place 1 patch (21 mg total) onto the skin daily. 28 patch 0  . Olopatadine HCl (PATADAY OP) Place 2 drops into both eyes daily as needed (Dry/itchy eyes).    Marland Kitchen omeprazole (PRILOSEC) 40 MG capsule Take 40 mg by mouth daily.     Marland Kitchen tiZANidine (ZANAFLEX) 4 MG tablet Take 4 mg by mouth 3 (three) times daily as needed for muscle spasms.    . TRELEGY ELLIPTA 100-62.5-25 MCG/INH AEPB Inhale 1 puff into the lungs daily.     No current facility-administered medications on file prior to visit.    Allergies:   Allergies  Allergen Reactions  . Bupropion Other (See Comments)    Hallucinations Other reaction(s): Hallucinations  . Septra [Sulfamethoxazole-Trimethoprim] Nausea And Vomiting    Vomiting  . Sulfa Antibiotics Nausea And Vomiting  . Zithromax [Azithromycin] Hives  . Cymbalta [Duloxetine Hcl] Other (See Comments)    fatigue      OBJECTIVE:  Physical Exam  There were no vitals  filed for this visit. There is no height or weight on file to calculate BMI. No  exam data present  General: well developed, well nourished, pleasant middle-age Caucasian female, seated, in no evident distress Head: head normocephalic and atraumatic.   Neck: supple with no carotid or supraclavicular bruits Cardiovascular: regular rate and rhythm, no murmurs Musculoskeletal: no deformity Skin:  no rash/petichiae Vascular:  Normal pulses all extremities   Neurologic Exam Mental Status: Awake and fully alert.   Fluent speech and language.  Oriented to place and time. Recent and remote memory intact. Attention span, concentration and fund of knowledge appropriate. Mood and affect appropriate.  Cranial Nerves: Pupils equal, briskly reactive to light. Extraocular movements full without nystagmus. Visual fields full to confrontation. Hearing intact. Facial sensation intact. Face, tongue, palate moves normally and symmetrically.  Motor: Normal bulk and tone. Normal strength in all tested extremity muscles. Sensory.: intact to touch , pinprick , position and vibratory sensation.  Coordination: Rapid alternating movements normal in all extremities. Finger-to-nose and heel-to-shin performed accurately bilaterally. Gait and Station: Arises from chair without difficulty. Stance is normal. Gait demonstrates normal stride length and balance without use of assistive device Reflexes: 1+ and symmetric. Toes downgoing.         ASSESSMENT: Norma Scott is a 70 y.o. year old female presented with blurred vision, left-sided and tingling on 02/21/2020 with stroke work-up revealing bilateral MCA and MCA/ACA scattered punctate acute infarcts, embolic secondary to unknown source therefore loop recorder placed. Vascular risk factors include CAD, tobacco use, left ICA 50% stenosis, hx R CEA, HTN, EtOH use, obesity, family history of stroke, PAD and PVD.      PLAN:  1. B MCA and MCA/PCA stroke, cryptogenic:  a. Recovered well without residual deficits.   b. Loop recorder has not  shown atrial fibrillation thus far.  Personally reviewed monthly reports.   c. Continue aspirin 81 mg daily  and atorvastatin 40 mg daily for secondary stroke prevention.  d. Discussed secondary stroke prevention measures and importance of close PCP follow up for aggressive stroke risk factor management  2. HTN:  a. BP goal <130/90.  On furosemide, lisinopril and metoprolol per PCP 3. HLD:  a. LDL goal <70.  LDL 73 (04/18/2020).  On atorvastatin 40 mg daily per PCP.   4. Suspected sleep apnea:  a. Referral placed to [facility] sleep clinic for further evaluation.   b. Educated on increased risk of untreated sleep apnea in regards to recurrent stroke and cardiovascular disease 5. Carotid stenosis:  a. Routine follow-up with [facility] for monitoring and management 6. Tobacco use:  a. In the process of decreasing daily amount.   b. Highly encouraged to continue decreasing daily amount until complete cessation    Follow up in 4 months or call earlier if needed   I spent 30 minutes of face-to-face and non-face-to-face time with patient and son.  This included previsit chart review, lab review, study review, order entry, electronic health record documentation, patient education regarding recent stroke with unknown etiology and indication for loop recorder, suspected underlying sleep apnea, importance of managing stroke risk factors and answered all questions to patient and sons satisfaction   Ihor Austin, AGNP-BC  [facility] [address] [place], [place] [address]  Phone [phone] Fax [phone] Note: This document was prepared with digital dictation and possible smart phrase technology. Any transcriptional errors that result from this process are unintentional.

## 2020-08-01 ENCOUNTER — Ambulatory Visit (INDEPENDENT_AMBULATORY_CARE_PROVIDER_SITE_OTHER): Payer: Medicare HMO

## 2020-08-01 DIAGNOSIS — I6340 Cerebral infarction due to embolism of unspecified cerebral artery: Secondary | ICD-10-CM

## 2020-08-06 DIAGNOSIS — Z23 Encounter for immunization: Secondary | ICD-10-CM | POA: Diagnosis not present

## 2020-08-06 DIAGNOSIS — I10 Essential (primary) hypertension: Secondary | ICD-10-CM | POA: Diagnosis not present

## 2020-08-09 LAB — CUP PACEART REMOTE DEVICE CHECK
Date Time Interrogation Session: 20220108000213
Implantable Pulse Generator Implant Date: 20210730

## 2020-08-15 NOTE — Progress Notes (Signed)
Carelink Summary Report / Loop Recorder 

## 2020-08-22 DIAGNOSIS — J449 Chronic obstructive pulmonary disease, unspecified: Secondary | ICD-10-CM | POA: Diagnosis not present

## 2020-08-22 DIAGNOSIS — Z6841 Body Mass Index (BMI) 40.0 and over, adult: Secondary | ICD-10-CM | POA: Diagnosis not present

## 2020-08-22 DIAGNOSIS — I10 Essential (primary) hypertension: Secondary | ICD-10-CM | POA: Diagnosis not present

## 2020-09-02 ENCOUNTER — Ambulatory Visit (INDEPENDENT_AMBULATORY_CARE_PROVIDER_SITE_OTHER): Payer: Medicare HMO

## 2020-09-02 DIAGNOSIS — I6340 Cerebral infarction due to embolism of unspecified cerebral artery: Secondary | ICD-10-CM

## 2020-09-05 LAB — CUP PACEART REMOTE DEVICE CHECK
Date Time Interrogation Session: 20220210000559
Implantable Pulse Generator Implant Date: 20210730

## 2020-09-09 NOTE — Progress Notes (Signed)
Carelink Summary Report / Loop Recorder 

## 2020-09-12 DIAGNOSIS — E7800 Pure hypercholesterolemia, unspecified: Secondary | ICD-10-CM | POA: Diagnosis not present

## 2020-09-12 DIAGNOSIS — J449 Chronic obstructive pulmonary disease, unspecified: Secondary | ICD-10-CM | POA: Diagnosis not present

## 2020-09-12 DIAGNOSIS — I10 Essential (primary) hypertension: Secondary | ICD-10-CM | POA: Diagnosis not present

## 2020-09-12 DIAGNOSIS — K219 Gastro-esophageal reflux disease without esophagitis: Secondary | ICD-10-CM | POA: Diagnosis not present

## 2020-09-12 DIAGNOSIS — M810 Age-related osteoporosis without current pathological fracture: Secondary | ICD-10-CM | POA: Diagnosis not present

## 2020-10-03 ENCOUNTER — Ambulatory Visit (INDEPENDENT_AMBULATORY_CARE_PROVIDER_SITE_OTHER): Payer: Medicare HMO

## 2020-10-03 DIAGNOSIS — I6340 Cerebral infarction due to embolism of unspecified cerebral artery: Secondary | ICD-10-CM

## 2020-10-08 LAB — CUP PACEART REMOTE DEVICE CHECK
Date Time Interrogation Session: 20220315000322
Implantable Pulse Generator Implant Date: 20210730

## 2020-10-11 NOTE — Progress Notes (Signed)
Carelink Summary Report / Loop Recorder 

## 2020-10-15 ENCOUNTER — Ambulatory Visit: Payer: Medicare HMO | Admitting: Pulmonary Disease

## 2020-10-15 ENCOUNTER — Other Ambulatory Visit: Payer: Self-pay

## 2020-10-15 ENCOUNTER — Encounter: Payer: Self-pay | Admitting: Pulmonary Disease

## 2020-10-15 VITALS — BP 116/74 | HR 72 | Temp 97.4°F | Ht 59.0 in | Wt 191.4 lb

## 2020-10-15 DIAGNOSIS — R0683 Snoring: Secondary | ICD-10-CM | POA: Diagnosis not present

## 2020-10-15 DIAGNOSIS — J449 Chronic obstructive pulmonary disease, unspecified: Secondary | ICD-10-CM

## 2020-10-15 DIAGNOSIS — Z720 Tobacco use: Secondary | ICD-10-CM

## 2020-10-15 MED ORDER — ALBUTEROL SULFATE (2.5 MG/3ML) 0.083% IN NEBU: 2.5000 mg | INHALATION_SOLUTION | Freq: Four times a day (QID) | RESPIRATORY_TRACT | 3 refills | Status: DC | PRN

## 2020-10-15 MED ORDER — VARENICLINE TARTRATE 0.5 MG X 11 & 1 MG X 42 PO MISC: ORAL | 0 refills | Status: DC

## 2020-10-15 NOTE — Progress Notes (Signed)
Synopsis: Referred in March 2022 by Dr. Merri Brunette for COPD  Subjective:   PATIENT ID: Norma Scott, Norma Scott   HPI  Chief Complaint  Patient presents with   Consult    Referred by PCP for COPD. Was diagnosed with COPD back in 2017. States she has a productive cough with clear mucus. Increased SOB. Has used O2 in the past but denies being a current user.    Norma Scott is a 70 year old woman, daily smoker with history of coronary artery disease, GERD, stroke and COPD who is referred to pulmonary clinic for evaluation of COPD.   She is currently using trelegy ellipta 100 and has been on this medication for a couple years. She has noted benefit from its use. She is using albuterol as needed, mainly when she has exacerbations.   She reports having seasonal allergies that can be worse in the spring time. She does have sinus congestion and drainage.   She does have GERD but this is fairly well controlled on omeprazole.   She does have daily cough with whitish sputum production.   She continues to smoke 7 cigarettes daily. She is wearing a 21mg  nicotine patch which has helped her cut back. She previously liked chantix as this helped her cut back to 3 cigarettes daily. She has been smoking since age 79 and has smoked up to 2 packs per day. She has over a 30 pack year smoking history.   She has had swelling of her lower extremities, right greater than left in which she had lower extremity 14 01/2020 which was negative for DVT.   She has trouble being physically active due to her back pain.   She reports occasionally waking up short of breath. She has history of snoring.   Past Medical History:  Diagnosis Date   Allergy    allergic rhinitis   Arteriosclerotic coronary artery disease 05/21/2015   Asthma    CAD (coronary artery disease)    patient says no problems   dr 05/23/2015 pcp   Carotid artery occlusion     Cervical disc displacement    x2   COPD (chronic obstructive pulmonary disease) (HCC)    Esophageal reflux    Essential hypertension 05/21/2015   Family history of adverse reaction to anesthesia    mother hard to wake and affected b/p   Fibromyalgia    Gall stones    Generalized anxiety disorder    Hyperlipidemia    Lumbar disc herniation    L5-S1   Mini stroke (HCC)    TIA (transient ischemic attack) 05/21/2015     Family History  Problem Relation Age of Onset   Stroke Mother    Hypertension Mother    Hyperlipidemia Mother    Seizures Mother        Grand mal seizures   Emphysema Mother    Diabetes Father    Heart disease Father        Heart Disease before age 63   Stroke Maternal Grandmother    Heart Problems Maternal Grandmother    Cancer Maternal Grandfather        stomach cancer   Heart attack Maternal Grandfather    Heart disease Paternal Grandfather    Heart attack Paternal Grandfather    Deep vein thrombosis Brother    Asthma Brother    Cancer Paternal Grandmother 62   Heart disease Paternal Uncle    Heart attack Paternal Uncle  Social History   Socioeconomic History   Marital status: Divorced    Spouse name: Not on file   Number of children: Not on file   Years of education: Not on file   Highest education level: Not on file  Occupational History   Not on file  Tobacco Use   Smoking status: Current Some Day Smoker    Packs/day: 0.50    Years: 45.00    Pack years: 22.50    Types: Cigarettes   Smokeless tobacco: Never Used   Tobacco comment: smokes about 5-6 cigarettes daily   Substance and Sexual Activity   Alcohol use: Yes    Alcohol/week: 0.0 standard drinks    Comment: 2 times a year   Drug use: No   Sexual activity: Not on file  Other Topics Concern   Not on file  Social History Narrative   Epworth Sleepiness Scale = 10 (as of 05/21/2015)   Social Determinants of Health   Financial  Resource Strain: Not on file  Food Insecurity: Not on file  Transportation Needs: Not on file  Physical Activity: Not on file  Stress: Not on file  Social Connections: Not on file  Intimate Partner Violence: Not on file     Allergies  Allergen Reactions   Bupropion Other (See Comments)    Hallucinations Other reaction(s): Hallucinations   Septra [Sulfamethoxazole-Trimethoprim] Nausea And Vomiting    Vomiting   Sulfa Antibiotics Nausea And Vomiting   Zithromax [Azithromycin] Hives   Cymbalta [Duloxetine Hcl] Other (See Comments)    fatigue     Outpatient Medications Prior to Visit  Medication Sig Dispense Refill   albuterol (PROVENTIL HFA) 108 (90 Base) MCG/ACT inhaler Inhale 1 puff into the lungs every 6 (six) hours as needed for wheezing or shortness of breath. 1 Inhaler 0   alendronate (FOSAMAX) 70 MG tablet Take 70 mg by mouth every Monday.      Ascorbic Acid (VITAMIN C PO) Take 1 tablet by mouth daily at 12 noon.     aspirin EC 81 MG EC tablet Take 1 tablet (81 mg total) by mouth daily. Swallow whole. 30 tablet 11   atorvastatin (LIPITOR) 40 MG tablet Take 40 mg by mouth at bedtime.      CALCIUM PO Take 2 tablets by mouth daily at 12 noon.     Cholecalciferol (VITAMIN D3 PO) Take 1 tablet by mouth daily at 12 noon.     clonazePAM (KLONOPIN) 0.5 MG tablet Take 1 mg by mouth every evening.      diphenhydrAMINE (BENADRYL) 25 MG tablet Take 25 mg by mouth 2 (two) times daily as needed for itching or allergies.     FLUoxetine (PROZAC) 20 MG capsule Take 60 mg by mouth daily.      furosemide (LASIX) 20 MG tablet Take 20 mg by mouth every other day.      gabapentin (NEURONTIN) 300 MG capsule Take 300-600 mg by mouth 3 (three) times daily. 300 mg twice daily. 600 mg at night     lisinopril (ZESTRIL) 30 MG tablet Take 30 mg by mouth daily.      metoprolol tartrate (LOPRESSOR) 25 MG tablet Take 25 mg by mouth 2 (two) times daily.     nicotine (NICODERM CQ - DOSED  IN MG/24 HOURS) 21 mg/24hr patch Place 1 patch (21 mg total) onto the skin daily. 28 patch 0   Olopatadine HCl (PATADAY OP) Place 2 drops into both eyes daily as needed (Dry/itchy eyes).  omeprazole (PRILOSEC) 40 MG capsule Take 40 mg by mouth daily.      tiZANidine (ZANAFLEX) 4 MG tablet Take 4 mg by mouth 3 (three) times daily as needed for muscle spasms.     TRELEGY ELLIPTA 100-62.5-25 MCG/INH AEPB Inhale 1 puff into the lungs daily.     No facility-administered medications prior to visit.    Review of Systems  Constitutional: Negative for chills, fever, malaise/fatigue and weight loss.  HENT: Negative for congestion, sinus pain and sore throat.   Eyes: Negative.   Respiratory: Positive for cough, sputum production and shortness of breath. Negative for hemoptysis and wheezing.   Cardiovascular: Negative for chest pain, palpitations, orthopnea, claudication and leg swelling.  Gastrointestinal: Negative for abdominal pain, heartburn, nausea and vomiting.  Genitourinary: Negative.   Musculoskeletal: Negative for joint pain and myalgias.  Skin: Negative for rash.  Neurological: Negative for weakness.  Endo/Heme/Allergies: Negative.   Psychiatric/Behavioral: Negative.       Objective:   Vitals:   10/15/20 1032  BP: 116/74  Pulse: 72  Temp: (!) 97.4 F (36.3 C)  TempSrc: Temporal  SpO2: 96%  Weight: 191 lb 6.4 oz (86.8 kg)  Height:  (1.499 m)     Physical Exam Constitutional:      General: She is not in acute distress.    Appearance: She is not ill-appearing.  HENT:     Head: Normocephalic and atraumatic.     Nose: Nose normal.     Mouth/Throat:     Mouth: Mucous membranes are moist.     Pharynx: Oropharynx is clear.  Eyes:     General: No scleral icterus.    Conjunctiva/sclera: Conjunctivae normal.     Pupils: Pupils are equal, round, and reactive to light.  Cardiovascular:     Rate and Rhythm: Normal rate and regular rhythm.     Pulses: Normal  pulses.     Heart sounds: Normal heart sounds. No murmur heard.   Pulmonary:     Effort: Pulmonary effort is normal.     Breath sounds: Rhonchi (left mid lung field) present. No wheezing or rales.  Abdominal:     General: Bowel sounds are normal.     Palpations: Abdomen is soft.  Musculoskeletal:     Right lower leg: No edema.     Left lower leg: No edema.  Lymphadenopathy:     Cervical: No cervical adenopathy.  Skin:    General: Skin is warm and dry.  Neurological:     General: No focal deficit present.     Mental Status: She is alert.  Psychiatric:        Mood and Affect: Mood normal.        Behavior: Behavior normal.        Thought Content: Thought content normal.        Judgment: Judgment normal.    CBC    Component Value Date/Time   WBC 8.4 02/23/2020 0754   RBC 4.81 02/23/2020 0754   HGB 13.5 02/23/2020 0754   HCT 43.9 02/23/2020 0754   PLT 211 02/23/2020 0754   MCV 91.3 02/23/2020 0754   MCH 28.1 02/23/2020 0754   MCHC 30.8 02/23/2020 0754   RDW 12.7 02/23/2020 0754   LYMPHSABS 3.0 02/21/2020 2018   MONOABS 0.7 02/21/2020 2018   EOSABS 0.2 02/21/2020 2018   BASOSABS 0.1 02/21/2020 2018   BMP Latest Ref Rng & Units 02/23/2020 02/22/2020 02/22/2020  Glucose 70 - 99 mg/dL 161(W) 960(A) -  BUN  8 - 23 mg/dL 6(L) 7(L) -  Creatinine 0.44 - 1.00 mg/dL 1.910.75 4.780.71 2.950.68  Sodium 135 - 145 mmol/L 141 138 -  Potassium 3.5 - 5.1 mmol/L 4.1 3.3(L) -  Chloride 98 - 111 mmol/L 105 101 -  CO2 22 - 32 mmol/L 29 26 -  Calcium 8.9 - 10.3 mg/dL 6.2(Z8.8(L) 3.0(Q8.8(L) -   Chest imaging: CXR 02/21/20 Borderline cardiomegaly. No consolidation or effusion. Aortic atherosclerosis. No pneumothorax.  PFT: No flowsheet data found.  Labs:  Path:  Echo 02/22/20: 1. Left ventricular ejection fraction, by estimation, is 60 to 65%. The  left ventricle has normal function. The left ventricle has no regional  wall motion abnormalities. There is moderate left ventricular hypertrophy.  Left  ventricular diastolic  parameters are indeterminate.  2. Right ventricular systolic function is normal. The right ventricular  size is normal. Tricuspid regurgitation signal is inadequate for assessing  PA pressure.  3. The mitral valve is grossly normal. No evidence of mitral valve  regurgitation.  4. The aortic valve was not well visualized. Aortic valve regurgitation  is not visualized. Mild to moderate aortic valve sclerosis/calcification  is present, without any evidence of aortic stenosis.  5. The inferior vena cava is normal in size with greater than 50%  respiratory variability, suggesting right atrial pressure of 3 mmHg.   Assessment & Plan:   Chronic obstructive pulmonary disease, unspecified COPD type (HCC) - Plan: Pulmonary Function Test  Tobacco use - Plan: Ambulatory Referral for Lung Cancer Scre, varenicline (CHANTIX PAK) 0.5 MG X 11 & 1 MG X 42 tablet  Snoring - Plan: Home sleep test  Discussion: Norma PandaSharon Demicco is a 70 year old woman, daily smoker with history of coronary artery disease, GERD, stroke and COPD who is referred to pulmonary clinic for evaluation of COPD.   Her COPD appears to be under good control at this time on trelegy ellipta daily and as needed albuterol. We will order her a nebulizer machine and albuterol solution to have on hand when needed for exacerbations.   We will schedule her for home sleep study to evaluate for concern of obstructive sleep apnea.   She is to start varenicline starter pack for smoking cessation and continue nicotine patches. She is to message or call us if she would like the varenicline continuation pack.  We will refer her to our lung cancer screening program.   She may benefit from the addition of montelukast in the future if her allergies seem to trigger COPD exacerbations.  Follow up in 3 months with pulmonary function tests.  Melody ComasJonathan Rayan Ines, MD [facility] Office:  [phone]   Current Outpatient Medications:    albuterol (PROVENTIL HFA) 108 (90 Base) MCG/ACT inhaler, Inhale 1 puff into the lungs every 6 (six) hours as needed for wheezing or shortness of breath., Disp: 1 Inhaler, Rfl: 0   alendronate (FOSAMAX) 70 MG tablet, Take 70 mg by mouth every Monday. , Disp: , Rfl:    Ascorbic Acid (VITAMIN C PO), Take 1 tablet by mouth daily at 12 noon., Disp: , Rfl:    aspirin EC 81 MG EC tablet, Take 1 tablet (81 mg total) by mouth daily. Swallow whole., Disp: 30 tablet, Rfl: 11   atorvastatin (LIPITOR) 40 MG tablet, Take 40 mg by mouth at bedtime. , Disp: , Rfl:    CALCIUM PO, Take 2 tablets by mouth daily at 12 noon., Disp: , Rfl:    Cholecalciferol (VITAMIN D3 PO), Take 1 tablet  by mouth daily at 12 noon., Disp: , Rfl:    clonazePAM (KLONOPIN) 0.5 MG tablet, Take 1 mg by mouth every evening. , Disp: , Rfl:    diphenhydrAMINE (BENADRYL) 25 MG tablet, Take 25 mg by mouth 2 (two) times daily as needed for itching or allergies., Disp: , Rfl:    FLUoxetine (PROZAC) 20 MG capsule, Take 60 mg by mouth daily. , Disp: , Rfl:    furosemide (LASIX) 20 MG tablet, Take 20 mg by mouth every other day. , Disp: , Rfl:    gabapentin (NEURONTIN) 300 MG capsule, Take 300-600 mg by mouth 3 (three) times daily. 300 mg twice daily. 600 mg at night, Disp: , Rfl:    lisinopril (ZESTRIL) 30 MG tablet, Take 30 mg by mouth daily. , Disp: , Rfl:    metoprolol tartrate (LOPRESSOR) 25 MG tablet, Take 25 mg by mouth 2 (two) times daily., Disp: , Rfl:    nicotine (NICODERM CQ - DOSED IN MG/24 HOURS) 21 mg/24hr patch, Place 1 patch (21 mg total) onto the skin daily., Disp: 28 patch, Rfl: 0   Olopatadine HCl (PATADAY OP), Place 2 drops into both eyes daily as needed (Dry/itchy eyes)., Disp: , Rfl:    omeprazole (PRILOSEC) 40 MG capsule, Take 40 mg by mouth daily. , Disp: , Rfl:    tiZANidine (ZANAFLEX) 4 MG tablet, Take 4 mg by mouth 3 (three) times daily as needed for  muscle spasms., Disp: , Rfl:    TRELEGY ELLIPTA 100-62.5-25 MCG/INH AEPB, Inhale 1 puff into the lungs daily., Disp: , Rfl:    varenicline (CHANTIX PAK) 0.5 MG X 11 & 1 MG X 42 tablet, Take one 0.5 mg tablet by mouth once daily for 3 days, then increase to one 0.5 mg tablet twice daily for 4 days, then increase to one 1 mg tablet twice daily., Disp: 53 tablet, Rfl: 0

## 2020-10-15 NOTE — Patient Instructions (Addendum)
Continue trelegy ellipta 1 puff daily  Continue as needed albuterol use 1-2 puffs as needed for chest tightness, shortness of breath, cough or wheezing.  We will order you a nebulizer machine and albuterol solution.    We will schedule you for a home sleep test.  We will refer you to our lung cancer screening program.

## 2020-10-17 ENCOUNTER — Other Ambulatory Visit: Payer: Self-pay

## 2020-10-17 DIAGNOSIS — K219 Gastro-esophageal reflux disease without esophagitis: Secondary | ICD-10-CM | POA: Diagnosis not present

## 2020-10-17 DIAGNOSIS — J449 Chronic obstructive pulmonary disease, unspecified: Secondary | ICD-10-CM | POA: Diagnosis not present

## 2020-10-17 DIAGNOSIS — M810 Age-related osteoporosis without current pathological fracture: Secondary | ICD-10-CM | POA: Diagnosis not present

## 2020-10-17 DIAGNOSIS — E7800 Pure hypercholesterolemia, unspecified: Secondary | ICD-10-CM | POA: Diagnosis not present

## 2020-10-17 DIAGNOSIS — I10 Essential (primary) hypertension: Secondary | ICD-10-CM | POA: Diagnosis not present

## 2020-10-17 DIAGNOSIS — M797 Fibromyalgia: Secondary | ICD-10-CM | POA: Diagnosis not present

## 2020-10-17 DIAGNOSIS — F411 Generalized anxiety disorder: Secondary | ICD-10-CM | POA: Diagnosis not present

## 2020-10-17 DIAGNOSIS — F17210 Nicotine dependence, cigarettes, uncomplicated: Secondary | ICD-10-CM | POA: Diagnosis not present

## 2020-10-17 MED ORDER — ALBUTEROL SULFATE (2.5 MG/3ML) 0.083% IN NEBU: 2.5000 mg | INHALATION_SOLUTION | Freq: Four times a day (QID) | RESPIRATORY_TRACT | 11 refills | Status: DC

## 2020-10-23 DIAGNOSIS — J449 Chronic obstructive pulmonary disease, unspecified: Secondary | ICD-10-CM | POA: Diagnosis not present

## 2020-10-24 DIAGNOSIS — J449 Chronic obstructive pulmonary disease, unspecified: Secondary | ICD-10-CM | POA: Diagnosis not present

## 2020-11-04 ENCOUNTER — Other Ambulatory Visit (HOSPITAL_COMMUNITY)
Admission: RE | Admit: 2020-11-04 | Discharge: 2020-11-04 | Disposition: A | Discharge status: Home / Self Care | Payer: Medicare HMO | Source: Ambulatory Visit | Attending: Pulmonary Disease | Admitting: Pulmonary Disease

## 2020-11-04 ENCOUNTER — Telehealth: Payer: Self-pay | Admitting: Acute Care

## 2020-11-04 ENCOUNTER — Ambulatory Visit (INDEPENDENT_AMBULATORY_CARE_PROVIDER_SITE_OTHER): Payer: Medicare HMO

## 2020-11-04 DIAGNOSIS — I6340 Cerebral infarction due to embolism of unspecified cerebral artery: Secondary | ICD-10-CM | POA: Diagnosis not present

## 2020-11-04 DIAGNOSIS — Z01812 Encounter for preprocedural laboratory examination: Secondary | ICD-10-CM | POA: Diagnosis not present

## 2020-11-04 DIAGNOSIS — Z87891 Personal history of nicotine dependence: Secondary | ICD-10-CM

## 2020-11-04 DIAGNOSIS — Z20822 Contact with and (suspected) exposure to covid-19: Secondary | ICD-10-CM | POA: Insufficient documentation

## 2020-11-04 DIAGNOSIS — F17210 Nicotine dependence, cigarettes, uncomplicated: Secondary | ICD-10-CM

## 2020-11-04 LAB — SARS CORONAVIRUS 2 (TAT 6-24 HRS): SARS Coronavirus 2: NEGATIVE

## 2020-11-04 NOTE — Telephone Encounter (Signed)
Spoke with pt and scheduled [facility] 12/04/20 11:00 CT ordered  Nothing further needed

## 2020-11-11 ENCOUNTER — Other Ambulatory Visit: Payer: Self-pay | Admitting: Pulmonary Disease

## 2020-11-11 DIAGNOSIS — Z720 Tobacco use: Secondary | ICD-10-CM

## 2020-11-11 LAB — CUP PACEART REMOTE DEVICE CHECK
Date Time Interrogation Session: 20220417000505
Implantable Pulse Generator Implant Date: 20210730

## 2020-11-14 ENCOUNTER — Other Ambulatory Visit: Payer: Self-pay

## 2020-11-14 ENCOUNTER — Ambulatory Visit: Payer: Medicare HMO

## 2020-11-14 DIAGNOSIS — R0683 Snoring: Secondary | ICD-10-CM

## 2020-11-14 DIAGNOSIS — G4733 Obstructive sleep apnea (adult) (pediatric): Secondary | ICD-10-CM | POA: Diagnosis not present

## 2020-11-19 ENCOUNTER — Other Ambulatory Visit: Payer: Self-pay | Admitting: Pulmonary Disease

## 2020-11-19 ENCOUNTER — Telehealth: Payer: Self-pay | Admitting: Pulmonary Disease

## 2020-11-19 DIAGNOSIS — G4733 Obstructive sleep apnea (adult) (pediatric): Secondary | ICD-10-CM

## 2020-11-19 NOTE — Telephone Encounter (Signed)
I called and spoke with patient regarding HST results and Dr. Agustina Caroli. She verbalized understanding and agreed to the in-lab sleep study. I put in order and told patient sleep lab will reach out to schedule appt. Patient verbalized understanding, nothing further needed.

## 2020-11-19 NOTE — Telephone Encounter (Signed)
Call patient  Sleep study result  Date of study: 11/14/2020  Impression: Severe obstructive sleep apnea Severe oxygen desaturations  Recommendation: Recommend CPAP therapy for severe obstructive sleep apnea Will recommend an in lab sleep study secondary to the severe obstructive sleep apnea and severe oxygen desaturations which may be related to the patient's known COPD  Close clinical follow-up for therapeutic deficiency  Encourage weight loss measures  Follow-up as previously scheduled  FYI: Dr. Francine Graven

## 2020-11-19 NOTE — Progress Notes (Signed)
Carelink Summary Report / Loop Recorder 

## 2020-11-23 DIAGNOSIS — J449 Chronic obstructive pulmonary disease, unspecified: Secondary | ICD-10-CM | POA: Diagnosis not present

## 2020-11-26 DIAGNOSIS — E7800 Pure hypercholesterolemia, unspecified: Secondary | ICD-10-CM | POA: Diagnosis not present

## 2020-11-26 DIAGNOSIS — R944 Abnormal results of kidney function studies: Secondary | ICD-10-CM | POA: Diagnosis not present

## 2020-11-26 DIAGNOSIS — I10 Essential (primary) hypertension: Secondary | ICD-10-CM | POA: Diagnosis not present

## 2020-11-26 DIAGNOSIS — K219 Gastro-esophageal reflux disease without esophagitis: Secondary | ICD-10-CM | POA: Diagnosis not present

## 2020-11-26 DIAGNOSIS — J449 Chronic obstructive pulmonary disease, unspecified: Secondary | ICD-10-CM | POA: Diagnosis not present

## 2020-11-26 DIAGNOSIS — M810 Age-related osteoporosis without current pathological fracture: Secondary | ICD-10-CM | POA: Diagnosis not present

## 2020-12-03 ENCOUNTER — Other Ambulatory Visit: Payer: Self-pay | Admitting: Pulmonary Disease

## 2020-12-03 DIAGNOSIS — Z720 Tobacco use: Secondary | ICD-10-CM

## 2020-12-04 ENCOUNTER — Other Ambulatory Visit: Payer: Self-pay

## 2020-12-04 ENCOUNTER — Encounter: Payer: Self-pay | Admitting: Acute Care

## 2020-12-04 ENCOUNTER — Ambulatory Visit (INDEPENDENT_AMBULATORY_CARE_PROVIDER_SITE_OTHER): Payer: Medicare HMO | Admitting: Acute Care

## 2020-12-04 ENCOUNTER — Ambulatory Visit
Admission: RE | Admit: 2020-12-04 | Discharge: 2020-12-04 | Disposition: A | Discharge status: Home / Self Care | Payer: Medicare HMO | Source: Ambulatory Visit | Attending: Acute Care | Admitting: Acute Care

## 2020-12-04 VITALS — BP 118/72 | HR 65 | Temp 97.3°F | Ht 59.0 in | Wt 194.4 lb

## 2020-12-04 DIAGNOSIS — Z87891 Personal history of nicotine dependence: Secondary | ICD-10-CM | POA: Diagnosis not present

## 2020-12-04 DIAGNOSIS — F17210 Nicotine dependence, cigarettes, uncomplicated: Secondary | ICD-10-CM | POA: Diagnosis not present

## 2020-12-04 DIAGNOSIS — J432 Centrilobular emphysema: Secondary | ICD-10-CM | POA: Diagnosis not present

## 2020-12-04 DIAGNOSIS — J984 Other disorders of lung: Secondary | ICD-10-CM | POA: Diagnosis not present

## 2020-12-04 DIAGNOSIS — I2510 Atherosclerotic heart disease of native coronary artery without angina pectoris: Secondary | ICD-10-CM | POA: Diagnosis not present

## 2020-12-04 NOTE — Patient Instructions (Signed)
Thank you for participating in the [facility]. It was our pleasure to meet you today. We will call you with the results of your scan within the next few days. Your scan will be assigned a Lung RADS category score by the physicians reading the scans.  This Lung RADS score determines follow up scanning.  See below for description of categories, and follow up screening recommendations. We will be in touch to schedule your follow up screening annually or based on recommendations of our providers. We will fax a copy of your scan results to your Primary Care Physician, or the physician who referred you to the program, to ensure they have the results. Please call the office if you have any questions or concerns regarding your scanning experience or results.  Our office number is [phone]. Please speak with Denise Phelps, RN. She is our Lung Cancer Screening RN. If she is unavailable when you call, please have the office staff send her a message. She will return your call at her earliest convenience. Remember, if your scan is normal, we will scan you annually as long as you continue to meet the criteria for the program. (Age 55-77, Current smoker or smoker who has quit within the last 15 years). If you are a smoker, remember, quitting is the single most powerful action that you can take to decrease your risk of lung cancer and other pulmonary, breathing related problems. We know quitting is hard, and we are here to help.  Please let us know if there is anything we can do to help you meet your goal of quitting. If you are a former smoker, congratulations. We are proud of you! Remain smoke free! Remember you can refer friends or family members through the number above.  We will screen them to make sure they meet criteria for the program. Thank you for helping us take better care of you by participating in Lung Screening.  Lung RADS Categories:  Lung RADS 1: no nodules  or definitely non-concerning nodules.  Recommendation is for a repeat annual scan in 12 months.  Lung RADS 2:  nodules that are non-concerning in appearance and behavior with a very low likelihood of becoming an active cancer. Recommendation is for a repeat annual scan in 12 months.  Lung RADS 3: nodules that are probably non-concerning , includes nodules with a low likelihood of becoming an active cancer.  Recommendation is for a 6-month repeat screening scan. Often noted after an upper respiratory illness. We will be in touch to make sure you have no questions, and to schedule your 6-month scan.  Lung RADS 4 A: nodules with concerning findings, recommendation is most often for a follow up scan in 3 months or additional testing based on our provider's assessment of the scan. We will be in touch to make sure you have no questions and to schedule the recommended 3 month follow up scan.  Lung RADS 4 B:  indicates findings that are concerning. We will be in touch with you to schedule additional diagnostic testing based on our provider's  assessment of the scan.   

## 2020-12-04 NOTE — Progress Notes (Signed)
Shared Decision Making Visit Lung Cancer Screening Program [phone])   Eligibility:  Age 69 y.o.  Pack Years Smoking History Calculation 70 pack year smoking history (# packs/per year x # years smoked)  Recent History of coughing up blood  no  Unexplained weight loss? no ( >Than 15 pounds within the last 6 months )  Prior History Lung / other cancer no (Diagnosis within the last 5 years already requiring surveillance chest CT Scans).  Smoking Status Current Smoker  Former Smokers: Years since quit: NA  Quit Date: NA  Visit Components:  Discussion included one or more decision making aids. yes  Discussion included risk/benefits of screening. yes  Discussion included potential follow up diagnostic testing for abnormal scans. yes  Discussion included meaning and risk of over diagnosis. yes  Discussion included meaning and risk of False Positives. yes  Discussion included meaning of total radiation exposure. yes  Counseling Included:  Importance of adherence to annual lung cancer LDCT screening. yes  Impact of comorbidities on ability to participate in the program. yes  Ability and willingness to under diagnostic treatment. yes  Smoking Cessation Counseling:  Current Smokers:   Discussed importance of smoking cessation. yes  Information about tobacco cessation classes and interventions provided to patient. yes  Patient provided with "ticket" for LDCT Scan. yes  Symptomatic Patient. no  Counseling NA  Diagnosis Code: Tobacco Use Z72.0  Asymptomatic Patient yes  Counseling (Intermediate counseling: > three minutes counseling) [id number]  Former Smokers:   Discussed the importance of maintaining cigarette abstinence. yes  Diagnosis Code: Personal History of Nicotine Dependence. D98.338  Information about tobacco cessation classes and interventions provided to patient. Yes  Patient provided with "ticket" for LDCT Scan. yes  Written Order for Lung Cancer  Screening with LDCT placed in Epic. Yes (CT Chest Lung Cancer Screening Low Dose W/O CM) [id number] Z12.2-Screening of respiratory organs Z87.891-Personal history of nicotine dependence  I have spent 25 minutes of face to face time with Norma Scott discussing the risks and benefits of lung cancer screening. We viewed a power point together that explained in detail the above noted topics. We paused at intervals to allow for questions to be asked and answered to ensure understanding.We discussed that the single most powerful action that she can take to decrease her risk of developing lung cancer is to quit smoking. We discussed whether or not she is ready to commit to setting a quit date. We discussed options for tools to aid in quitting smoking including nicotine replacement therapy, non-nicotine medications, support groups, Quit Smart classes, and behavior modification. We discussed that often times setting smaller, more achievable goals, such as eliminating 1 cigarette a day for a week and then 2 cigarettes a day for a week can be helpful in slowly decreasing the number of cigarettes smoked. This allows for a sense of accomplishment as well as providing a clinical benefit. I gave her the " Be Stronger Than Your Excuses" card with contact information for community resources, classes, free nicotine replacement therapy, and access to mobile apps, text messaging, and on-line smoking cessation help. I have also given her my card and contact information in the event she needs to contact me. We discussed the time and location of the scan, and that either Abigail Miyamoto RN or I will call with the results within 24-48 hours of receiving them. I have offered her  a copy of the power point we viewed  as a resource in the event they need reinforcement  of the concepts we discussed today in the office. The patient verbalized understanding of all of  the above and had no further questions upon leaving the office. They have my  contact information in the event they have any further questions.  I spent 3-4 minutes counseling on smoking cessation and the health risks of continued tobacco abuse.  I explained to the patient that there has been a high incidence of coronary artery disease noted on these exams. I explained that this is a non-gated exam therefore degree or severity cannot be determined. This patient is on statin therapy. I have asked the patient to follow-up with their PCP regarding any incidental finding of coronary artery disease and management with diet or medication as their PCP  feels is clinically indicated. The patient verbalized understanding of the above and had no further questions upon completion of the visit.      Norma Ngo, NP 12/04/2020

## 2020-12-05 ENCOUNTER — Other Ambulatory Visit: Payer: Self-pay

## 2020-12-05 DIAGNOSIS — I6523 Occlusion and stenosis of bilateral carotid arteries: Secondary | ICD-10-CM

## 2020-12-05 DIAGNOSIS — M5416 Radiculopathy, lumbar region: Secondary | ICD-10-CM | POA: Diagnosis not present

## 2020-12-05 DIAGNOSIS — I739 Peripheral vascular disease, unspecified: Secondary | ICD-10-CM

## 2020-12-05 DIAGNOSIS — R944 Abnormal results of kidney function studies: Secondary | ICD-10-CM | POA: Diagnosis not present

## 2020-12-09 ENCOUNTER — Ambulatory Visit (INDEPENDENT_AMBULATORY_CARE_PROVIDER_SITE_OTHER): Payer: Medicare HMO

## 2020-12-09 DIAGNOSIS — I6340 Cerebral infarction due to embolism of unspecified cerebral artery: Secondary | ICD-10-CM

## 2020-12-11 DIAGNOSIS — Z0100 Encounter for examination of eyes and vision without abnormal findings: Secondary | ICD-10-CM | POA: Diagnosis not present

## 2020-12-11 DIAGNOSIS — H5200 Hypermetropia, unspecified eye: Secondary | ICD-10-CM | POA: Diagnosis not present

## 2020-12-13 NOTE — Progress Notes (Signed)
I have attempted to call the patient with the results of her low dose CT. There was no answer. I have left a HIPPA compliant message asking the patient to call the office for results, with contact information. Norma Scott, please call the patient with her results, Lung  RADS 3, nodules that are probably benign findings, short term follow up suggested: includes nodules with a low likelihood of becoming a clinically active cancer. Radiology recommends a 6 month repeat LDCT follow up . Please order repeat 6 month follow up and fax results to PCP. Thanks so much

## 2020-12-17 LAB — CUP PACEART REMOTE DEVICE CHECK
Date Time Interrogation Session: 20220520000859
Implantable Pulse Generator Implant Date: 20210730

## 2020-12-24 ENCOUNTER — Other Ambulatory Visit: Payer: Self-pay | Admitting: *Deleted

## 2020-12-24 DIAGNOSIS — J449 Chronic obstructive pulmonary disease, unspecified: Secondary | ICD-10-CM | POA: Diagnosis not present

## 2020-12-24 DIAGNOSIS — Z87891 Personal history of nicotine dependence: Secondary | ICD-10-CM

## 2020-12-24 DIAGNOSIS — F17210 Nicotine dependence, cigarettes, uncomplicated: Secondary | ICD-10-CM

## 2020-12-30 ENCOUNTER — Other Ambulatory Visit: Payer: Self-pay | Admitting: Family Medicine

## 2020-12-30 DIAGNOSIS — Z1231 Encounter for screening mammogram for malignant neoplasm of breast: Secondary | ICD-10-CM

## 2020-12-31 ENCOUNTER — Telehealth: Payer: Self-pay | Admitting: Pulmonary Disease

## 2020-12-31 NOTE — Telephone Encounter (Signed)
[facility] [place], [place]

## 2021-01-01 NOTE — Progress Notes (Signed)
Carelink Summary Report / Loop Recorder 

## 2021-01-01 NOTE — Telephone Encounter (Signed)
[place] returned call. Per  Marita Kansas, order that was placed 4/26 was split night study. Marita Kansas saw the note from AO on 4/21 with his recommendations on pt and wanted to know if the correct order should have been placed as a cpap titration study, since pt has already had the HST which showed severe sleep apnea.  Dr. Val Eagle, please advise.

## 2021-01-02 ENCOUNTER — Other Ambulatory Visit: Payer: Self-pay

## 2021-01-02 ENCOUNTER — Ambulatory Visit (HOSPITAL_BASED_OUTPATIENT_CLINIC_OR_DEPARTMENT_OTHER): Payer: Medicare HMO | Attending: Pulmonary Disease | Admitting: Pulmonary Disease

## 2021-01-02 DIAGNOSIS — R0902 Hypoxemia: Secondary | ICD-10-CM | POA: Diagnosis not present

## 2021-01-02 DIAGNOSIS — G4733 Obstructive sleep apnea (adult) (pediatric): Secondary | ICD-10-CM | POA: Diagnosis not present

## 2021-01-03 ENCOUNTER — Encounter (HOSPITAL_COMMUNITY): Payer: Medicare HMO

## 2021-01-03 ENCOUNTER — Ambulatory Visit: Payer: Medicare HMO

## 2021-01-06 ENCOUNTER — Ambulatory Visit
Admission: RE | Admit: 2021-01-06 | Discharge: 2021-01-06 | Disposition: A | Discharge status: Home / Self Care | Payer: Medicare HMO | Source: Ambulatory Visit | Attending: Family Medicine | Admitting: Family Medicine

## 2021-01-06 ENCOUNTER — Other Ambulatory Visit: Payer: Self-pay

## 2021-01-06 DIAGNOSIS — Z1231 Encounter for screening mammogram for malignant neoplasm of breast: Secondary | ICD-10-CM

## 2021-01-06 NOTE — Telephone Encounter (Signed)
Called and spoke with Marita Kansas, he states that the sleep study was last week and he changed the order to a CPAP titration study based on the notes per Dr. Wynona Neat.  Nothing further needed.

## 2021-01-06 NOTE — Telephone Encounter (Signed)
Yes.  Patient needs a CPAP titration study

## 2021-01-08 ENCOUNTER — Other Ambulatory Visit: Payer: Self-pay

## 2021-01-08 ENCOUNTER — Ambulatory Visit: Payer: Medicare HMO | Admitting: Physician Assistant

## 2021-01-08 ENCOUNTER — Ambulatory Visit (INDEPENDENT_AMBULATORY_CARE_PROVIDER_SITE_OTHER)
Admission: RE | Admit: 2021-01-08 | Discharge: 2021-01-08 | Disposition: A | Discharge status: Home / Self Care | Payer: Medicare HMO | Source: Ambulatory Visit | Attending: Vascular Surgery | Admitting: Vascular Surgery

## 2021-01-08 ENCOUNTER — Ambulatory Visit (HOSPITAL_COMMUNITY)
Admission: RE | Admit: 2021-01-08 | Discharge: 2021-01-08 | Disposition: A | Discharge status: Home / Self Care | Payer: Medicare HMO | Source: Ambulatory Visit | Attending: Vascular Surgery | Admitting: Vascular Surgery

## 2021-01-08 VITALS — BP 148/78 | HR 71 | Temp 97.3°F | Resp 16 | Ht 59.0 in | Wt 191.7 lb

## 2021-01-08 DIAGNOSIS — I739 Peripheral vascular disease, unspecified: Secondary | ICD-10-CM | POA: Insufficient documentation

## 2021-01-08 DIAGNOSIS — I6523 Occlusion and stenosis of bilateral carotid arteries: Secondary | ICD-10-CM

## 2021-01-08 NOTE — Progress Notes (Signed)
Peripheral Arterial Disease Follow-Up   VASCULAR SURGERY ASSESSMENT & PLAN:   Norma Scott is a 70 y.o. female presents for routine follow-up of PAD and carotid artery stenosis. She is status post right carotid endarterectomy in July of 2010 by Dr. Myra Gianotti for asymptomatic stenosis. Negative history of CLI or tissue loss. She is not diabetic. 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 LLE arterial duplex in 2015 suggested left ilio-femoral occlusive disease.  Peripheral arterial disease.  Significant change in ABIs, right greater than left, as compared to 1 year ago.  I discussed her symptoms and significant drop in ABI on the right. I feel arteriogram is warranted and that she may have a component of neuropathy contributing to her right lower extremity pain. We discussed risk and rationale of aortogram and she agrees to proceed.  Bilateral carotid artery stenosis. Stable carotid duplex today as compared to 01/03/2020. No symptoms referable to carotid disease. We reviewed signs and symptoms of stroke/TIA and she is advise to call EMS should these occur.  She has an appointment tomorrow with neurologist and I encouraged her to undergo this evaluation as her symptoms may be attributable to spine disease in addition to suspected significant arterial disease. Continue optimal medical management of hypertension and follow-up with primary care physician. She is followed routinely by cardiology with implanted loop recorder. No current anticoagulation. Encouraged continuation of complete smoking cessation. Continue the following medications: aspirin, statin.   SUBJECTIVE:   She complains of right lower back, right buttock and right foot pain when walking approximately less than 1/2 block times months. She says this interferes with her daily activities. She can rest for a short period of time and resume. She complains of right foot  dysesthesia over the past several weeks. She also has pain at nighthtime; however she does not describe classic rest pain. No tissue loss.  She reports finally being able to stop smoking one month ago.  She also has a history of spondylolisthesis, lumbar stenosis and lumbar spondylosis.  She underwent posterior lumbar fusion 1 level on June 01, 2012 by Dr. Newell Coral.  She states she developed vision disturbance, left hand numbness and left-sided facial drooping in July 2021.  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 Symptoms resolved.  Loop recorder insertion on February 23, 2020.  CTA of head and neck performed.  Left ICA with 50% stenosis.  She states she has been told she has had several episodes of atrial fibrillation.  Hospital discharge summary indicates she was to continue DAPT for 3 weeks then continue aspirin. She is currently on no anticoagulation and states she was told to stop taking aspirin.  PHYSICAL EXAM:    General appearance: Well-developed, well-nourished in no apparent distress Neurologic: Alert and oriented x 4.  Face symmetric.  Speech fluent Cardiovascular: Heart rate and rhythm are regular.   Extremities: Skin intact.  Both feet are warm.   The dorsum of her right foot is tender to touch and she describes as a burning sensation.  Motor function intact Pulse exam: 1+ right femoral pulse.  Unable to palpate left femoral pulse. Monophasic right DP, peroneal and PT Doppler signals Monophasic left PT and peroneal Doppler signals  NON-INVASIVE VASCULAR STUDIES  01/08/2021 ABIs ABI/TBIToday's ABIToday's TBIPrevious ABIPrevious TBI  +-------+-----------+-----------+------------+------------+  Right  0.28       absent     0.92  0.62          +-------+-----------+-----------+------------+------------+  Left   0.49       0.35       0.66        0.47           +-------+-----------+-----------+------------+------------+ Right great toe pressure: absent. Monophasic waveforms Left great toe pressure: 49. Monophasic waveforms.  Carotid duplex: Right Carotid: Velocities in the right ICA are consistent with a 1-39% stenosis.   Left Carotid: Velocities in the left ICA are consistent with a 40-59% stenosis.   Vertebrals:  Bilateral vertebral arteries demonstrate antegrade flow.  Subclavians: Normal flow hemodynamics were seen in bilateral subclavian arteries.     PROBLEM LIST:    The patient's past medical history, past surgical history, family history, social history, allergy list and medication list are reviewed.   CURRENT MEDS:    Current Outpatient Medications:    albuterol (PROVENTIL HFA) 108 (90 Base) MCG/ACT inhaler, Inhale 1 puff into the lungs every 6 (six) hours as needed for wheezing or shortness of breath., Disp: 1 Inhaler, Rfl: 0   albuterol (PROVENTIL) (2.5 MG/3ML) 0.083% nebulizer solution, Take 3 mLs (2.5 mg total) by nebulization every 6 (six) hours. And PRN, Disp: 360 mL, Rfl: 11   alendronate (FOSAMAX) 70 MG tablet, Take 70 mg by mouth every Monday. , Disp: , Rfl:    Ascorbic Acid (VITAMIN C PO), Take 1 tablet by mouth daily at 12 noon., Disp: , Rfl:    aspirin EC 81 MG EC tablet, Take 1 tablet (81 mg total) by mouth daily. Swallow whole., Disp: 30 tablet, Rfl: 11   atorvastatin (LIPITOR) 40 MG tablet, Take 40 mg by mouth at bedtime. , Disp: , Rfl:    CALCIUM PO, Take 2 tablets by mouth daily at 12 noon., Disp: , Rfl:    Cholecalciferol (VITAMIN D3 PO), Take 1 tablet by mouth daily at 12 noon., Disp: , Rfl:    clonazePAM (KLONOPIN) 0.5 MG tablet, Take 1 mg by mouth every evening. , Disp: , Rfl:    diphenhydrAMINE (BENADRYL) 25 MG tablet, Take 25 mg by mouth 2 (two) times daily as needed for itching or allergies., Disp: , Rfl:    FLUoxetine (PROZAC) 20 MG capsule, Take 60 mg by mouth daily. , Disp: , Rfl:    furosemide (LASIX)  20 MG tablet, Take 20 mg by mouth every other day. , Disp: , Rfl:    gabapentin (NEURONTIN) 300 MG capsule, Take 300-600 mg by mouth 3 (three) times daily. 300 mg twice daily. 600 mg at night, Disp: , Rfl:    lisinopril (ZESTRIL) 30 MG tablet, Take 30 mg by mouth daily. , Disp: , Rfl:    metoprolol tartrate (LOPRESSOR) 25 MG tablet, Take 25 mg by mouth 2 (two) times daily., Disp: , Rfl:    nicotine (NICODERM CQ - DOSED IN MG/24 HOURS) 21 mg/24hr patch, Place 1 patch (21 mg total) onto the skin daily., Disp: 28 patch, Rfl: 0   Olopatadine HCl (PATADAY OP), Place 2 drops into both eyes daily as needed (Dry/itchy eyes)., Disp: , Rfl:    omeprazole (PRILOSEC) 40 MG capsule, Take 40 mg by mouth daily. , Disp: , Rfl:    tiZANidine (ZANAFLEX) 4 MG tablet, Take 4 mg by mouth 3 (three) times daily as needed for muscle spasms., Disp: , Rfl:    TRELEGY ELLIPTA 100-62.5-25 MCG/INH AEPB, Inhale 1 puff into the lungs daily., Disp: , Rfl:    varenicline (CHANTIX) 1 MG  tablet, Take 1 tablet by mouth twice daily, Disp: 42 tablet, Rfl: 2   REVIEW OF SYSTEMS:   [X]  denotes positive finding, [ ]  denotes negative finding Cardiac  Comments:  Chest pain or chest pressure:    Shortness of breath upon exertion:    Short of breath when lying flat:    Irregular heart rhythm:        Vascular    Pain in calf, thigh, or hip brought on by ambulation: x   Pain in feet at night that wakes you up from your sleep:  x   Blood clot in your veins:    Leg swelling:         Pulmonary    Oxygen at home:    Productive cough:     Wheezing:         Neurologic    Sudden weakness in arms or legs:     Sudden numbness in arms or legs:     Sudden onset of difficulty speaking or slurred speech:    Temporary loss of vision in one eye:     Problems with dizziness:         Gastrointestinal    Blood in stool:     Vomited blood:         Genitourinary    Burning when urinating:     Blood in urine:        Psychiatric    Major  depression:         Hematologic    Bleeding problems:    Problems with blood clotting too easily:        Skin    Rashes or ulcers:        Constitutional    Fever or chills:     , PA-C  Office: [phone] 01/08/2021  Clinic MD: Dr. [phone] on-call

## 2021-01-09 DIAGNOSIS — M5416 Radiculopathy, lumbar region: Secondary | ICD-10-CM | POA: Diagnosis not present

## 2021-01-09 DIAGNOSIS — Z6835 Body mass index (BMI) 35.0-35.9, adult: Secondary | ICD-10-CM | POA: Diagnosis not present

## 2021-01-10 ENCOUNTER — Telehealth: Payer: Self-pay | Admitting: Pulmonary Disease

## 2021-01-10 DIAGNOSIS — G4733 Obstructive sleep apnea (adult) (pediatric): Secondary | ICD-10-CM

## 2021-01-10 NOTE — Telephone Encounter (Signed)
Call patient  Sleep study result  Date of study: 01/02/2021  Impression: Severe obstructive sleep apnea Nocturnal desaturations  Recommendation: DME referral  Recommend CPAP therapy for moderate obstructive sleep apnea  Trial of CPAP therapy on 13 cm H2O with a Small size Fisher[employer]Paykel Full Face Mask Simplus mask and heated humidification. 2L of oxygen piped in to system.  Encourage weight loss measures  Follow-up in the office 4 to 6 weeks following initiation of treatment

## 2021-01-10 NOTE — Procedures (Signed)
POLYSOMNOGRAPHY  Last, First: Norma Scott, Norma Scott MRN: [id number] Gender: Female Age (years): 70 Weight (lbs): 192 DOB: 03-06-1951 BMI: 52 Primary Care: No PCP Epworth Score: 5 Referring: Tomma Lightning MD Technician: Cherylann Parr Interpreting: Tomma Lightning MD Study Type: CPAP Ordered Study Type: CPAP Study date: 01/02/2021 Location: [place] CLINICAL INFORMATION Norma Scott is a 70 year old Female and was referred to the sleep center for evaluation of N/A. Indications include Weight Gain, Witnessed Apneas, Witnesses Apnea / Gasping During Sleep.  MEDICATIONS Patient self administered medications include: N/A. Medications administered during study include No sleep medicine administered.  SLEEP STUDY TECHNIQUE The patient underwent an attended overnight polysomnography titration to assess the effects of CPAP therapy. The following variables were monitored: EEG(C4-A1, C3-A2, O1-A2, O2-A1), EOG, submental and leg EMG, ECG, oxyhemoglobin saturation by pulse oximetry, thoracic and abdominal respiratory effort belts, nasal/oral airflow by pressure sensor, body position sensor and snoring sensor. CPAP pressure was titrated to eliminate apneas, hypopneas and oxygen desaturation. Hypopneas were scored per AASM definition IB (4% desaturation)  TECHNICIAN COMMENTS Comments added by Technician: O2 initiated due to low sats. Comments added by Scorer: N/A SLEEP ARCHITECTURE The study was initiated at 10:16:07 PM and terminated at 5:18:51 AM. Total recorded time was 422.7 minutes. EEG confirmed total sleep time was 385.4 minutes yielding a sleep efficiency of 91.2%%. Sleep onset after lights out was 13.8 minutes with a REM latency of 157.0 minutes. The patient spent 7.3%% of the night in stage N1 sleep, 86.0%% in stage N2 sleep, 0.0%% in stage N3 and 6.8% in REM. The Arousal Index was 1.1/hour. RESPIRATORY PARAMETERS The overall AHI was 6.8 per hour, and the RDI was 7.0 events/hour  with a central apnea index of 0per hour. The most appropriate setting of CPAP was 13 cm H2O. At this setting, the sleep efficiency was 100% and the patient was supine for 100%. The AHI was 0 events per hour, and the RDI was 0 events/hour (with 0 central events) and the arousal index was 0 per hour.The oxygen nadir was 87.0% during sleep.    The cumulative time under 88% oxygen saturation was 5.5 minutes  LEG MOVEMENT DATA The total leg movements were 80 with a resulting leg movement index of 12.5/hr. Associated arousal with leg movement index was 0.0/hr. CARDIAC DATA The underlying cardiac rhythm was most consistent with sinus rhythm. Mean heart rate during sleep was 51.4 bpm. Additional rhythm abnormalities include None.  IMPRESSIONS - EKG showed no cardiac abnormalities. - No snoring was audible during this study. - Severe Oxygen Desaturation - Mild Obstructive Sleep apnea(OSA) Optimal pressure attained. - No significant periodic leg movements(PLMs) during sleep. However, no significant associated arousals. - Normal sleep efficiency, normal primary sleep latency, long REM sleep latency and no slow wave latency.  DIAGNOSIS - Obstructive Sleep Apnea (G47.33) - Nocturnal hypoxemia  RECOMMENDATIONS - Trial of CPAP therapy on 13 cm H2O with a Small size Fisher&Paykel Full Face Mask Simplus mask and heated humidification. - 2L of oxygen piped in to system. - Avoid alcohol, sedatives and other CNS depressants that may worsen sleep apnea and disrupt normal sleep architecture. - Sleep hygiene should be reviewed to assess factors that may improve sleep quality. - Weight management and regular exercise should be initiated or continued. - Return to Sleep Center for re-evaluation after 4 weeks of therapy  [Electronically signed] 01/10/2021 07:16 AM  Virl Diamond MD NPI: [id number]

## 2021-01-13 ENCOUNTER — Ambulatory Visit (INDEPENDENT_AMBULATORY_CARE_PROVIDER_SITE_OTHER): Payer: Medicare HMO

## 2021-01-13 DIAGNOSIS — I6340 Cerebral infarction due to embolism of unspecified cerebral artery: Secondary | ICD-10-CM

## 2021-01-15 ENCOUNTER — Encounter (HOSPITAL_COMMUNITY)
Admission: RE | Disposition: A | Discharge status: Home / Self Care | Payer: Self-pay | Source: Home / Self Care | Attending: Vascular Surgery

## 2021-01-15 ENCOUNTER — Ambulatory Visit (HOSPITAL_COMMUNITY)
Admission: RE | Admit: 2021-01-15 | Discharge: 2021-01-15 | Disposition: A | Discharge status: Home / Self Care | Payer: Medicare HMO | Attending: Vascular Surgery | Admitting: Vascular Surgery

## 2021-01-15 ENCOUNTER — Other Ambulatory Visit: Payer: Self-pay

## 2021-01-15 ENCOUNTER — Encounter (HOSPITAL_COMMUNITY): Payer: Self-pay | Admitting: Vascular Surgery

## 2021-01-15 DIAGNOSIS — I708 Atherosclerosis of other arteries: Secondary | ICD-10-CM | POA: Diagnosis not present

## 2021-01-15 DIAGNOSIS — I6522 Occlusion and stenosis of left carotid artery: Secondary | ICD-10-CM | POA: Insufficient documentation

## 2021-01-15 DIAGNOSIS — I739 Peripheral vascular disease, unspecified: Secondary | ICD-10-CM

## 2021-01-15 DIAGNOSIS — I70221 Atherosclerosis of native arteries of extremities with rest pain, right leg: Secondary | ICD-10-CM

## 2021-01-15 HISTORY — PX: ABDOMINAL AORTOGRAM W/LOWER EXTREMITY: CATH118223

## 2021-01-15 LAB — CUP PACEART REMOTE DEVICE CHECK
Date Time Interrogation Session: 20220622081328
Implantable Pulse Generator Implant Date: 20210730

## 2021-01-15 LAB — POCT I-STAT, CHEM 8
BUN: 16 mg/dL (ref 8–23)
Calcium, Ion: 1.26 mmol/L (ref 1.15–1.40)
Chloride: 99 mmol/L (ref 98–111)
Creatinine, Ser: 1.2 mg/dL — ABNORMAL HIGH (ref 0.44–1.00)
Glucose, Bld: 103 mg/dL — ABNORMAL HIGH (ref 70–99)
HCT: 38 % (ref 36.0–46.0)
Hemoglobin: 12.9 g/dL (ref 12.0–15.0)
Potassium: 4.2 mmol/L (ref 3.5–5.1)
Sodium: 138 mmol/L (ref 135–145)
TCO2: 27 mmol/L (ref 22–32)

## 2021-01-15 SURGERY — ABDOMINAL AORTOGRAM W/LOWER EXTREMITY
Anesthesia: LOCAL

## 2021-01-15 MED ORDER — SODIUM CHLORIDE 0.9% FLUSH: 3.0000 mL | Freq: Two times a day (BID) | INTRAVENOUS | Status: DC

## 2021-01-15 MED ORDER — LABETALOL HCL 5 MG/ML IV SOLN: 10.0000 mg | INTRAVENOUS | Status: DC | PRN

## 2021-01-15 MED ORDER — MIDAZOLAM HCL 2 MG/2ML IJ SOLN
INTRAMUSCULAR | Status: DC | PRN
  Administered 2021-01-15: 1 mg via INTRAVENOUS

## 2021-01-15 MED ORDER — HEPARIN (PORCINE) IN NACL 1000-0.9 UT/500ML-% IV SOLN
INTRAVENOUS | Status: AC
  Filled 2021-01-15: qty 1000

## 2021-01-15 MED ORDER — SODIUM CHLORIDE 0.9 % WEIGHT BASED INFUSION: 1.0000 mL/kg/h | INTRAVENOUS | Status: DC

## 2021-01-15 MED ORDER — SODIUM CHLORIDE 0.9 % IV SOLN: 250.0000 mL | INTRAVENOUS | Status: DC | PRN

## 2021-01-15 MED ORDER — LIDOCAINE HCL (PF) 1 % IJ SOLN
INTRAMUSCULAR | Status: AC
  Filled 2021-01-15: qty 30

## 2021-01-15 MED ORDER — ONDANSETRON HCL 4 MG/2ML IJ SOLN: 4.0000 mg | Freq: Four times a day (QID) | INTRAMUSCULAR | Status: DC | PRN

## 2021-01-15 MED ORDER — HYDRALAZINE HCL 20 MG/ML IJ SOLN: 5.0000 mg | INTRAMUSCULAR | Status: DC | PRN

## 2021-01-15 MED ORDER — MIDAZOLAM HCL 2 MG/2ML IJ SOLN
INTRAMUSCULAR | Status: AC
  Filled 2021-01-15: qty 2

## 2021-01-15 MED ORDER — LIDOCAINE HCL (PF) 1 % IJ SOLN
INTRAMUSCULAR | Status: DC | PRN
  Administered 2021-01-15: 2 mL via INTRADERMAL

## 2021-01-15 MED ORDER — SODIUM CHLORIDE 0.9 % IV SOLN: INTRAVENOUS | Status: DC

## 2021-01-15 MED ORDER — ACETAMINOPHEN 325 MG PO TABS: 650.0000 mg | ORAL_TABLET | ORAL | Status: DC | PRN

## 2021-01-15 MED ORDER — SODIUM CHLORIDE 0.9% FLUSH: 3.0000 mL | INTRAVENOUS | Status: DC | PRN

## 2021-01-15 MED ORDER — VERAPAMIL HCL 2.5 MG/ML IV SOLN
INTRAVENOUS | Status: AC
  Filled 2021-01-15: qty 2

## 2021-01-15 MED ORDER — VERAPAMIL HCL 2.5 MG/ML IV SOLN
INTRAVENOUS | Status: DC | PRN
  Administered 2021-01-15: 8 mL via INTRA_ARTERIAL

## 2021-01-15 MED ORDER — HEPARIN (PORCINE) IN NACL 1000-0.9 UT/500ML-% IV SOLN
INTRAVENOUS | Status: DC | PRN
  Administered 2021-01-15 (×2): 500 mL

## 2021-01-15 MED ORDER — IODIXANOL 320 MG/ML IV SOLN
INTRAVENOUS | Status: DC | PRN
  Administered 2021-01-15: 117 mL via INTRA_ARTERIAL

## 2021-01-15 MED ORDER — FENTANYL CITRATE (PF) 100 MCG/2ML IJ SOLN
INTRAMUSCULAR | Status: DC | PRN
  Administered 2021-01-15: 25 ug via INTRAVENOUS

## 2021-01-15 MED ORDER — HEPARIN SODIUM (PORCINE) 1000 UNIT/ML IJ SOLN
INTRAMUSCULAR | Status: DC | PRN
  Administered 2021-01-15: 5000 [IU] via INTRAVENOUS

## 2021-01-15 MED ORDER — FENTANYL CITRATE (PF) 100 MCG/2ML IJ SOLN
INTRAMUSCULAR | Status: AC
  Filled 2021-01-15: qty 2

## 2021-01-15 MED ORDER — ASPIRIN EC 81 MG PO TBEC
81.0000 mg | DELAYED_RELEASE_TABLET | Freq: Every day | ORAL | Status: DC
  Administered 2021-01-15: 81 mg via ORAL
  Filled 2021-01-15: qty 1

## 2021-01-15 SURGICAL SUPPLY — 13 items
CATH ANGIO 5F PIGTAIL 100CM (CATHETERS) ×1 IMPLANT
COVER DOME SNAP 22 D (MISCELLANEOUS) ×1 IMPLANT
GLIDESHEATH SLEND SS 6F .021 (SHEATH) ×2 IMPLANT
GLIDEWIRE ADV .035X260CM (WIRE) ×1 IMPLANT
KIT MICROPUNCTURE NIT STIFF (SHEATH) IMPLANT
KIT PV (KITS) ×2 IMPLANT
SHEATH PINNACLE 5F 10CM (SHEATH) IMPLANT
SHEATH PROBE COVER 6X72 (BAG) ×2 IMPLANT
STOPCOCK MORSE 400PSI 3WAY (MISCELLANEOUS) ×1 IMPLANT
SYR MEDRAD MARK V 150ML (SYRINGE) ×1 IMPLANT
TRANSDUCER W/STOPCOCK (MISCELLANEOUS) ×2 IMPLANT
TRAY PV CATH (CUSTOM PROCEDURE TRAY) ×2 IMPLANT
TUBING CONTRAST HIGH PRESS 48 (TUBING) ×2 IMPLANT

## 2021-01-15 NOTE — Discharge Instructions (Addendum)
Radial Site Care  This sheet gives you information about how to care for yourself after your procedure. Your health care provider may also give you more specific instructions. If you have problems or questions, contact your health care provider. What can I expect after the procedure? After the procedure, it is common to have: Bruising and tenderness at the catheter insertion area 1-2 weeks. Follow these instructions at home: Medicines Take over-the-counter and prescription medicines only as told by your health care provider. Insertion site care Follow instructions from your health care provider about how to take care of your insertion site. Make sure you: Wash your hands with soap and water before you remove your bandage (dressing). If soap and water are not available, use hand sanitizer. May remove dressing in 24 hours. Check your insertion site every day for signs of infection. Check for: Redness, swelling, or pain. Fluid or blood. Pus or a bad smell. Warmth. Do no take baths, swim, or use a hot tub for 7 days. You may shower 24-48 hours after the procedure. Remove the dressing and gently wash the site with plain soap and water. Pat the area dry with a clean towel. Do not rub the site. That could cause bleeding. Do not apply powder or lotion to the site. Activity  For 24 hours after the procedure, or as directed by your health care provider: Do not flex or bend the affected arm. Do not push or pull heavy objects with the affected arm. Do not drive yourself home from the hospital or clinic. You may drive 24 hours after the procedure. Do not operate machinery or power tools. KEEP ARM ELEVATED THE REMAINDER OF THE DAY. Do not push, pull or lift anything that is heavier than 10 lb for 7 days. Ask your health care provider when it is okay to: Return to work or school. Resume usual physical activities or sports. Resume sexual activity. General instructions If the catheter site  starts to bleed, raise your arm and put firm pressure on the site. If the bleeding does not stop, get help right away. This is a medical emergency. DRINK PLENTY OF FLUIDS FOR THE NEXT 2-3 DAYS. No alcohol consumption for 24 hours after receiving sedation. If you went home on the same day as your procedure, a responsible adult should be with you for the first 24 hours after you arrive home. Keep all follow-up visits as told by your health care provider. This is important. Contact a health care provider if: You have a fever. You have redness, swelling, or yellow drainage around your insertion site. Get help right away if: You have unusual pain at the radial site. The catheter insertion area swells very fast. The insertion area is bleeding, and the bleeding does not stop when you hold steady pressure on the area. Your arm or hand becomes pale, cool, tingly, or numb. These symptoms may represent a serious problem that is an emergency. Do not wait to see if the symptoms will go away. Get medical help right away. Call your local emergency services (911 in the [place] Do not drive yourself to the hospital. Summary After the procedure, it is common to have bruising and tenderness at the site. Follow instructions from your health care provider about how to take care of your radial site wound. Check the wound every day for signs of infection.  This information is not intended to replace advice given to you by your health care provider. Make sure you discuss any questions you  have with your health care provider. Document Revised: 08/18/2017 Document Reviewed: 08/18/2017 Elsevier Patient Education  2020 [employer].

## 2021-01-15 NOTE — Progress Notes (Signed)
Plan aortogram, bilateral lower extremity angiogram via left transradial access.  Rande Brunt. Lenell Antu, MD [facility] Phone Number: [phone] 01/15/2021 8:09 AM    Peripheral Arterial Disease Follow-Up   VASCULAR SURGERY ASSESSMENT & PLAN:   Norma Scott is a 70 y.o. female presents for routine follow-up of PAD and carotid artery stenosis. She is status post right carotid endarterectomy in July of 2010 by Dr. Myra Gianotti for asymptomatic stenosis. Negative history of CLI or tissue loss. She is not diabetic. 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 LLE arterial duplex in 2015 suggested left ilio-femoral occlusive disease.  Peripheral arterial disease.  Significant change in ABIs, right greater than left, as compared to 1 year ago.  I discussed her symptoms and significant drop in ABI on the right. I feel arteriogram is warranted and that she may have a component of neuropathy contributing to her right lower extremity pain. We discussed risk and rationale of aortogram and she agrees to proceed.   SUBJECTIVE:   She complains of right lower back, right buttock and right foot pain when walking approximately less than 1/2 block times months. She says this interferes with her daily activities. She can rest for a short period of time and resume. She complains of right foot dysesthesia over the past several weeks. She also has pain at nighthtime; however she does not describe classic rest pain. No tissue loss.  She reports finally being able to stop smoking one month ago.  She also has a history of spondylolisthesis, lumbar stenosis and lumbar spondylosis.  She underwent posterior lumbar fusion 1 level on June 01, 2012 by Dr. Newell Coral.  She states she developed vision disturbance, left hand numbness and left-sided facial drooping in July 2021.  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 Symptoms resolved.  Loop recorder insertion on February 23, 2020.  CTA of head and neck performed.  Left ICA with 50% stenosis.  She states she has been told she has had several episodes of atrial fibrillation.  Hospital discharge summary indicates she was to continue DAPT for 3 weeks then continue aspirin. She is currently on no anticoagulation and states she was told to stop taking aspirin.  PHYSICAL EXAM:    General appearance: Well-developed, well-nourished in no apparent distress Neurologic: Alert and oriented x 4.  Face symmetric.  Speech fluent Cardiovascular: Heart rate and rhythm are regular.   Extremities: Skin intact.  Both feet are warm.   The dorsum of her right foot is tender to touch and she describes as a burning sensation.  Motor function intact Pulse exam: 1+ right femoral pulse.  Unable to palpate left femoral pulse. Monophasic right DP, peroneal and PT Doppler signals Monophasic left PT and peroneal Doppler signals  NON-INVASIVE VASCULAR STUDIES  01/08/2021 ABIs ABI/TBIToday's ABIToday's TBIPrevious ABIPrevious TBI  +-------+-----------+-----------+------------+------------+  Right  0.28       absent     0.92        0.62          +-------+-----------+-----------+------------+------------+  Left   0.49       0.35       0.66        0.47          +-------+-----------+-----------+------------+------------+ Right great toe pressure: absent. Monophasic waveforms Left great toe pressure: 49. Monophasic waveforms.  Carotid duplex: Right Carotid: Velocities in the  right ICA are consistent with a 1-39% stenosis.   Left Carotid: Velocities in the left ICA are consistent with a 40-59% stenosis.   Vertebrals:  Bilateral vertebral arteries demonstrate antegrade flow.  Subclavians: Normal flow hemodynamics were seen in bilateral subclavian arteries.     PROBLEM LIST:    The patient's past medical  history, past surgical history, family history, social history, allergy list and medication list are reviewed.   CURRENT MEDS:    Current Facility-Administered Medications:    0.9 %  sodium chloride infusion, , Intravenous, Continuous, Leonie Douglas, MD, Last Rate: 100 mL/hr at 01/15/21 0726, New Bag at 01/15/21 0726   REVIEW OF SYSTEMS:   [X]  denotes positive finding, [ ]  denotes negative finding Cardiac  Comments:  Chest pain or chest pressure:    Shortness of breath upon exertion:    Short of breath when lying flat:    Irregular heart rhythm:        Vascular    Pain in calf, thigh, or hip brought on by ambulation: x   Pain in feet at night that wakes you up from your sleep:  x   Blood clot in your veins:    Leg swelling:         Pulmonary    Oxygen at home:    Productive cough:     Wheezing:         Neurologic    Sudden weakness in arms or legs:     Sudden numbness in arms or legs:     Sudden onset of difficulty speaking or slurred speech:    Temporary loss of vision in one eye:     Problems with dizziness:         Gastrointestinal    Blood in stool:     Vomited blood:         Genitourinary    Burning when urinating:     Blood in urine:        Psychiatric    Major depression:         Hematologic    Bleeding problems:    Problems with blood clotting too easily:        Skin    Rashes or ulcers:        Constitutional    Fever or chills:     , MD  Office: [phone] 01/15/2021

## 2021-01-15 NOTE — Op Note (Signed)
DATE OF SERVICE: 01/15/2021  PATIENT:  Norma Scott  70 y.o. 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  ANESTHESIA:   local and IV sedation  EBL: min  BLOOD ADMINISTERED:none  DRAINS: none   LOCAL MEDICATIONS USED:  LIDOCAINE   SPECIMEN:  none  COUNTS: confirmed correct.  TOURNIQUET:  none  PATIENT DISPOSITION:  PACU - hemodynamically stable.   Delay start of Pharmacological VTE agent (>24hrs) due to surgical blood loss or risk of bleeding: no  INDICATION FOR PROCEDURE: Norma Scott is a 70 y.o. female with ischemic rest pain of the right lower extremity. She has a known left iliac artery occlusion, but is asymptomatic on the left. After careful discussion of risks, benefits, and alternatives the patient was offered angiography via left radial access. We specifically discussed access site complications. The patient understood and wished to proceed.  OPERATIVE FINDINGS:  Aortogram unremarkable Occluded left common and internal iliac arteries with reconstitution of the external iliac artery Right common iliac stenosis (>80%) Right internal and external iliac arteries without significant stenosis Bilateral severe common femoral artery stenosis (>90%) Normal runoff from SFA --> tibial arteries bilaterally  DESCRIPTION OF PROCEDURE: After identification of the patient in the pre-operative holding area, the patient was transferred to the operating room. The patient was positioned supine on the operating room  table. Anesthesia was induced. The groins was prepped and draped in standard fashion. A surgical pause was performed confirming correct patient, procedure, and operative location.  The left wrist was anesthetized with subcutaneous injection of 1% lidocaine. Using ultrasound guidance, the left radial artery was accessed with micropuncture technique. The 14F sheath was upsized to 50F.   An 90 glidewire advantage was advanced into the descending thoracic aorta. Over the wire a pigtail catheter was advanced to the level of L2. Aortogram was performed - see above for details. Bilateral lower extremity angiography was then performed.  A TR band was used to close the arteriotomy. Hemostasis was excellent upon completion.  Conscious sedation was administered with the use of IV fentanyl and midazolam under continuous physician and nurse monitoring.  Heart rate, blood pressure, and oxygen saturation were continuously monitored.  Total sedation time was 25 minutes  Upon completion of the case instrument and sharps counts were confirmed correct. The patient was transferred to the PACU in good condition. I was present for all portions of the procedure.  PLAN: Needs hybrid vs open intervention. Bilateral common femoral endarterectomy with retrograde stenting may be best option. Could do this as a staged procedure. Aortobifemoral bypass an option, but BMI 38.5. Would like to avoid fem-fem bypass if possible. Check CT A/P without contrast to evaluate plaque burden in infrarenal aorta. Will ask cardiology for risk stratification. Follow up with me after the above.   Rande Brunt. Lenell Antu, MD [facility] Phone Number: [phone] 01/15/2021 9:37 AM

## 2021-01-16 ENCOUNTER — Ambulatory Visit: Payer: Medicare HMO | Admitting: Cardiology

## 2021-01-16 DIAGNOSIS — M545 Low back pain, unspecified: Secondary | ICD-10-CM | POA: Diagnosis not present

## 2021-01-16 DIAGNOSIS — M5416 Radiculopathy, lumbar region: Secondary | ICD-10-CM | POA: Diagnosis not present

## 2021-01-20 ENCOUNTER — Other Ambulatory Visit: Payer: Self-pay

## 2021-01-20 ENCOUNTER — Telehealth: Payer: Self-pay | Admitting: Cardiology

## 2021-01-20 ENCOUNTER — Ambulatory Visit: Payer: Medicare HMO | Admitting: Pulmonary Disease

## 2021-01-20 VITALS — BP 112/64 | HR 69 | Temp 97.8°F | Ht 59.0 in | Wt 190.6 lb

## 2021-01-20 DIAGNOSIS — R0982 Postnasal drip: Secondary | ICD-10-CM | POA: Diagnosis not present

## 2021-01-20 DIAGNOSIS — J449 Chronic obstructive pulmonary disease, unspecified: Secondary | ICD-10-CM | POA: Diagnosis not present

## 2021-01-20 DIAGNOSIS — G4733 Obstructive sleep apnea (adult) (pediatric): Secondary | ICD-10-CM

## 2021-01-20 MED ORDER — IPRATROPIUM BROMIDE 0.03 % NA SOLN: 2.0000 | Freq: Two times a day (BID) | NASAL | 12 refills | Status: DC

## 2021-01-20 MED ORDER — FLUTICASONE PROPIONATE 50 MCG/ACT NA SUSP: 2.0000 | Freq: Every day | NASAL | 6 refills | Status: DC

## 2021-01-20 NOTE — Progress Notes (Signed)
Synopsis: Referred in March 2022 by Dr. Merri Brunette for COPD  Subjective:   PATIENT ID: Norma Scott GENDER: female DOB: Sep 02, 1950, MRN: [id number]  HPI  Chief Complaint  Patient presents with   Follow-up    COPD   Norma Scott is a 70 year old woman, daily smoker with history of coronary artery disease, GERD, stroke and COPD who returns to pulmonary clinic for COPD follow up.   She reports she quit smoking on April 25 using nicotine patches and generic Chantix.  She had a sleep study done which showed severe obstructive sleep apnea and severe oxygen desaturations.  She was then set up for CPAP titration study and has been ordered a CPAP machine and supplies.  She has enrolled in our CT lung cancer screening program and has a repeat CT scan in 6 months.  OV 10/15/20 She is currently using trelegy ellipta 100 and has been on this medication for a couple years. She has noted benefit from its use. She is using albuterol as needed, mainly when she has exacerbations.   She reports having seasonal allergies that can be worse in the spring time. She does have sinus congestion and drainage.   She does have GERD but this is fairly well controlled on omeprazole.   She does have daily cough with whitish sputum production.   She continues to smoke 7 cigarettes daily. She is wearing a  nicotine patch which has helped her cut back. She previously liked chantix as this helped her cut back to 3 cigarettes daily. She has been smoking since age 11 and has smoked up to 2 packs per day. She has over a 30 pack year smoking history.   She has had swelling of her lower extremities, right greater than left in which she had lower extremity US 01/2020 which was negative for DVT.   She has trouble being physically active due to her back pain.   She reports occasionally waking up short of breath. She has history of snoring.   Past Medical History:  Diagnosis Date   Allergy     allergic rhinitis   Arteriosclerotic coronary artery disease 05/21/2015   Asthma    CAD (coronary artery disease)    patient says no problems   dr Severiano Gilbert pcp   Carotid artery occlusion    Cervical disc displacement    x2   COPD (chronic obstructive pulmonary disease) (HCC)    Esophageal reflux    Essential hypertension 05/21/2015   Family history of adverse reaction to anesthesia    mother hard to wake and affected b/p   Fibromyalgia    Gall stones    Generalized anxiety disorder    Hyperlipidemia    Lumbar disc herniation    L5-S1   Mini stroke (HCC)    TIA (transient ischemic attack) 05/21/2015     Family History  Problem Relation Age of Onset   Stroke Mother    Hypertension Mother    Hyperlipidemia Mother    Seizures Mother        Grand mal seizures   Emphysema Mother    Diabetes Father    Heart disease Father        Heart Disease before age 46   Breast cancer Maternal Aunt    Heart disease Paternal Uncle    Heart attack Paternal Uncle    Stroke Maternal Grandmother    Heart Problems Maternal Grandmother    Cancer Maternal Grandfather  stomach cancer   Heart attack Maternal Grandfather    Cancer Paternal Grandmother 10   Heart disease Paternal Grandfather    Heart attack Paternal Grandfather    Deep vein thrombosis Brother    Asthma Brother      Social History   Socioeconomic History   Marital status: Divorced    Spouse name: Not on file   Number of children: Not on file   Years of education: Not on file   Highest education level: Not on file  Occupational History   Not on file  Tobacco Use   Smoking status: Former    Packs/day: 0.50    Years: 45.00    Pack years: 22.50    Types: Cigarettes    Quit date: 11/18/2020    Years since quitting: 0.1   Smokeless tobacco: Never  Substance and Sexual Activity   Alcohol use: Yes    Alcohol/week: 0.0 standard drinks    Comment: 2 times a year   Drug use: No   Sexual activity: Not on file   Other Topics Concern   Not on file  Social History Narrative   Epworth Sleepiness Scale = 10 (as of 05/21/2015)   Social Determinants of Health   Financial Resource Strain: Not on file  Food Insecurity: Not on file  Transportation Needs: Not on file  Physical Activity: Not on file  Stress: Not on file  Social Connections: Not on file  Intimate Partner Violence: Not on file     Allergies  Allergen Reactions   Bupropion Other (See Comments)    Hallucinations    Septra [Sulfamethoxazole-Trimethoprim] Nausea And Vomiting   Sulfa Antibiotics Nausea And Vomiting   Zithromax [Azithromycin] Hives   Cymbalta [Duloxetine Hcl] Other (See Comments)    fatigue     Outpatient Medications Prior to Visit  Medication Sig Dispense Refill   acetaminophen (TYLENOL) 325 MG tablet Take 650 mg by mouth every 6 (six) hours as needed for moderate pain or headache.     albuterol (PROVENTIL HFA) 108 (90 Base) MCG/ACT inhaler Inhale 1 puff into the lungs every 6 (six) hours as needed for wheezing or shortness of breath. 1 Inhaler 0   albuterol (PROVENTIL) (2.5 MG/3ML) 0.083% nebulizer solution Take 3 mLs (2.5 mg total) by nebulization every 6 (six) hours. And PRN 360 mL 11   alendronate (FOSAMAX) 70 MG tablet Take 70 mg by mouth every Saturday.     amLODipine (NORVASC) 10 MG tablet Take 10 mg by mouth daily.     Ascorbic Acid (VITAMIN C PO) Take 1 tablet by mouth daily.     Ascorbic Acid (VITAMIN C) 100 MG tablet Take 100 mg by mouth daily.     aspirin EC 81 MG EC tablet Take 1 tablet (81 mg total) by mouth daily. Swallow whole. 30 tablet 11   atorvastatin (LIPITOR) 40 MG tablet Take 40 mg by mouth at bedtime.      Calcium Carb-Cholecalciferol (CALCIUM 600 + D PO) Take 1 tablet by mouth daily.     cetirizine (ZYRTEC) 10 MG tablet Take 10 mg by mouth daily.     clonazePAM (KLONOPIN) 0.5 MG tablet Take 0.5 mg by mouth See admin instructions. Take 0.5 mg by mouth twice daily, may take a third 0.5 mg dose  as needed for anxiety     Coenzyme Q10 (COQ-10) 100 MG CAPS Take 100 mg by mouth daily.     diphenhydrAMINE HCl (ZZZQUIL) 50 MG/30ML LIQD Take 50 mg by mouth at  bedtime.     FLUoxetine (PROZAC) 20 MG capsule Take 60 mg by mouth daily.      furosemide (LASIX) 20 MG tablet Take 20 mg by mouth daily.     gabapentin (NEURONTIN) 300 MG capsule Take 300-600 mg by mouth See admin instructions. Take 300 mg in the morning, 300 mg at noon, and 600 mg at bedtime     hydrocortisone cream 1 % Apply 1 application topically daily.     lisinopril (ZESTRIL) 30 MG tablet Take 30 mg by mouth daily.      metoprolol tartrate (LOPRESSOR) 25 MG tablet Take 25 mg by mouth 2 (two) times daily.     Multiple Vitamin (MULTIVITAMIN WITH MINERALS) TABS tablet Take 1 tablet by mouth daily.     olopatadine (PATANOL) 0.1 % ophthalmic solution Place 1 drop into both eyes daily as needed (dry/itchy eyes).     omeprazole (PRILOSEC) 40 MG capsule Take 40 mg by mouth daily.      potassium gluconate 595 (99 K) MG TABS tablet Take 595 mg by mouth daily.     tiZANidine (ZANAFLEX) 4 MG tablet Take 4 mg by mouth every 8 (eight) hours as needed for muscle spasms.     TRELEGY ELLIPTA 100-62.5-25 MCG/INH AEPB Inhale 1 puff into the lungs daily.     varenicline (CHANTIX) 1 MG tablet Take 1 tablet by mouth twice daily (Patient taking differently: Take 1 mg by mouth daily.) 42 tablet 2   fluticasone (FLONASE) 50 MCG/ACT nasal spray Place 2 sprays into both nostrils daily.     No facility-administered medications prior to visit.    Review of Systems  Constitutional:  Negative for chills, fever, malaise/fatigue and weight loss.  HENT:  Negative for congestion, sinus pain and sore throat.   Eyes: Negative.   Respiratory:  Positive for shortness of breath. Negative for hemoptysis, sputum production and wheezing.   Cardiovascular:  Negative for chest pain, palpitations, orthopnea, claudication and leg swelling.  Gastrointestinal:  Negative  for abdominal pain, heartburn, nausea and vomiting.  Genitourinary: Negative.   Musculoskeletal:  Negative for joint pain and myalgias.  Skin:  Negative for rash.  Neurological:  Negative for weakness.  Endo/Heme/Allergies: Negative.   Psychiatric/Behavioral: Negative.       Objective:   Vitals:   01/20/21 0918  BP: 112/64  Pulse: 69  Temp: 97.8 F (36.6 C)  TempSrc: Oral  SpO2: 95%  Weight: 190 lb 9.6 oz (86.5 kg)  Height: 4\' 11"  (1.499 m)     Physical Exam Constitutional:      General: She is not in acute distress.    Appearance: She is not ill-appearing.  HENT:     Head: Normocephalic and atraumatic.     Nose: Nose normal.     Mouth/Throat:     Mouth: Mucous membranes are moist.     Pharynx: Oropharynx is clear.  Eyes:     General: No scleral icterus.    Conjunctiva/sclera: Conjunctivae normal.     Pupils: Pupils are equal, round, and reactive to light.  Cardiovascular:     Rate and Rhythm: Normal rate and regular rhythm.     Pulses: Normal pulses.     Heart sounds: Normal heart sounds. No murmur heard. Pulmonary:     Effort: Pulmonary effort is normal.     Breath sounds: No wheezing, rhonchi or rales.  Abdominal:     General: Bowel sounds are normal.     Palpations: Abdomen is soft.  Musculoskeletal:  Right lower leg: No edema.     Left lower leg: No edema.  Lymphadenopathy:     Cervical: No cervical adenopathy.  Skin:    General: Skin is warm and dry.  Neurological:     General: No focal deficit present.     Mental Status: She is alert.  Psychiatric:        Mood and Affect: Mood normal.        Behavior: Behavior normal.        Thought Content: Thought content normal.        Judgment: Judgment normal.   CBC    Component Value Date/Time   WBC 8.4 02/23/2020 0754   RBC 4.81 02/23/2020 0754   HGB 12.9 01/15/2021 0731   HCT 38.0 01/15/2021 0731   PLT 211 02/23/2020 0754   MCV 91.3 02/23/2020 0754   MCH 28.1 02/23/2020 0754   MCHC 30.8  02/23/2020 0754   RDW 12.7 02/23/2020 0754   LYMPHSABS 3.0 02/21/2020 2018   MONOABS 0.7 02/21/2020 2018   EOSABS 0.2 02/21/2020 2018   BASOSABS 0.1 02/21/2020 2018   BMP Latest Ref Rng & Units 01/15/2021 02/23/2020 02/22/2020  Glucose 70 - 99 mg/dL 161(W103(H) 960(A123(H) 540(J110(H)  BUN 8 - 23 mg/dL 16 6(L) 7(L)  Creatinine 0.44 - 1.00 mg/dL 8.11(B1.20(H) 1.470.75 8.290.71  Sodium 135 - 145 mmol/L 138 141 138  Potassium 3.5 - 5.1 mmol/L 4.2 4.1 3.3(L)  Chloride 98 - 111 mmol/L 99 105 101  CO2 22 - 32 mmol/L - 29 26  Calcium 8.9 - 10.3 mg/dL - 8.8(L) 8.8(L)   Chest imaging: CT Chest Lung Cancer Screening 12/04/20 Mediastinum/Nodes: No mediastinal lymphadenopathy.   Lungs/Pleura: Centrilobular and paraseptal emphysema. Biapical pleuroparenchymal scarring noted, right greater than left. Bilateral pulmonary nodules identified measuring up to 6.3 mm in the paraspinal left lower lobe (image 115). No overtly suspicious nodule or mass. No focal airspace consolidation. No pleural effusion.  CXR 02/21/20 Borderline cardiomegaly. No consolidation or effusion. Aortic atherosclerosis. No pneumothorax.  PFT: No flowsheet data found.  Labs:  Path:  Echo 02/22/20:  1. Left ventricular ejection fraction, by estimation, is 60 to 65%. The  left ventricle has normal function. The left ventricle has no regional  wall motion abnormalities. There is moderate left ventricular hypertrophy.  Left ventricular diastolic  parameters are indeterminate.   2. Right ventricular systolic function is normal. The right ventricular  size is normal. Tricuspid regurgitation signal is inadequate for assessing  PA pressure.   3. The mitral valve is grossly normal. No evidence of mitral valve  regurgitation.   4. The aortic valve was not well visualized. Aortic valve regurgitation  is not visualized. Mild to moderate aortic valve sclerosis/calcification  is present, without any evidence of aortic stenosis.   5. The inferior vena cava is  normal in size with greater than 50%  respiratory variability, suggesting right atrial pressure of 3 mmHg.   Assessment & Plan:   Chronic obstructive pulmonary disease, unspecified COPD type (HCC) - Plan: AMB referral to pulmonary rehabilitation  OSA (obstructive sleep apnea)  Post-nasal drainage - Plan: fluticasone (FLONASE) 50 MCG/ACT nasal spray, ipratropium (ATROVENT) 0.03 % nasal spray  Discussion: Laurie PandaSharon Mcquinn is a 70 year old woman, daily smoker with history of coronary artery disease, GERD, stroke and COPD who returns to pulmonary clinic for COPD follow up.   Her COPD appears to be under good control at this time on trelegy ellipta daily and as needed albuterol.  She continues to  have pretty significant exertional dyspnea and she is amenable to try pulmonary rehab which we have placed a referral today.  We have congratulated her on quitting smoking back in April.  She has recently been diagnosed with severe obstructive sleep apnea and has been ordered a CPAP machine.  We will also schedule her for pulmonary function testing.  Follow up in 6 in months.  Melody Comas, MD [facility] Office: [phone]   Current Outpatient Medications:    acetaminophen (TYLENOL) 325 MG tablet, Take 650 mg by mouth every 6 (six) hours as needed for moderate pain or headache., Disp: , Rfl:    albuterol (PROVENTIL HFA) 108 (90 Base) MCG/ACT inhaler, Inhale 1 puff into the lungs every 6 (six) hours as needed for wheezing or shortness of breath., Disp: 1 Inhaler, Rfl: 0   albuterol (PROVENTIL) (2.5 MG/3ML) 0.083% nebulizer solution, Take 3 mLs (2.5 mg total) by nebulization every 6 (six) hours. And PRN, Disp: 360 mL, Rfl: 11   alendronate (FOSAMAX) 70 MG tablet, Take 70 mg by mouth every Saturday., Disp: , Rfl:    amLODipine (NORVASC) 10 MG tablet, Take 10 mg by mouth daily., Disp: , Rfl:    Ascorbic Acid (VITAMIN C PO), Take 1 tablet by mouth daily., Disp: , Rfl:     Ascorbic Acid (VITAMIN C) 100 MG tablet, Take 100 mg by mouth daily., Disp: , Rfl:    aspirin EC 81 MG EC tablet, Take 1 tablet (81 mg total) by mouth daily. Swallow whole., Disp: 30 tablet, Rfl: 11   atorvastatin (LIPITOR) 40 MG tablet, Take 40 mg by mouth at bedtime. , Disp: , Rfl:    Calcium Carb-Cholecalciferol (CALCIUM 600 + D PO), Take 1 tablet by mouth daily., Disp: , Rfl:    cetirizine (ZYRTEC) 10 MG tablet, Take 10 mg by mouth daily., Disp: , Rfl:    clonazePAM (KLONOPIN) 0.5 MG tablet, Take 0.5 mg by mouth See admin instructions. Take 0.5 mg by mouth twice daily, may take a third 0.5 mg dose as needed for anxiety, Disp: , Rfl:    Coenzyme Q10 (COQ-10) 100 MG CAPS, Take 100 mg by mouth daily., Disp: , Rfl:    diphenhydrAMINE HCl (ZZZQUIL) 50 MG/30ML LIQD, Take 50 mg by mouth at bedtime., Disp: , Rfl:    FLUoxetine (PROZAC) 20 MG capsule, Take 60 mg by mouth daily. , Disp: , Rfl:    furosemide (LASIX) 20 MG tablet, Take 20 mg by mouth daily., Disp: , Rfl:    gabapentin (NEURONTIN) 300 MG capsule, Take 300-600 mg by mouth See admin instructions. Take 300 mg in the morning, 300 mg at noon, and 600 mg at bedtime, Disp: , Rfl:    hydrocortisone cream 1 %, Apply 1 application topically daily., Disp: , Rfl:    ipratropium (ATROVENT) 0.03 % nasal spray, Place 2 sprays into both nostrils every 12 (twelve) hours., Disp: 30 mL, Rfl: 12   lisinopril (ZESTRIL) 30 MG tablet, Take 30 mg by mouth daily. , Disp: , Rfl:    metoprolol tartrate (LOPRESSOR) 25 MG tablet, Take 25 mg by mouth 2 (two) times daily., Disp: , Rfl:    Multiple Vitamin (MULTIVITAMIN WITH MINERALS) TABS tablet, Take 1 tablet by mouth daily., Disp: , Rfl:    olopatadine (PATANOL) 0.1 % ophthalmic solution, Place 1 drop into both eyes daily as needed (dry/itchy eyes)., Disp: , Rfl:    omeprazole (PRILOSEC) 40 MG capsule, Take 40 mg by mouth daily. , Disp: ,  Rfl:    potassium gluconate 595 (99 K) MG TABS tablet, Take 595 mg by mouth  daily., Disp: , Rfl:    tiZANidine (ZANAFLEX) 4 MG tablet, Take 4 mg by mouth every 8 (eight) hours as needed for muscle spasms., Disp: , Rfl:    TRELEGY ELLIPTA 100-62.5-25 MCG/INH AEPB, Inhale 1 puff into the lungs daily., Disp: , Rfl:    varenicline (CHANTIX) 1 MG tablet, Take 1 tablet by mouth twice daily (Patient taking differently: Take 1 mg by mouth daily.), Disp: 42 tablet, Rfl: 2   fluticasone (FLONASE) 50 MCG/ACT nasal spray, Place 2 sprays into both nostrils daily., Disp: 15.8 mL, Rfl: 6

## 2021-01-20 NOTE — Telephone Encounter (Signed)
New Message:      Pt says she needs the card that shows that she have a Loop Recorder please.

## 2021-01-20 NOTE — Telephone Encounter (Signed)
I called and spoke with patient regarding HST and Dr. Agustina Caroli. Patient verbalized understanding and I sent in order for CPAP and O2 to be bled into CPAP. I informed patient DME company will be calling next to start process. Patient verbalized understanding , nothing further needed.

## 2021-01-20 NOTE — Telephone Encounter (Signed)
I called the patient and gave her the number to [facility] tech support. I let her know we do not keep the ID cards at the office.

## 2021-01-20 NOTE — Patient Instructions (Addendum)
Continue fluticasone nasal spray, 2 sprays per nostril daily  Start ipratropium nasal spray, 2 sprays per nostril twice daily as needed or nasal drainage  Continue trelegy ellipta daily and as needed albuterol   We will schedule you for pulmonary function tests and send a referral to pulmonary rehab.

## 2021-01-21 NOTE — H&P (Signed)
Plan aortogram, bilateral lower extremity angiogram via left transradial access.  Eliott Amparan N. Rillie Riffel, MD [facility] Office Phone Number: [phone] 01/15/2021 8:09 AM    Peripheral Arterial Disease Follow-Up   VASCULAR SURGERY ASSESSMENT & PLAN:   Norma Scott is a 70 y.o. female presents for routine follow-up of PAD and carotid artery stenosis. She is status post right carotid endarterectomy in July of 2010 by Dr. Brabham for asymptomatic stenosis. Negative history of CLI or tissue loss. She is not diabetic. 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 LLE arterial duplex in 2015 suggested left ilio-femoral occlusive disease.  Peripheral arterial disease.  Significant change in ABIs, right greater than left, as compared to 1 year ago.  I discussed her symptoms and significant drop in ABI on the right. I feel arteriogram is warranted and that she may have a component of neuropathy contributing to her right lower extremity pain. We discussed risk and rationale of aortogram and she agrees to proceed.   SUBJECTIVE:   She complains of right lower back, right buttock and right foot pain when walking approximately less than 1/2 block times months. She says this interferes with her daily activities. She can rest for a short period of time and resume. She complains of right foot dysesthesia over the past several weeks. She also has pain at nighthtime; however she does not describe classic rest pain. No tissue loss.  She reports finally being able to stop smoking one month ago.  She also has a history of spondylolisthesis, lumbar stenosis and lumbar spondylosis.  She underwent posterior lumbar fusion 1 level on June 01, 2012 by Dr. Nudelman.  She states she developed vision disturbance, left hand numbness and left-sided facial drooping in July 2021.  She was evaluated at [facility] and  diagnosed with stroke, multifocal likely embolic, cryptogenic. Symptoms resolved.  Loop recorder insertion on February 23, 2020.  CTA of head and neck performed.  Left ICA with 50% stenosis.  She states she has been told she has had several episodes of atrial fibrillation.  Hospital discharge summary indicates she was to continue DAPT for 3 weeks then continue aspirin. She is currently on no anticoagulation and states she was told to stop taking aspirin.  PHYSICAL EXAM:    General appearance: Well-developed, well-nourished in no apparent distress Neurologic: Alert and oriented x 4.  Face symmetric.  Speech fluent Cardiovascular: Heart rate and rhythm are regular.   Extremities: Skin intact.  Both feet are warm.   The dorsum of her right foot is tender to touch and she describes as a burning sensation.  Motor function intact Pulse exam: 1+ right femoral pulse.  Unable to palpate left femoral pulse. Monophasic right DP, peroneal and PT Doppler signals Monophasic left PT and peroneal Doppler signals  NON-INVASIVE VASCULAR STUDIES  01/08/2021 ABIs ABI/TBIToday's ABIToday's TBIPrevious ABIPrevious TBI  +-------+-----------+-----------+------------+------------+  Right  0.28       absent     0.92        0.62          +-------+-----------+-----------+------------+------------+  Left   0.49       0.35       0.66        0.47          +-------+-----------+-----------+------------+------------+ Right great toe pressure: absent. Monophasic waveforms Left great toe pressure: 49. Monophasic waveforms.  Carotid duplex: Right Carotid: Velocities in the   right ICA are consistent with a 1-39% stenosis.   Left Carotid: Velocities in the left ICA are consistent with a 40-59% stenosis.   Vertebrals:  Bilateral vertebral arteries demonstrate antegrade flow.  Subclavians: Normal flow hemodynamics were seen in bilateral subclavian arteries.     PROBLEM LIST:    The patient's past medical  history, past surgical history, family history, social history, allergy list and medication list are reviewed.   CURRENT MEDS:    Current Facility-Administered Medications:    0.9 %  sodium chloride infusion, , Intravenous, Continuous, Belford Pascucci N, MD, Last Rate: 100 mL/hr at 01/15/21 0726, New Bag at 01/15/21 0726   REVIEW OF SYSTEMS:   [X] denotes positive finding, [ ] denotes negative finding Cardiac  Comments:  Chest pain or chest pressure:    Shortness of breath upon exertion:    Short of breath when lying flat:    Irregular heart rhythm:        Vascular    Pain in calf, thigh, or hip brought on by ambulation: x   Pain in feet at night that wakes you up from your sleep:  x   Blood clot in your veins:    Leg swelling:         Pulmonary    Oxygen at home:    Productive cough:     Wheezing:         Neurologic    Sudden weakness in arms or legs:     Sudden numbness in arms or legs:     Sudden onset of difficulty speaking or slurred speech:    Temporary loss of vision in one eye:     Problems with dizziness:         Gastrointestinal    Blood in stool:     Vomited blood:         Genitourinary    Burning when urinating:     Blood in urine:        Psychiatric    Major depression:         Hematologic    Bleeding problems:    Problems with blood clotting too easily:        Skin    Rashes or ulcers:        Constitutional    Fever or chills:     Hung Rhinesmith N Neeraj Housand, MD  Office: [phone] 01/15/2021 

## 2021-01-23 ENCOUNTER — Encounter: Payer: Self-pay | Admitting: Pulmonary Disease

## 2021-01-23 DIAGNOSIS — J449 Chronic obstructive pulmonary disease, unspecified: Secondary | ICD-10-CM | POA: Diagnosis not present

## 2021-01-30 ENCOUNTER — Telehealth (HOSPITAL_COMMUNITY): Payer: Self-pay

## 2021-01-30 DIAGNOSIS — M5416 Radiculopathy, lumbar region: Secondary | ICD-10-CM | POA: Diagnosis not present

## 2021-01-30 NOTE — Telephone Encounter (Signed)
Called patient to see if she is interested in the Pulmonary Rehab Program. Patient expressed interest. Explained scheduling process and went over insurance, patient verbalized understanding. Will contact patient for scheduling at a later date. (1-3 months) 

## 2021-01-30 NOTE — Telephone Encounter (Signed)
Pt insurance is active and benefits verified through [facility]. Co-pay $10.00, DED $0.00/$0.00 met, out of pocket $3,900.00/$613.36 met, co-insurance 0%. No pre-authorization required. [employer], 01/30/21 @ 210ZX, YOF#[id number]   Will contact patient to see if she is interested in the Pulmonary Rehab Program.

## 2021-02-02 NOTE — Progress Notes (Signed)
[facility] [place]  ASSESSMENT / PLAN: Norma Scott is a 70 y.o. 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  Patient counseled patients with chronic limb threatening ischemia have an annual risk of cardiovascular mortality of 25% and a annual amputation risk of 25%. Aggressive risk factor modification and intervention for limb salvage is indicated.  Recommend the following which can slow the progression of atherosclerosis and reduce the risk of major adverse cardiac / limb events:  Complete cessation from all tobacco products. Blood glucose control with goal A1c < 7%. Blood pressure control with goal blood pressure < 140/90 mmHg. Lipid reduction therapy with goal LDL-C <100 mg/dL (<73 if symptomatic from PAD).  Aspirin 81mg  PO QD.  Atorvastatin 40-80mg  PO QD (or other "high intensity" statin therapy).  Plan right femoral endarterectomy with retrograde common iliac artery stenting in OR 16 as schedule allows.  CHIEF COMPLAINT: right leg pain  HISTORY OF PRESENT ILLNESS: Norma Scott is a 70 y.o. female who returns to clinic status post angiogram for discussion of operative options.  The patient has typical symptoms of disabling claudication (cannot walk to mailbox without pain).  She also endorses pain at rest in the right foot.  She has no symptoms in her left leg where she has had a chronic iliac occlusion for many years.  I reviewed the angiogram results in detail with her and her son today.   VASCULAR RISK FACTORS: Positive history of cerebrovascular disease / stroke / transient ischemic attack. Positive history of coronary artery disease.  Negative history of diabetes mellitus. Last A1c 6.0 ("pre-diabetes). Positive history of smoking. + actively smoking. Positive history of hypertension. 3 drug regimen. Negative history of chronic kidney disease.  Positive history of chronic  obstructive pulmonary disease. Severe. Treated with multiple inhalers. Not on home O2.  Past Medical History:  Diagnosis Date   Allergy    allergic rhinitis   Arteriosclerotic coronary artery disease 05/21/2015   Asthma    CAD (coronary artery disease)    patient says no problems   dr 05/23/2015 pcp   Carotid artery occlusion    Cervical disc displacement    x2   COPD (chronic obstructive pulmonary disease) (HCC)    Esophageal reflux    Essential hypertension 05/21/2015   Family history of adverse reaction to anesthesia    mother hard to wake and affected b/p   Fibromyalgia    Gall stones    Generalized anxiety disorder    Hyperlipidemia    Lumbar disc herniation    L5-S1   Mini stroke (HCC)    TIA (transient ischemic attack) 05/21/2015    Past Surgical History:  Procedure Laterality Date   ABDOMINAL AORTOGRAM W/LOWER EXTREMITY N/A 01/15/2021   Procedure: ABDOMINAL AORTOGRAM W/LOWER EXTREMITY;  Surgeon: 01/17/2021, MD;  Location: [facility];  Service: Cardiovascular;  Laterality: N/A;   BACK SURGERY  06/01/2012   lumbar   CAROTID ENDARTERECTOMY  02/01/2009   Right   CERVICAL FUSION     1995, 1996   CHOLECYSTECTOMY     CHOLECYSTECTOMY     ECTOPIC PREGNANCY SURGERY  89   ERCP N/A 11/20/2015   Procedure: ENDOSCOPIC RETROGRADE CHOLANGIOPANCREATOGRAPHY (ERCP);  Surgeon: 11/22/2015, MD;  Location: [facility];  Service: Endoscopy;  Laterality: N/A;   EUS N/A 11/20/2015   Procedure: UPPER ENDOSCOPIC ULTRASOUND (EUS) RADIAL;  Surgeon: 11/22/2015, MD;  Location: [facility];  Service: Endoscopy;  Laterality: N/A;   LOOP RECORDER INSERTION N/A 02/23/2020   Procedure: LOOP RECORDER INSERTION;  Surgeon: Regan Lemming, MD;  Location: [facility];  Service: Cardiovascular;  Laterality: N/A;   TUBAL LIGATION      Family History  Problem Relation Age of Onset   Stroke Mother    Hypertension Mother    Hyperlipidemia Mother    Seizures Mother         Grand mal seizures   Emphysema Mother    Diabetes Father    Heart disease Father        Heart Disease before age 25   Breast cancer Maternal Aunt    Heart disease Paternal Uncle    Heart attack Paternal Uncle    Stroke Maternal Grandmother    Heart Problems Maternal Grandmother    Cancer Maternal Grandfather        stomach cancer   Heart attack Maternal Grandfather    Cancer Paternal Grandmother 87   Heart disease Paternal Grandfather    Heart attack Paternal Grandfather    Deep vein thrombosis Brother    Asthma Brother     Social History   Socioeconomic History   Marital status: Divorced    Spouse name: Not on file   Number of children: Not on file   Years of education: Not on file   Highest education level: Not on file  Occupational History   Not on file  Tobacco Use   Smoking status: Former    Packs/day: 0.50    Years: 45.00    Pack years: 22.50    Types: Cigarettes    Quit date: 11/18/2020    Years since quitting: 0.2   Smokeless tobacco: Never  Substance and Sexual Activity   Alcohol use: Yes    Alcohol/week: 0.0 standard drinks    Comment: 2 times a year   Drug use: No   Sexual activity: Not on file  Other Topics Concern   Not on file  Social History Narrative   Epworth Sleepiness Scale = 10 (as of 05/21/2015)   Social Determinants of Health   Financial Resource Strain: Not on file  Food Insecurity: Not on file  Transportation Needs: Not on file  Physical Activity: Not on file  Stress: Not on file  Social Connections: Not on file  Intimate Partner Violence: Not on file    Allergies  Allergen Reactions   Bupropion Other (See Comments)    Hallucinations    Septra [Sulfamethoxazole-Trimethoprim] Nausea And Vomiting   Sulfa Antibiotics Nausea And Vomiting   Zithromax [Azithromycin] Hives   Cymbalta [Duloxetine Hcl] Other (See Comments)    fatigue    Current Outpatient Medications  Medication Sig Dispense Refill   acetaminophen  (TYLENOL) 325 MG tablet Take 650 mg by mouth every 6 (six) hours as needed for moderate pain or headache.     albuterol (PROVENTIL HFA) 108 (90 Base) MCG/ACT inhaler Inhale 1 puff into the lungs every 6 (six) hours as needed for wheezing or shortness of breath. 1 Inhaler 0   albuterol (PROVENTIL) (2.5 MG/3ML) 0.083% nebulizer solution Take 3 mLs (2.5 mg total) by nebulization every 6 (six) hours. And PRN 360 mL 11   alendronate (FOSAMAX) 70 MG tablet Take 70 mg by mouth every Saturday.     amLODipine (NORVASC) 10 MG tablet Take 10 mg by mouth daily.     Ascorbic Acid (VITAMIN C PO) Take 1 tablet by mouth daily.     Ascorbic Acid (VITAMIN C)  100 MG tablet Take 100 mg by mouth daily.     aspirin EC 81 MG EC tablet Take 1 tablet (81 mg total) by mouth daily. Swallow whole. 30 tablet 11   atorvastatin (LIPITOR) 40 MG tablet Take 40 mg by mouth at bedtime.      Calcium Carb-Cholecalciferol (CALCIUM 600 + D PO) Take 1 tablet by mouth daily.     cetirizine (ZYRTEC) 10 MG tablet Take 10 mg by mouth daily.     clonazePAM (KLONOPIN) 0.5 MG tablet Take 0.5 mg by mouth See admin instructions. Take 0.5 mg by mouth twice daily, may take a third 0.5 mg dose as needed for anxiety     Coenzyme Q10 (COQ-10) 100 MG CAPS Take 100 mg by mouth daily.     diphenhydrAMINE HCl (ZZZQUIL) 50 MG/30ML LIQD Take 50 mg by mouth at bedtime.     FLUoxetine (PROZAC) 20 MG capsule Take 60 mg by mouth daily.      fluticasone (FLONASE) 50 MCG/ACT nasal spray Place 2 sprays into both nostrils daily. 15.8 mL 6   furosemide (LASIX) 20 MG tablet Take 20 mg by mouth daily.     gabapentin (NEURONTIN) 300 MG capsule Take 300-600 mg by mouth See admin instructions. Take 300 mg in the morning, 300 mg at noon, and 600 mg at bedtime     hydrocortisone cream 1 % Apply 1 application topically daily.     ipratropium (ATROVENT) 0.03 % nasal spray Place 2 sprays into both nostrils every 12 (twelve) hours. 30 mL 12   lisinopril (ZESTRIL) 30 MG  tablet Take 30 mg by mouth daily.      metoprolol tartrate (LOPRESSOR) 25 MG tablet Take 25 mg by mouth 2 (two) times daily.     Multiple Vitamin (MULTIVITAMIN WITH MINERALS) TABS tablet Take 1 tablet by mouth daily.     olopatadine (PATANOL) 0.1 % ophthalmic solution Place 1 drop into both eyes daily as needed (dry/itchy eyes).     omeprazole (PRILOSEC) 40 MG capsule Take 40 mg by mouth daily.      potassium gluconate 595 (99 K) MG TABS tablet Take 595 mg by mouth daily.     tiZANidine (ZANAFLEX) 4 MG tablet Take 4 mg by mouth every 8 (eight) hours as needed for muscle spasms.     TRELEGY ELLIPTA 100-62.5-25 MCG/INH AEPB Inhale 1 puff into the lungs daily.     varenicline (CHANTIX) 1 MG tablet Take 1 tablet by mouth twice daily (Patient taking differently: Take 1 mg by mouth daily.) 42 tablet 2   No current facility-administered medications for this visit.    REVIEW OF SYSTEMS:  [X]  denotes positive finding, [ ]  denotes negative finding Cardiac  Comments:  Chest pain or chest pressure:    Shortness of breath upon exertion:    Short of breath when lying flat:    Irregular heart rhythm:        Vascular    Pain in calf, thigh, or hip brought on by ambulation:    Pain in feet at night that wakes you up from your sleep:     Blood clot in your veins:    Leg swelling:         Pulmonary    Oxygen at home:    Productive cough:     Wheezing:         Neurologic    Sudden weakness in arms or legs:     Sudden numbness in arms or legs:  Sudden onset of difficulty speaking or slurred speech:    Temporary loss of vision in one eye:     Problems with dizziness:         Gastrointestinal    Blood in stool:     Vomited blood:         Genitourinary    Burning when urinating:     Blood in urine:        Psychiatric    Major depression:         Hematologic    Bleeding problems:    Problems with blood clotting too easily:        Skin    Rashes or ulcers:        Constitutional     Fever or chills:      PHYSICAL EXAM Vitals:   02/04/21 1053  BP: 135/75  Pulse: 63  Resp: 18  Temp: 98.4 F (36.9 C)  TempSrc: Temporal  SpO2: 96%  Weight: 193 lb 1.6 oz (87.6 kg)  Height: 4\' 11"  (1.499 m)    Constitutional: well appearing. no distress. Appears well nourished.  Neurologic: CN intact. no focal findings. no sensory loss. Psychiatric:  Mood and affect symmetric and appropriate. Eyes:  No icterus. No conjunctival pallor. Ears, nose, throat:  mucous membranes moist. Midline trachea.  Cardiac: regular rate and rhythm.  Respiratory:  unlabored. Abdominal:  soft, non-tender, non-distended.  Peripheral vascular: no palpable pedal pulses. Extremity: no edema. no cyanosis. no pallor.  Skin: no gangrene. no ulceration.  Lymphatic: no Stemmer's sign. no palpable lymphadenopathy.  PERTINENT LABORATORY AND RADIOLOGIC DATA  Most recent CBC CBC Latest Ref Rng & Units 01/15/2021 02/23/2020 02/22/2020  WBC 4.0 - 10.5 K/uL - 8.4 9.9  Hemoglobin 12.0 - 15.0 g/dL 02/24/2020 79.8 92.1  Hematocrit 36.0 - 46.0 % 38.0 43.9 41.7  Platelets 150 - 400 K/uL - 211 209     Most recent CMP CMP Latest Ref Rng & Units 01/15/2021 02/23/2020 02/22/2020  Glucose 70 - 99 mg/dL 02/24/2020) 174(Y) 814(G)  BUN 8 - 23 mg/dL 16 6(L) 7(L)  Creatinine 0.44 - 1.00 mg/dL 818(H) 6.31(S 9.70  Sodium 135 - 145 mmol/L 138 141 138  Potassium 3.5 - 5.1 mmol/L 4.2 4.1 3.3(L)  Chloride 98 - 111 mmol/L 99 105 101  CO2 22 - 32 mmol/L - 29 26  Calcium 8.9 - 10.3 mg/dL - 8.8(L) 8.8(L)  Total Protein 6.5 - 8.1 g/dL - - 6.6  Total Bilirubin 0.3 - 1.2 mg/dL - - 0.6  Alkaline Phos 38 - 126 U/L - - 89  AST 15 - 41 U/L - - 26  ALT 0 - 44 U/L - - 19    Renal function Estimated Creatinine Clearance: 42.9 mL/min (A) (by C-G formula based on SCr of 1.2 mg/dL (H)).  Hgb A1c MFr Bld (%)  Date Value  02/22/2020 6.0 (H)    LDL Cholesterol  Date Value Ref Range Status  02/22/2020 70 0 - 99 mg/dL Final    Comment:            Total Cholesterol/HDL:CHD Risk Coronary Heart Disease Risk Table                     Men   Women  1/2 Average Risk   3.4   3.3  Average Risk       5.0   4.4  2 X Average Risk   9.6   7.1  3 X Average Risk  23.4  11.0        Use the calculated Patient Ratio above and the CHD Risk Table to determine the patient's CHD Risk.        ATP III CLASSIFICATION (LDL):  <100     mg/dL   Optimal  100-129  mg/dL   Near or Above                    Optimal  130-159  mg/dL   Borderline  160-189  mg/dL   High  >190     mg/dL   Very High Performed at [facility], [address] [place], [place] [address]     Transradial aortogram, lower extremity angiogram personally reviewed. Aortogram unremarkable Occluded left common and internal iliac arteries with reconstitution of the external iliac artery Right common iliac stenosis (>80%) Right internal and external iliac arteries without significant stenosis Bilateral severe common femoral artery stenosis (>90%) Normal runoff from SFA --> tibial arteries bilaterally  Maghan Jessee N. Crosby Oriordan, MD [facility] [place] Office Phone Number: [phone] 02/02/2021 2:45 PM    

## 2021-02-02 NOTE — H&P (View-Only) (Signed)
[facility] [place]  ASSESSMENT / PLAN: Norma Scott is a 70 y.o. 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  Patient counseled patients with chronic limb threatening ischemia have an annual risk of cardiovascular mortality of 25% and a annual amputation risk of 25%. Aggressive risk factor modification and intervention for limb salvage is indicated.  Recommend the following which can slow the progression of atherosclerosis and reduce the risk of major adverse cardiac / limb events:  Complete cessation from all tobacco products. Blood glucose control with goal A1c < 7%. Blood pressure control with goal blood pressure < 140/90 mmHg. Lipid reduction therapy with goal LDL-C <100 mg/dL (<73 if symptomatic from PAD).  Aspirin 81mg  PO QD.  Atorvastatin 40-80mg  PO QD (or other "high intensity" statin therapy).  Plan right femoral endarterectomy with retrograde common iliac artery stenting in OR 16 as schedule allows.  CHIEF COMPLAINT: right leg pain  HISTORY OF PRESENT ILLNESS: Norma Scott is a 70 y.o. female who returns to clinic status post angiogram for discussion of operative options.  The patient has typical symptoms of disabling claudication (cannot walk to mailbox without pain).  She also endorses pain at rest in the right foot.  She has no symptoms in her left leg where she has had a chronic iliac occlusion for many years.  I reviewed the angiogram results in detail with her and her son today.   VASCULAR RISK FACTORS: Positive history of cerebrovascular disease / stroke / transient ischemic attack. Positive history of coronary artery disease.  Negative history of diabetes mellitus. Last A1c 6.0 ("pre-diabetes). Positive history of smoking. + actively smoking. Positive history of hypertension. 3 drug regimen. Negative history of chronic kidney disease.  Positive history of chronic  obstructive pulmonary disease. Severe. Treated with multiple inhalers. Not on home O2.  Past Medical History:  Diagnosis Date   Allergy    allergic rhinitis   Arteriosclerotic coronary artery disease 05/21/2015   Asthma    CAD (coronary artery disease)    patient says no problems   dr 05/23/2015 pcp   Carotid artery occlusion    Cervical disc displacement    x2   COPD (chronic obstructive pulmonary disease) (HCC)    Esophageal reflux    Essential hypertension 05/21/2015   Family history of adverse reaction to anesthesia    mother hard to wake and affected b/p   Fibromyalgia    Gall stones    Generalized anxiety disorder    Hyperlipidemia    Lumbar disc herniation    L5-S1   Mini stroke (HCC)    TIA (transient ischemic attack) 05/21/2015    Past Surgical History:  Procedure Laterality Date   ABDOMINAL AORTOGRAM W/LOWER EXTREMITY N/A 01/15/2021   Procedure: ABDOMINAL AORTOGRAM W/LOWER EXTREMITY;  Surgeon: 01/17/2021, MD;  Location: [facility];  Service: Cardiovascular;  Laterality: N/A;   BACK SURGERY  06/01/2012   lumbar   CAROTID ENDARTERECTOMY  02/01/2009   Right   CERVICAL FUSION     1995, 1996   CHOLECYSTECTOMY     CHOLECYSTECTOMY     ECTOPIC PREGNANCY SURGERY  89   ERCP N/A 11/20/2015   Procedure: ENDOSCOPIC RETROGRADE CHOLANGIOPANCREATOGRAPHY (ERCP);  Surgeon: 11/22/2015, MD;  Location: [facility];  Service: Endoscopy;  Laterality: N/A;   EUS N/A 11/20/2015   Procedure: UPPER ENDOSCOPIC ULTRASOUND (EUS) RADIAL;  Surgeon: 11/22/2015, MD;  Location: [facility];  Service: Endoscopy;  Laterality: N/A;   LOOP RECORDER INSERTION N/A 02/23/2020   Procedure: LOOP RECORDER INSERTION;  Surgeon: Regan Lemming, MD;  Location: [facility];  Service: Cardiovascular;  Laterality: N/A;   TUBAL LIGATION      Family History  Problem Relation Age of Onset   Stroke Mother    Hypertension Mother    Hyperlipidemia Mother    Seizures Mother         Grand mal seizures   Emphysema Mother    Diabetes Father    Heart disease Father        Heart Disease before age 25   Breast cancer Maternal Aunt    Heart disease Paternal Uncle    Heart attack Paternal Uncle    Stroke Maternal Grandmother    Heart Problems Maternal Grandmother    Cancer Maternal Grandfather        stomach cancer   Heart attack Maternal Grandfather    Cancer Paternal Grandmother 87   Heart disease Paternal Grandfather    Heart attack Paternal Grandfather    Deep vein thrombosis Brother    Asthma Brother     Social History   Socioeconomic History   Marital status: Divorced    Spouse name: Not on file   Number of children: Not on file   Years of education: Not on file   Highest education level: Not on file  Occupational History   Not on file  Tobacco Use   Smoking status: Former    Packs/day: 0.50    Years: 45.00    Pack years: 22.50    Types: Cigarettes    Quit date: 11/18/2020    Years since quitting: 0.2   Smokeless tobacco: Never  Substance and Sexual Activity   Alcohol use: Yes    Alcohol/week: 0.0 standard drinks    Comment: 2 times a year   Drug use: No   Sexual activity: Not on file  Other Topics Concern   Not on file  Social History Narrative   Epworth Sleepiness Scale = 10 (as of 05/21/2015)   Social Determinants of Health   Financial Resource Strain: Not on file  Food Insecurity: Not on file  Transportation Needs: Not on file  Physical Activity: Not on file  Stress: Not on file  Social Connections: Not on file  Intimate Partner Violence: Not on file    Allergies  Allergen Reactions   Bupropion Other (See Comments)    Hallucinations    Septra [Sulfamethoxazole-Trimethoprim] Nausea And Vomiting   Sulfa Antibiotics Nausea And Vomiting   Zithromax [Azithromycin] Hives   Cymbalta [Duloxetine Hcl] Other (See Comments)    fatigue    Current Outpatient Medications  Medication Sig Dispense Refill   acetaminophen  (TYLENOL) 325 MG tablet Take 650 mg by mouth every 6 (six) hours as needed for moderate pain or headache.     albuterol (PROVENTIL HFA) 108 (90 Base) MCG/ACT inhaler Inhale 1 puff into the lungs every 6 (six) hours as needed for wheezing or shortness of breath. 1 Inhaler 0   albuterol (PROVENTIL) (2.5 MG/3ML) 0.083% nebulizer solution Take 3 mLs (2.5 mg total) by nebulization every 6 (six) hours. And PRN 360 mL 11   alendronate (FOSAMAX) 70 MG tablet Take 70 mg by mouth every Saturday.     amLODipine (NORVASC) 10 MG tablet Take 10 mg by mouth daily.     Ascorbic Acid (VITAMIN C PO) Take 1 tablet by mouth daily.     Ascorbic Acid (VITAMIN C)  100 MG tablet Take 100 mg by mouth daily.     aspirin EC 81 MG EC tablet Take 1 tablet (81 mg total) by mouth daily. Swallow whole. 30 tablet 11   atorvastatin (LIPITOR) 40 MG tablet Take 40 mg by mouth at bedtime.      Calcium Carb-Cholecalciferol (CALCIUM 600 + D PO) Take 1 tablet by mouth daily.     cetirizine (ZYRTEC) 10 MG tablet Take 10 mg by mouth daily.     clonazePAM (KLONOPIN) 0.5 MG tablet Take 0.5 mg by mouth See admin instructions. Take 0.5 mg by mouth twice daily, may take a third 0.5 mg dose as needed for anxiety     Coenzyme Q10 (COQ-10) 100 MG CAPS Take 100 mg by mouth daily.     diphenhydrAMINE HCl (ZZZQUIL) 50 MG/30ML LIQD Take 50 mg by mouth at bedtime.     FLUoxetine (PROZAC) 20 MG capsule Take 60 mg by mouth daily.      fluticasone (FLONASE) 50 MCG/ACT nasal spray Place 2 sprays into both nostrils daily. 15.8 mL 6   furosemide (LASIX) 20 MG tablet Take 20 mg by mouth daily.     gabapentin (NEURONTIN) 300 MG capsule Take 300-600 mg by mouth See admin instructions. Take 300 mg in the morning, 300 mg at noon, and 600 mg at bedtime     hydrocortisone cream 1 % Apply 1 application topically daily.     ipratropium (ATROVENT) 0.03 % nasal spray Place 2 sprays into both nostrils every 12 (twelve) hours. 30 mL 12   lisinopril (ZESTRIL) 30 MG  tablet Take 30 mg by mouth daily.      metoprolol tartrate (LOPRESSOR) 25 MG tablet Take 25 mg by mouth 2 (two) times daily.     Multiple Vitamin (MULTIVITAMIN WITH MINERALS) TABS tablet Take 1 tablet by mouth daily.     olopatadine (PATANOL) 0.1 % ophthalmic solution Place 1 drop into both eyes daily as needed (dry/itchy eyes).     omeprazole (PRILOSEC) 40 MG capsule Take 40 mg by mouth daily.      potassium gluconate 595 (99 K) MG TABS tablet Take 595 mg by mouth daily.     tiZANidine (ZANAFLEX) 4 MG tablet Take 4 mg by mouth every 8 (eight) hours as needed for muscle spasms.     TRELEGY ELLIPTA 100-62.5-25 MCG/INH AEPB Inhale 1 puff into the lungs daily.     varenicline (CHANTIX) 1 MG tablet Take 1 tablet by mouth twice daily (Patient taking differently: Take 1 mg by mouth daily.) 42 tablet 2   No current facility-administered medications for this visit.    REVIEW OF SYSTEMS:  [X]  denotes positive finding, [ ]  denotes negative finding Cardiac  Comments:  Chest pain or chest pressure:    Shortness of breath upon exertion:    Short of breath when lying flat:    Irregular heart rhythm:        Vascular    Pain in calf, thigh, or hip brought on by ambulation:    Pain in feet at night that wakes you up from your sleep:     Blood clot in your veins:    Leg swelling:         Pulmonary    Oxygen at home:    Productive cough:     Wheezing:         Neurologic    Sudden weakness in arms or legs:     Sudden numbness in arms or legs:  Sudden onset of difficulty speaking or slurred speech:    Temporary loss of vision in one eye:     Problems with dizziness:         Gastrointestinal    Blood in stool:     Vomited blood:         Genitourinary    Burning when urinating:     Blood in urine:        Psychiatric    Major depression:         Hematologic    Bleeding problems:    Problems with blood clotting too easily:        Skin    Rashes or ulcers:        Constitutional     Fever or chills:      PHYSICAL EXAM Vitals:   02/04/21 1053  BP: 135/75  Pulse: 63  Resp: 18  Temp: 98.4 F (36.9 C)  TempSrc: Temporal  SpO2: 96%  Weight: 193 lb 1.6 oz (87.6 kg)  Height: 4\' 11"  (1.499 m)    Constitutional: well appearing. no distress. Appears well nourished.  Neurologic: CN intact. no focal findings. no sensory loss. Psychiatric:  Mood and affect symmetric and appropriate. Eyes:  No icterus. No conjunctival pallor. Ears, nose, throat:  mucous membranes moist. Midline trachea.  Cardiac: regular rate and rhythm.  Respiratory:  unlabored. Abdominal:  soft, non-tender, non-distended.  Peripheral vascular: no palpable pedal pulses. Extremity: no edema. no cyanosis. no pallor.  Skin: no gangrene. no ulceration.  Lymphatic: no Stemmer's sign. no palpable lymphadenopathy.  PERTINENT LABORATORY AND RADIOLOGIC DATA  Most recent CBC CBC Latest Ref Rng & Units 01/15/2021 02/23/2020 02/22/2020  WBC 4.0 - 10.5 K/uL - 8.4 9.9  Hemoglobin 12.0 - 15.0 g/dL 02/24/2020 79.8 92.1  Hematocrit 36.0 - 46.0 % 38.0 43.9 41.7  Platelets 150 - 400 K/uL - 211 209     Most recent CMP CMP Latest Ref Rng & Units 01/15/2021 02/23/2020 02/22/2020  Glucose 70 - 99 mg/dL 02/24/2020) 174(Y) 814(G)  BUN 8 - 23 mg/dL 16 6(L) 7(L)  Creatinine 0.44 - 1.00 mg/dL 818(H) 6.31(S 9.70  Sodium 135 - 145 mmol/L 138 141 138  Potassium 3.5 - 5.1 mmol/L 4.2 4.1 3.3(L)  Chloride 98 - 111 mmol/L 99 105 101  CO2 22 - 32 mmol/L - 29 26  Calcium 8.9 - 10.3 mg/dL - 8.8(L) 8.8(L)  Total Protein 6.5 - 8.1 g/dL - - 6.6  Total Bilirubin 0.3 - 1.2 mg/dL - - 0.6  Alkaline Phos 38 - 126 U/L - - 89  AST 15 - 41 U/L - - 26  ALT 0 - 44 U/L - - 19    Renal function Estimated Creatinine Clearance: 42.9 mL/min (A) (by C-G formula based on SCr of 1.2 mg/dL (H)).  Hgb A1c MFr Bld (%)  Date Value  02/22/2020 6.0 (H)    LDL Cholesterol  Date Value Ref Range Status  02/22/2020 70 0 - 99 mg/dL Final    Comment:            Total Cholesterol/HDL:CHD Risk Coronary Heart Disease Risk Table                     Men   Women  1/2 Average Risk   3.4   3.3  Average Risk       5.0   4.4  2 X Average Risk   9.6   7.1  3 X Average Risk  23.4  11.0        Use the calculated Patient Ratio above and the CHD Risk Table to determine the patient's CHD Risk.        ATP III CLASSIFICATION (LDL):  <100     mg/dL   Optimal  161-096  mg/dL   Near or Above                    Optimal  130-159  mg/dL   Borderline  045-409  mg/dL   High  >811     mg/dL   Very High Performed at [facility], [address], [place], [place] [address]     Transradial aortogram, lower extremity angiogram personally reviewed. 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 Norma Antu, MD [facility] Phone Number: [phone] 02/02/2021 2:45 PM

## 2021-02-03 ENCOUNTER — Ambulatory Visit
Admission: RE | Admit: 2021-02-03 | Discharge: 2021-02-03 | Disposition: A | Discharge status: Home / Self Care | Payer: Medicare HMO | Source: Ambulatory Visit | Attending: Vascular Surgery | Admitting: Vascular Surgery

## 2021-02-03 ENCOUNTER — Other Ambulatory Visit: Payer: Self-pay

## 2021-02-03 DIAGNOSIS — K5730 Diverticulosis of large intestine without perforation or abscess without bleeding: Secondary | ICD-10-CM | POA: Diagnosis not present

## 2021-02-03 DIAGNOSIS — I739 Peripheral vascular disease, unspecified: Secondary | ICD-10-CM

## 2021-02-03 NOTE — Progress Notes (Signed)
Carelink Summary Report / Loop Recorder 

## 2021-02-04 ENCOUNTER — Encounter: Payer: Self-pay | Admitting: Vascular Surgery

## 2021-02-04 ENCOUNTER — Ambulatory Visit: Payer: Medicare HMO | Admitting: Vascular Surgery

## 2021-02-04 ENCOUNTER — Other Ambulatory Visit: Payer: Self-pay

## 2021-02-04 VITALS — BP 135/75 | HR 63 | Temp 98.4°F | Resp 18 | Ht 59.0 in | Wt 193.1 lb

## 2021-02-04 DIAGNOSIS — I70221 Atherosclerosis of native arteries of extremities with rest pain, right leg: Secondary | ICD-10-CM

## 2021-02-05 ENCOUNTER — Other Ambulatory Visit: Payer: Self-pay

## 2021-02-06 ENCOUNTER — Encounter (HOSPITAL_COMMUNITY): Payer: Self-pay | Admitting: *Deleted

## 2021-02-06 ENCOUNTER — Telehealth: Payer: Self-pay | Admitting: Pulmonary Disease

## 2021-02-06 DIAGNOSIS — R0602 Shortness of breath: Secondary | ICD-10-CM

## 2021-02-06 NOTE — Telephone Encounter (Signed)
Pulmonary rehab has been placed under SOB. Lm for [facility] to make aware.

## 2021-02-06 NOTE — Progress Notes (Signed)
Received pulmonary rehab referral from Dr. Francine Graven with the diagnosis of COPD.  Reviewed pt medical history and follow up appt from 6/27. Pt had PFT ordered back in March that was not completed.  With the diagnosis of COPD must have full PFT in order to substantiate the diagnosis COPD for insurance to reimburse.  Called and left message for Dr. Francine Graven nurse to either have PFT completed or look for another applicable diagnosis that is appropriate for this pt for pulmonary rehab. Alanson Aly, BSN Cardiac and Emergency planning/management officer

## 2021-02-06 NOTE — Telephone Encounter (Signed)
Spoke to [employer] with pulmonary rehab, who is requesting different dx code. COPD is not accepted by medicare, as patient has not had a previous PFT.  Dr. Francine Graven, please advise.

## 2021-02-07 ENCOUNTER — Encounter (HOSPITAL_COMMUNITY): Payer: Self-pay | Admitting: *Deleted

## 2021-02-07 NOTE — Progress Notes (Signed)
Received referral from Dr. Francine Graven for this pt to participate in pulmonary rehab with the the diagnosis of Shortness of Breath. Clinical review of pt follow up appt on 6/27 Pulmonary office note.  Pt with Covid Risk Score - 5. Pt appropriate for scheduling for Pulmonary rehab.  Will forward to support staff for scheduling when able as there is a wait list. Karlene Lineman RN, BSN Cardiac and Pulmonary Rehab Nurse Navigator

## 2021-02-09 ENCOUNTER — Other Ambulatory Visit: Payer: Self-pay | Admitting: Pulmonary Disease

## 2021-02-09 DIAGNOSIS — Z720 Tobacco use: Secondary | ICD-10-CM

## 2021-02-11 NOTE — Progress Notes (Signed)
Surgical Instructions    Your procedure is scheduled on Friday, July 22nd, 2022.  Report to [facility] Main Entrance "A" at 07:50 A.M., then check in with the Admitting office.  Call this number if you have problems the morning of surgery:  [phone]   If you have any questions prior to your surgery date call [phone]: Open Monday-Friday 8am-4pm    Remember:  Do not eat or drink after midnight the night before your surgery    Take these medicines the morning of surgery with A SIP OF WATER:  amLODipine (NORVASC) FLUoxetine (PROZAC) gabapentin (NEURONTIN)  ipratropium (ATROVENT)  loratadine (CLARITIN)  metoprolol tartrate (LOPRESSOR) omeprazole (PRILOSEC)  If needed:  acetaminophen (TYLENOL)  albuterol (PROVENTIL HFA) - please, bring the inhaler with you albuterol (PROVENTIL)  clonazePAM (KLONOPIN)  olopatadine (PATANOL)  tiZANidine (ZANAFLEX) TRELEGY ELLIPTA   Follow your surgeon's instructions on when to stop Aspirin.  If no instructions were given by your surgeon then you will need to call the office to get those instructions.     As of today, STOP taking any Aspirin (unless otherwise instructed by your surgeon) Aleve, Naproxen, Ibuprofen, Motrin, Advil, Goody's, BC's, all herbal medications, fish oil, and all vitamins.          Do not wear jewelry or makeup Do not wear lotions, powders, perfumes, or deodorant. Do not shave 48 hours prior to surgery.   Do not bring valuables to the hospital. DO Not wear nail polish, gel polish, artificial nails, or any other type of covering on natural nails including finger and toenails. If patients have artificial nails, gel coating, etc. that need to be removed by a nail salon please have this removed prior to surgery or surgery may need to be canceled/delayed if the surgeon/ anesthesia feels like the patient is unable to be adequately monitored.             [facility] is not responsible for any belongings or valuables.  Do  NOT Smoke (Tobacco/Vaping) or drink Alcohol 24 hours prior to your procedure If you use a CPAP at night, you may bring all equipment for your overnight stay.   Contacts, glasses, dentures or bridgework may not be worn into surgery, please bring cases for these belongings   For patients admitted to the hospital, discharge time will be determined by your treatment team.   Patients discharged the day of surgery will not be allowed to drive home, and someone needs to stay with them for 24 hours.  ONLY 1 SUPPORT PERSON MAY BE PRESENT WHILE YOU ARE IN SURGERY. IF YOU ARE TO BE ADMITTED ONCE YOU ARE IN YOUR ROOM YOU WILL BE ALLOWED TWO (2) VISITORS.  Minor children may have two parents present. Special consideration for safety and communication needs will be reviewed on a case by case basis.  Special instructions:    Oral Hygiene is also important to reduce your risk of infection.  Remember - BRUSH YOUR TEETH THE MORNING OF SURGERY WITH YOUR REGULAR TOOTHPASTE   [facility]- Preparing For Surgery  Before surgery, you can play an important role. Because skin is not sterile, your skin needs to be as free of germs as possible. You can reduce the number of germs on your skin by washing with CHG (chlorahexidine gluconate) Soap before surgery.  CHG is an antiseptic cleaner which kills germs and bonds with the skin to continue killing germs even after washing.     Please do not use if you have an  allergy to CHG or antibacterial soaps. If your skin becomes reddened/irritated stop using the CHG.  Do not shave (including legs and underarms) for at least 48 hours prior to first CHG shower. It is OK to shave your face.  Please follow these instructions carefully.     Shower the NIGHT BEFORE SURGERY and the MORNING OF SURGERY with CHG Soap.   If you chose to wash your hair, wash your hair first as usual with your normal shampoo. After you shampoo, rinse your hair and body thoroughly to remove the shampoo.   Then [employer] and genitals (private parts) with your normal soap and rinse thoroughly to remove soap.  After that Use CHG Soap as you would any other liquid soap. You can apply CHG directly to the skin and wash gently with a scrungie or a clean washcloth.   Apply the CHG Soap to your body ONLY FROM THE NECK DOWN.  Do not use on open wounds or open sores. Avoid contact with your eyes, ears, mouth and genitals (private parts). Wash Face and genitals (private parts)  with your normal soap.   Wash thoroughly, paying special attention to the area where your surgery will be performed.  Thoroughly rinse your body with warm water from the neck down.  DO NOT shower/wash with your normal soap after using and rinsing off the CHG Soap.  Pat yourself dry with a CLEAN TOWEL.  Wear CLEAN PAJAMAS to bed the night before surgery  Place CLEAN SHEETS on your bed the night before your surgery  DO NOT SLEEP WITH PETS.   Day of Surgery:  Take a shower with CHG soap. Wear Clean/Comfortable clothing the morning of surgery Do not apply any deodorants/lotions.   Remember to brush your teeth WITH YOUR REGULAR TOOTHPASTE.   Please read over the following fact sheets that you were given.

## 2021-02-12 ENCOUNTER — Encounter (HOSPITAL_COMMUNITY)
Admission: RE | Admit: 2021-02-12 | Discharge: 2021-02-12 | Disposition: A | Discharge status: Home / Self Care | Payer: Medicare HMO | Source: Ambulatory Visit | Attending: Vascular Surgery | Admitting: Vascular Surgery

## 2021-02-12 ENCOUNTER — Encounter (HOSPITAL_COMMUNITY): Payer: Self-pay

## 2021-02-12 ENCOUNTER — Other Ambulatory Visit: Payer: Self-pay

## 2021-02-12 DIAGNOSIS — Z833 Family history of diabetes mellitus: Secondary | ICD-10-CM | POA: Diagnosis not present

## 2021-02-12 DIAGNOSIS — Z881 Allergy status to other antibiotic agents status: Secondary | ICD-10-CM | POA: Diagnosis not present

## 2021-02-12 DIAGNOSIS — Z7982 Long term (current) use of aspirin: Secondary | ICD-10-CM | POA: Diagnosis not present

## 2021-02-12 DIAGNOSIS — J449 Chronic obstructive pulmonary disease, unspecified: Secondary | ICD-10-CM | POA: Diagnosis present

## 2021-02-12 DIAGNOSIS — Z7983 Long term (current) use of bisphosphonates: Secondary | ICD-10-CM | POA: Diagnosis not present

## 2021-02-12 DIAGNOSIS — Z79899 Other long term (current) drug therapy: Secondary | ICD-10-CM | POA: Diagnosis not present

## 2021-02-12 DIAGNOSIS — Z888 Allergy status to other drugs, medicaments and biological substances status: Secondary | ICD-10-CM | POA: Diagnosis not present

## 2021-02-12 DIAGNOSIS — Z882 Allergy status to sulfonamides status: Secondary | ICD-10-CM | POA: Diagnosis not present

## 2021-02-12 DIAGNOSIS — Z825 Family history of asthma and other chronic lower respiratory diseases: Secondary | ICD-10-CM | POA: Diagnosis not present

## 2021-02-12 DIAGNOSIS — I679 Cerebrovascular disease, unspecified: Secondary | ICD-10-CM | POA: Diagnosis present

## 2021-02-12 DIAGNOSIS — Z8249 Family history of ischemic heart disease and other diseases of the circulatory system: Secondary | ICD-10-CM | POA: Diagnosis not present

## 2021-02-12 DIAGNOSIS — Z823 Family history of stroke: Secondary | ICD-10-CM | POA: Diagnosis not present

## 2021-02-12 DIAGNOSIS — I2510 Atherosclerotic heart disease of native coronary artery without angina pectoris: Secondary | ICD-10-CM | POA: Diagnosis present

## 2021-02-12 DIAGNOSIS — Z87891 Personal history of nicotine dependence: Secondary | ICD-10-CM | POA: Diagnosis not present

## 2021-02-12 DIAGNOSIS — E871 Hypo-osmolality and hyponatremia: Secondary | ICD-10-CM | POA: Diagnosis not present

## 2021-02-12 DIAGNOSIS — Z8673 Personal history of transient ischemic attack (TIA), and cerebral infarction without residual deficits: Secondary | ICD-10-CM | POA: Diagnosis not present

## 2021-02-12 DIAGNOSIS — Z820 Family history of epilepsy and other diseases of the nervous system: Secondary | ICD-10-CM | POA: Diagnosis not present

## 2021-02-12 DIAGNOSIS — R7303 Prediabetes: Secondary | ICD-10-CM | POA: Diagnosis present

## 2021-02-12 DIAGNOSIS — Z83438 Family history of other disorder of lipoprotein metabolism and other lipidemia: Secondary | ICD-10-CM | POA: Diagnosis not present

## 2021-02-12 DIAGNOSIS — Z20822 Contact with and (suspected) exposure to covid-19: Secondary | ICD-10-CM | POA: Diagnosis present

## 2021-02-12 DIAGNOSIS — I10 Essential (primary) hypertension: Secondary | ICD-10-CM | POA: Diagnosis present

## 2021-02-12 DIAGNOSIS — Z7951 Long term (current) use of inhaled steroids: Secondary | ICD-10-CM | POA: Diagnosis not present

## 2021-02-12 DIAGNOSIS — J441 Chronic obstructive pulmonary disease with (acute) exacerbation: Secondary | ICD-10-CM | POA: Diagnosis not present

## 2021-02-12 DIAGNOSIS — Z981 Arthrodesis status: Secondary | ICD-10-CM | POA: Diagnosis not present

## 2021-02-12 DIAGNOSIS — I70221 Atherosclerosis of native arteries of extremities with rest pain, right leg: Secondary | ICD-10-CM | POA: Diagnosis present

## 2021-02-12 DIAGNOSIS — Z01812 Encounter for preprocedural laboratory examination: Secondary | ICD-10-CM | POA: Insufficient documentation

## 2021-02-12 DIAGNOSIS — I708 Atherosclerosis of other arteries: Secondary | ICD-10-CM | POA: Diagnosis present

## 2021-02-12 DIAGNOSIS — I739 Peripheral vascular disease, unspecified: Secondary | ICD-10-CM | POA: Diagnosis not present

## 2021-02-12 HISTORY — DX: Headache, unspecified: R51.9

## 2021-02-12 HISTORY — DX: Cerebral infarction, unspecified: I63.9

## 2021-02-12 HISTORY — DX: Peripheral vascular disease, unspecified: I73.9

## 2021-02-12 HISTORY — DX: Dyspnea, unspecified: R06.00

## 2021-02-12 HISTORY — DX: Sleep apnea, unspecified: G47.30

## 2021-02-12 LAB — PROTIME-INR
INR: 0.9 (ref 0.8–1.2)
Prothrombin Time: 12.5 seconds (ref 11.4–15.2)

## 2021-02-12 LAB — COMPREHENSIVE METABOLIC PANEL
ALT: 20 U/L (ref 0–44)
AST: 29 U/L (ref 15–41)
Albumin: 3.5 g/dL (ref 3.5–5.0)
Alkaline Phosphatase: 81 U/L (ref 38–126)
Anion gap: 12 (ref 5–15)
BUN: 17 mg/dL (ref 8–23)
CO2: 23 mmol/L (ref 22–32)
Calcium: 9 mg/dL (ref 8.9–10.3)
Chloride: 102 mmol/L (ref 98–111)
Creatinine, Ser: 0.95 mg/dL (ref 0.44–1.00)
GFR, Estimated: >60 mL/min (ref >60)
Glucose, Bld: 114 mg/dL — ABNORMAL HIGH (ref 70–99)
Potassium: 4.2 mmol/L (ref 3.5–5.1)
Sodium: 137 mmol/L (ref 135–145)
Total Bilirubin: 0.5 mg/dL (ref 0.3–1.2)
Total Protein: 6.6 g/dL (ref 6.5–8.1)

## 2021-02-12 LAB — CBC
HCT: 40.4 % (ref 36.0–46.0)
Hemoglobin: 13.2 g/dL (ref 12.0–15.0)
MCH: 29.5 pg (ref 26.0–34.0)
MCHC: 32.7 g/dL (ref 30.0–36.0)
MCV: 90.4 fL (ref 80.0–100.0)
Platelets: 237 10*3/uL (ref 150–400)
RBC: 4.47 MIL/uL (ref 3.87–5.11)
RDW: 12.6 % (ref 11.5–15.5)
WBC: 11.9 10*3/uL — ABNORMAL HIGH (ref 4.0–10.5)
nRBC: 0 % (ref 0.0–0.2)

## 2021-02-12 LAB — URINALYSIS, ROUTINE W REFLEX MICROSCOPIC
Bilirubin Urine: NEGATIVE
Glucose, UA: NEGATIVE mg/dL
Hgb urine dipstick: NEGATIVE
Ketones, ur: NEGATIVE mg/dL
Leukocytes,Ua: NEGATIVE
Nitrite: NEGATIVE
Protein, ur: NEGATIVE mg/dL
Specific Gravity, Urine: 1.015 (ref 1.005–1.030)
pH: 5 (ref 5.0–8.0)

## 2021-02-12 LAB — TYPE AND SCREEN
ABO/RH(D): O POS
Antibody Screen: NEGATIVE

## 2021-02-12 LAB — BLOOD GAS, ARTERIAL
Acid-Base Excess: 0.2 mmol/L (ref 0.0–2.0)
Bicarbonate: 24.6 mmol/L (ref 20.0–28.0)
Drawn by: 602861
FIO2: 21
O2 Saturation: 95.9 %
Patient temperature: 37
pCO2 arterial: 41.7 mmHg (ref 32.0–48.0)
pH, Arterial: 7.389 (ref 7.350–7.450)
pO2, Arterial: 81.5 mmHg — ABNORMAL LOW (ref 83.0–108.0)

## 2021-02-12 LAB — SURGICAL PCR SCREEN
MRSA, PCR: NEGATIVE
Staphylococcus aureus: POSITIVE — AB

## 2021-02-12 LAB — APTT: aPTT: 24 seconds (ref 24–36)

## 2021-02-12 NOTE — Progress Notes (Signed)
Covid test sample lost by microbiology. Patient called and appt made for drive thru covid testing oni 02/13/21. Informed patient of time and address.

## 2021-02-12 NOTE — Progress Notes (Signed)
Surgical Instructions    Your procedure is scheduled on Friday, July 22nd, 2022.  Report to [facility] Main Entrance "A" at 07:50 A.M., then check in with the Admitting office.  Call this number if you have problems the morning of surgery:  [phone]   If you have any questions prior to your surgery date call [phone]: Open Monday-Friday 8am-4pm    Remember:  Do not eat or drink after midnight the night before your surgery    Take these medicines the morning of surgery with A SIP OF WATER:  amLODipine (NORVASC) FLUoxetine (PROZAC) gabapentin (NEURONTIN)  ipratropium (ATROVENT)  loratadine (CLARITIN)  metoprolol tartrate (LOPRESSOR) omeprazole (PRILOSEC)  If needed:  acetaminophen (TYLENOL)  albuterol (PROVENTIL HFA) - please, bring the inhaler with you albuterol (PROVENTIL)  clonazePAM (KLONOPIN)  olopatadine (PATANOL)  tiZANidine (ZANAFLEX) TRELEGY ELLIPTA   Follow your surgeon's instructions on when to stop Aspirin.  If no instructions were given by your surgeon then you will need to call the office to get those instructions.     As of today, STOP taking any  (unless otherwise instructed by your surgeon) Aleve, Naproxen, Ibuprofen, Motrin, Advil, Goody's, BC's, all herbal medications, fish oil, and all vitamins.          Do not wear jewelry or makeup Do not wear lotions, powders, perfumes, or deodorant. Do not shave 48 hours prior to surgery.   Do not bring valuables to the hospital. DO Not wear nail polish, gel polish, artificial nails, or any other type of covering on natural nails including finger and toenails. If patients have artificial nails, gel coating, etc. that need to be removed by a nail salon please have this removed prior to surgery or surgery may need to be canceled/delayed if the surgeon/ anesthesia feels like the patient is unable to be adequately monitored.             [facility] is not responsible for any belongings or valuables.  Do NOT  Smoke (Tobacco/Vaping) or drink Alcohol 24 hours prior to your procedure If you use a CPAP at night, you may bring all equipment for your overnight stay.   Contacts, glasses, dentures or bridgework may not be worn into surgery, please bring cases for these belongings   For patients admitted to the hospital, discharge time will be determined by your treatment team.   Patients discharged the day of surgery will not be allowed to drive home, and someone needs to stay with them for 24 hours.  ONLY 1 SUPPORT PERSON MAY BE PRESENT WHILE YOU ARE IN SURGERY. IF YOU ARE TO BE ADMITTED ONCE YOU ARE IN YOUR ROOM YOU WILL BE ALLOWED TWO (2) VISITORS.  Minor children may have two parents present. Special consideration for safety and communication needs will be reviewed on a case by case basis.  Special instructions:    Oral Hygiene is also important to reduce your risk of infection.  Remember - BRUSH YOUR TEETH THE MORNING OF SURGERY WITH YOUR REGULAR TOOTHPASTE   [facility]- Preparing For Surgery  Before surgery, you can play an important role. Because skin is not sterile, your skin needs to be as free of germs as possible. You can reduce the number of germs on your skin by washing with CHG (chlorahexidine gluconate) Soap before surgery.  CHG is an antiseptic cleaner which kills germs and bonds with the skin to continue killing germs even after washing.     Please do not use if you have an  allergy to CHG or antibacterial soaps. If your skin becomes reddened/irritated stop using the CHG.  Do not shave (including legs and underarms) for at least 48 hours prior to first CHG shower. It is OK to shave your face.  Please follow these instructions carefully.     Shower the NIGHT BEFORE SURGERY and the MORNING OF SURGERY with CHG Soap.   If you chose to wash your hair, wash your hair first as usual with your normal shampoo. After you shampoo, rinse your hair and body thoroughly to remove the shampoo.  Then  [employer] and genitals (private parts) with your normal soap and rinse thoroughly to remove soap.  After that Use CHG Soap as you would any other liquid soap. You can apply CHG directly to the skin and wash gently with a scrungie or a clean washcloth.   Apply the CHG Soap to your body ONLY FROM THE NECK DOWN.  Do not use on open wounds or open sores. Avoid contact with your eyes, ears, mouth and genitals (private parts). Wash Face and genitals (private parts)  with your normal soap.   Wash thoroughly, paying special attention to the area where your surgery will be performed.  Thoroughly rinse your body with warm water from the neck down.  DO NOT shower/wash with your normal soap after using and rinsing off the CHG Soap.  Pat yourself dry with a CLEAN TOWEL.  Wear CLEAN PAJAMAS to bed the night before surgery  Place CLEAN SHEETS on your bed the night before your surgery  DO NOT SLEEP WITH PETS.   Day of Surgery:  Take a shower with CHG soap. Wear Clean/Comfortable clothing the morning of surgery Do not apply any deodorants/lotions.   Remember to brush your teeth WITH YOUR REGULAR TOOTHPASTE.   Please read over the following fact sheets that you were given.

## 2021-02-12 NOTE — Progress Notes (Addendum)
PCP - Merri Brunette Cardiologist - Dr. Elberta Fortis (has a new patient appt with Kemp o neal coming up) Neurosurgeon: Dr. Weldon Inches Pulmonologist: Dr. Melody Comas  PPM/ICD - Loop recorder   Chest x-ray - n/a EKG - 02/26/20 Stress Test - 01/21/15 ECHO - 02/22/20 Cardiac Cath - denies  Sleep Study - yes in EPIC CPAP - ordered but not in yet (on backorder).   No diabetes  ERAS Protcol -no   COVID TEST- 02/12/21 in PAT sample was lost. Patient called and drive thru covid test was scheduled for 02/13/21. Patient given time and address  Follow your surgeon's instructions on when to stop Aspirin.  If no instructions were given by your surgeon then you will need to call the office to get those instructions.      As of today, STOP taking any  (unless otherwise instructed by your surgeon) Aleve, Naproxen, Ibuprofen, Motrin, Advil, Goody's, BC's, all herbal medications, fish oil, and all vitamins.  Anesthesia review: yes, cardiac history. Loop recorder. I didn't see if dr. Lenell Antu required pulmonary clearance?  Patient denies shortness of breath, fever, cough and chest pain at PAT appointment   All instructions explained to the patient, with a verbal understanding of the material. Patient agrees to go over the instructions while at home for a better understanding. Patient also instructed to self quarantine after being tested for COVID-19. The opportunity to ask questions was provided.

## 2021-02-13 ENCOUNTER — Other Ambulatory Visit (HOSPITAL_COMMUNITY)
Admission: RE | Admit: 2021-02-13 | Discharge: 2021-02-13 | Disposition: A | Discharge status: Home / Self Care | Payer: Medicare HMO | Source: Ambulatory Visit | Attending: Vascular Surgery | Admitting: Vascular Surgery

## 2021-02-13 ENCOUNTER — Other Ambulatory Visit (HOSPITAL_COMMUNITY): Payer: Medicare HMO

## 2021-02-13 DIAGNOSIS — Z20822 Contact with and (suspected) exposure to covid-19: Secondary | ICD-10-CM | POA: Insufficient documentation

## 2021-02-13 DIAGNOSIS — Z01812 Encounter for preprocedural laboratory examination: Secondary | ICD-10-CM | POA: Insufficient documentation

## 2021-02-13 LAB — SARS CORONAVIRUS 2 (TAT 6-24 HRS): SARS Coronavirus 2: NEGATIVE

## 2021-02-13 NOTE — Anesthesia Preprocedure Evaluation (Addendum)
Anesthesia Evaluation  Patient identified by MRN, date of birth, ID band Patient awake    Reviewed: Allergy & Precautions, NPO status , Patient's Chart, lab work & pertinent test results  Airway Mallampati: II  TM Distance: >3 FB Neck ROM: Full    Dental  (+) Missing   Pulmonary asthma , sleep apnea , COPD,  COPD inhaler, former smoker,    Pulmonary exam normal breath sounds clear to auscultation       Cardiovascular hypertension, Pt. on medications and Pt. on home beta blockers + CAD and + Peripheral Vascular Disease  Normal cardiovascular exam Rhythm:Regular Rate:Normal     Neuro/Psych  Headaches, PSYCHIATRIC DISORDERS Anxiety TIA Neuromuscular disease CVA    GI/Hepatic Neg liver ROS, GERD  Medicated and Controlled,  Endo/Other  negative endocrine ROS  Renal/GU negative Renal ROS     Musculoskeletal  (+) Fibromyalgia -  Abdominal (+) + obese,   Peds  Hematology HLD   Anesthesia Other Findings PAD  Reproductive/Obstetrics                           Anesthesia Physical Anesthesia Plan  ASA: 3  Anesthesia Plan: General   Post-op Pain Management:    Induction: Intravenous  PONV Risk Score and Plan: 3 and Ondansetron, Dexamethasone, Midazolam and Treatment may vary due to age or medical condition  Airway Management Planned: Oral ETT  Additional Equipment: Arterial line  Intra-op Plan:   Post-operative Plan: Extubation in OR  Informed Consent: I have reviewed the patients History and Physical, chart, labs and discussed the procedure including the risks, benefits and alternatives for the proposed anesthesia with the patient or authorized representative who has indicated his/her understanding and acceptance.     Dental advisory given  Plan Discussed with: CRNA  Anesthesia Plan Comments: (PAT note by Antionette Poles, PA-C: Recently former smoker (quit April 2022), follows with  pulmonology for hx of COPD and chronic DOE, maintained on trelegy and prn albuterol.  History of carotid stenosis status post R CEA 2010,who presented on 7/28/2021with blurred vision, L sided numbness, tingling. Reported palpitations. Stroke work-up revealed bilateral MCA and MCA/PCA scattered punctate acute infarcts, embolic secondary to unknown source. Prior history of stroke/TIA in 04/2015 and 06/2015. CTA head &neck no LVO. L ICA origin 50% stenosis. Carotid doppler 6/9/2021L 40-59% stenosis. LE DopplerNeg DVT. 2D EchoEF 60-65%. Loop recorder placed to assess for atrial fibrillation as possible etiology. Loop recorder has not show any afib thus far.   Diagnosed with OSA, awaiting delivery of CPAP.  Preop labs reviewed, unremarkable.   EKG 02/21/2020: NSR with sinus arrhythmia.  Rate 74.  Septal infarct, age undetermined.  CT lung cancer screening 12/04/2020: IMPRESSION: 1. Lung-RADS 3, probably benign findings. Short-term follow-up in 6 months is recommended with repeat low-dose chest CT without contrast (please use the following order, "CT CHEST LCS NODULE FOLLOW-UP W/O CM"). 2. Emphysema (ICD10-J43.9) and Aortic Atherosclerosis (ICD10-170.0)  Carotid duplex 01/08/2021: Summary:  Right Carotid: Velocities in the right ICA are consistent with a 1-39%  stenosis.  Left Carotid: Velocities in the left ICA are consistent with a 40-59%  stenosis.  Vertebrals: Bilateral vertebral arteries demonstrate antegrade flow.  Subclavians: Normal flow hemodynamics were seen in bilateral subclavian arteries.   TTE 02/22/20: 1. Left ventricular ejection fraction, by estimation, is 60 to 65%. The  left ventricle has normal function. The left ventricle has no regional  wall motion abnormalities. There is moderate left ventricular hypertrophy.  Left ventricular diastolic  parameters are indeterminate.  2. Right ventricular systolic function is normal. The right ventricular  size is normal.  Tricuspid regurgitation signal is inadequate for assessing  PA pressure.  3. The mitral valve is grossly normal. No evidence of mitral valve  regurgitation.  4. The aortic valve was not well visualized. Aortic valve regurgitation  is not visualized. Mild to moderate aortic valve sclerosis/calcification  is present, without any evidence of aortic stenosis.  5. The inferior vena cava is normal in size with greater than 50%  respiratory variability, suggesting right atrial pressure of 3 mmHg.  Nuclear stress 01/21/2015: Impression: 1.  Normal clinical response to pharmacologic stress test with no chest pain during procedure. 2.  Normal electrocardiographic response during pharmacologic stress. 3.  Myocardial perfusion imaging was mildly abnormal.  There is a small sized area of mild ischemia in the inferior, and basal inferior wall segments. 5.  Overall left ventricular systolic function was normal with no wall motion abnormalities.  LVEF equal 55%. 5.  Medically stable for proposed surgery.  )      Anesthesia Quick Evaluation

## 2021-02-13 NOTE — Progress Notes (Signed)
Anesthesia Chart Review:  Recently former smoker (quit April 2022), follows with pulmonology for hx of COPD and chronic DOE, maintained on trelegy and prn albuterol.  History of carotid stenosis status post R CEA 2010, who presented on 02/21/2020 with blurred vision, L sided numbness, tingling. Reported palpitations.  Stroke work-up revealed bilateral MCA and MCA/PCA scattered punctate acute infarcts, embolic secondary to unknown source. Prior history of stroke/TIA in 04/2015 and 06/2015. CTA head & neck no LVO. L ICA origin 50% stenosis. Carotid doppler 01/03/2020 L 40-59% stenosis. LE Doppler  Neg DVT. 2D Echo EF 60-65%. Loop recorder placed to assess for atrial fibrillation as possible etiology. Loop recorder has not show any afib thus far.   Diagnosed with OSA, awaiting delivery of CPAP.  Preop labs reviewed, unremarkable.   EKG 02/21/2020: NSR with sinus arrhythmia.  Rate 74.  Septal infarct, age undetermined.  CT lung cancer screening 12/04/2020: IMPRESSION: 1. Lung-RADS 3, probably benign findings. Short-term follow-up in 6 months is recommended with repeat low-dose chest CT without contrast (please use the following order, "CT CHEST LCS NODULE FOLLOW-UP W/O CM"). 2.  Emphysema (ICD10-J43.9) and Aortic Atherosclerosis (ICD10-170.0)  Carotid duplex 01/08/2021: Summary:  Right Carotid: Velocities in the right ICA are consistent with a 1-39%  stenosis.  Left Carotid: Velocities in the left ICA are consistent with a 40-59%  stenosis.  Vertebrals:  Bilateral vertebral arteries demonstrate antegrade flow.  Subclavians: Normal flow hemodynamics were seen in bilateral subclavian arteries.   TTE 02/22/20:  1. Left ventricular ejection fraction, by estimation, is 60 to 65%. The  left ventricle has normal function. The left ventricle has no regional  wall motion abnormalities. There is moderate left ventricular hypertrophy.  Left ventricular diastolic  parameters are indeterminate.   2. Right  ventricular systolic function is normal. The right ventricular  size is normal. Tricuspid regurgitation signal is inadequate for assessing  PA pressure.   3. The mitral valve is grossly normal. No evidence of mitral valve  regurgitation.   4. The aortic valve was not well visualized. Aortic valve regurgitation  is not visualized. Mild to moderate aortic valve sclerosis/calcification  is present, without any evidence of aortic stenosis.   5. The inferior vena cava is normal in size with greater than 50%  respiratory variability, suggesting right atrial pressure of 3 mmHg.  Nuclear stress 01/21/2015: Impression: 1.  Normal clinical response to pharmacologic stress test with no chest pain during procedure. 2.  Normal electrocardiographic response during pharmacologic stress. 3.  Myocardial perfusion imaging was mildly abnormal.  There is a small sized area of mild ischemia in the inferior, and basal inferior wall segments. 5.  Overall left ventricular systolic function was normal with no wall motion abnormalities.  LVEF equal 55%. 5.  Medically stable for proposed surgery.   Zannie Cove [facility] Short Stay Center/Anesthesiology Phone [phone] 02/13/2021 9:56 AM

## 2021-02-14 ENCOUNTER — Inpatient Hospital Stay (HOSPITAL_COMMUNITY): Payer: Medicare HMO | Admitting: Certified Registered Nurse Anesthetist

## 2021-02-14 ENCOUNTER — Inpatient Hospital Stay (HOSPITAL_COMMUNITY)
Admission: RE | Admit: 2021-02-14 | Discharge: 2021-02-16 | DRG: 254 | Disposition: A | Discharge status: Home / Self Care | Payer: Medicare HMO | Attending: Vascular Surgery | Admitting: Vascular Surgery

## 2021-02-14 ENCOUNTER — Encounter (HOSPITAL_COMMUNITY): Payer: Self-pay | Admitting: Vascular Surgery

## 2021-02-14 ENCOUNTER — Other Ambulatory Visit: Payer: Self-pay

## 2021-02-14 ENCOUNTER — Encounter (HOSPITAL_COMMUNITY)
Admission: RE | Disposition: A | Discharge status: Home / Self Care | Payer: Self-pay | Source: Home / Self Care | Attending: Vascular Surgery

## 2021-02-14 ENCOUNTER — Inpatient Hospital Stay (HOSPITAL_COMMUNITY): Payer: Medicare HMO

## 2021-02-14 ENCOUNTER — Inpatient Hospital Stay (HOSPITAL_COMMUNITY): Payer: Medicare HMO | Admitting: Physician Assistant

## 2021-02-14 DIAGNOSIS — I708 Atherosclerosis of other arteries: Secondary | ICD-10-CM | POA: Diagnosis present

## 2021-02-14 DIAGNOSIS — Z882 Allergy status to sulfonamides status: Secondary | ICD-10-CM

## 2021-02-14 DIAGNOSIS — Z79899 Other long term (current) drug therapy: Secondary | ICD-10-CM

## 2021-02-14 DIAGNOSIS — Z888 Allergy status to other drugs, medicaments and biological substances status: Secondary | ICD-10-CM

## 2021-02-14 DIAGNOSIS — J449 Chronic obstructive pulmonary disease, unspecified: Secondary | ICD-10-CM | POA: Diagnosis present

## 2021-02-14 DIAGNOSIS — Z823 Family history of stroke: Secondary | ICD-10-CM | POA: Diagnosis not present

## 2021-02-14 DIAGNOSIS — Z825 Family history of asthma and other chronic lower respiratory diseases: Secondary | ICD-10-CM

## 2021-02-14 DIAGNOSIS — Z981 Arthrodesis status: Secondary | ICD-10-CM | POA: Diagnosis not present

## 2021-02-14 DIAGNOSIS — I70221 Atherosclerosis of native arteries of extremities with rest pain, right leg: Secondary | ICD-10-CM

## 2021-02-14 DIAGNOSIS — Z833 Family history of diabetes mellitus: Secondary | ICD-10-CM

## 2021-02-14 DIAGNOSIS — Z8673 Personal history of transient ischemic attack (TIA), and cerebral infarction without residual deficits: Secondary | ICD-10-CM | POA: Diagnosis not present

## 2021-02-14 DIAGNOSIS — I679 Cerebrovascular disease, unspecified: Secondary | ICD-10-CM | POA: Diagnosis present

## 2021-02-14 DIAGNOSIS — Z7983 Long term (current) use of bisphosphonates: Secondary | ICD-10-CM | POA: Diagnosis not present

## 2021-02-14 DIAGNOSIS — I2510 Atherosclerotic heart disease of native coronary artery without angina pectoris: Secondary | ICD-10-CM | POA: Diagnosis present

## 2021-02-14 DIAGNOSIS — Z83438 Family history of other disorder of lipoprotein metabolism and other lipidemia: Secondary | ICD-10-CM

## 2021-02-14 DIAGNOSIS — R7303 Prediabetes: Secondary | ICD-10-CM | POA: Diagnosis present

## 2021-02-14 DIAGNOSIS — I10 Essential (primary) hypertension: Secondary | ICD-10-CM | POA: Diagnosis present

## 2021-02-14 DIAGNOSIS — Z0000 Encounter for general adult medical examination without abnormal findings: Secondary | ICD-10-CM

## 2021-02-14 DIAGNOSIS — Z820 Family history of epilepsy and other diseases of the nervous system: Secondary | ICD-10-CM

## 2021-02-14 DIAGNOSIS — Z7951 Long term (current) use of inhaled steroids: Secondary | ICD-10-CM | POA: Diagnosis not present

## 2021-02-14 DIAGNOSIS — Z87891 Personal history of nicotine dependence: Secondary | ICD-10-CM

## 2021-02-14 DIAGNOSIS — Z7982 Long term (current) use of aspirin: Secondary | ICD-10-CM | POA: Diagnosis not present

## 2021-02-14 DIAGNOSIS — Z881 Allergy status to other antibiotic agents status: Secondary | ICD-10-CM

## 2021-02-14 DIAGNOSIS — Z20822 Contact with and (suspected) exposure to covid-19: Secondary | ICD-10-CM | POA: Diagnosis present

## 2021-02-14 DIAGNOSIS — Z8249 Family history of ischemic heart disease and other diseases of the circulatory system: Secondary | ICD-10-CM | POA: Diagnosis not present

## 2021-02-14 DIAGNOSIS — I739 Peripheral vascular disease, unspecified: Secondary | ICD-10-CM | POA: Diagnosis present

## 2021-02-14 HISTORY — PX: APPLICATION OF WOUND VAC: SHX5189

## 2021-02-14 HISTORY — PX: ENDARTERECTOMY FEMORAL: SHX5804

## 2021-02-14 HISTORY — PX: INSERTION OF ILIAC STENT: SHX6256

## 2021-02-14 LAB — POCT ACTIVATED CLOTTING TIME
Activated Clotting Time: 300 seconds
Activated Clotting Time: 225 seconds
Activated Clotting Time: 271 seconds
Activated Clotting Time: 126 seconds
Activated Clotting Time: 242 seconds

## 2021-02-14 SURGERY — ENDARTERECTOMY, FEMORAL
Anesthesia: General | Site: Groin | Laterality: Right

## 2021-02-14 MED ORDER — FENTANYL CITRATE (PF) 250 MCG/5ML IJ SOLN
INTRAMUSCULAR | Status: AC
  Filled 2021-02-14: qty 5

## 2021-02-14 MED ORDER — MAGNESIUM SULFATE 2 GM/50ML IV SOLN: 2.0000 g | Freq: Every day | INTRAVENOUS | Status: DC | PRN

## 2021-02-14 MED ORDER — PHENYLEPHRINE 40 MCG/ML (10ML) SYRINGE FOR IV PUSH (FOR BLOOD PRESSURE SUPPORT)
PREFILLED_SYRINGE | INTRAVENOUS | Status: DC | PRN
  Administered 2021-02-14: 80 ug via INTRAVENOUS
  Administered 2021-02-14: 120 ug via INTRAVENOUS
  Administered 2021-02-14: 80 ug via INTRAVENOUS

## 2021-02-14 MED ORDER — PROPOFOL 10 MG/ML IV BOLUS
INTRAVENOUS | Status: AC
  Filled 2021-02-14: qty 20

## 2021-02-14 MED ORDER — GABAPENTIN 300 MG PO CAPS
600.0000 mg | ORAL_CAPSULE | Freq: Every day | ORAL | Status: DC
  Administered 2021-02-14 – 2021-02-15 (×2): 600 mg via ORAL
  Filled 2021-02-14 (×2): qty 2

## 2021-02-14 MED ORDER — ADULT MULTIVITAMIN W/MINERALS CH
1.0000 | ORAL_TABLET | Freq: Every day | ORAL | Status: DC
  Administered 2021-02-15: 1 via ORAL
  Filled 2021-02-14: qty 1

## 2021-02-14 MED ORDER — EPHEDRINE SULFATE-NACL 50-0.9 MG/10ML-% IV SOSY
PREFILLED_SYRINGE | INTRAVENOUS | Status: DC | PRN
  Administered 2021-02-14: 10 mg via INTRAVENOUS

## 2021-02-14 MED ORDER — ATORVASTATIN CALCIUM 40 MG PO TABS
40.0000 mg | ORAL_TABLET | Freq: Every day | ORAL | Status: DC
  Administered 2021-02-14 – 2021-02-15 (×2): 40 mg via ORAL
  Filled 2021-02-14 (×2): qty 1

## 2021-02-14 MED ORDER — AMLODIPINE BESYLATE 10 MG PO TABS
10.0000 mg | ORAL_TABLET | Freq: Every day | ORAL | Status: DC
  Administered 2021-02-15 – 2021-02-16 (×2): 10 mg via ORAL
  Filled 2021-02-14 (×2): qty 1

## 2021-02-14 MED ORDER — LABETALOL HCL 5 MG/ML IV SOLN: 10.0000 mg | INTRAVENOUS | Status: DC | PRN

## 2021-02-14 MED ORDER — IPRATROPIUM BROMIDE 0.06 % NA SOLN
2.0000 | Freq: Two times a day (BID) | NASAL | Status: DC
  Filled 2021-02-14 (×2): qty 30

## 2021-02-14 MED ORDER — ALUM & MAG HYDROXIDE-SIMETH 200-200-20 MG/5ML PO SUSP: 15.0000 mL | ORAL | Status: DC | PRN

## 2021-02-14 MED ORDER — ONDANSETRON HCL 4 MG/2ML IJ SOLN: 4.0000 mg | Freq: Four times a day (QID) | INTRAMUSCULAR | Status: DC | PRN

## 2021-02-14 MED ORDER — PHENOL 1.4 % MT LIQD: 1.0000 | OROMUCOSAL | Status: DC | PRN

## 2021-02-14 MED ORDER — ALBUTEROL SULFATE HFA 108 (90 BASE) MCG/ACT IN AERS: 1.0000 | INHALATION_SPRAY | Freq: Four times a day (QID) | RESPIRATORY_TRACT | Status: DC | PRN

## 2021-02-14 MED ORDER — MIDAZOLAM HCL 2 MG/2ML IJ SOLN
INTRAMUSCULAR | Status: AC
  Filled 2021-02-14: qty 2

## 2021-02-14 MED ORDER — PANTOPRAZOLE SODIUM 40 MG PO TBEC
40.0000 mg | DELAYED_RELEASE_TABLET | Freq: Every day | ORAL | Status: DC
Start: 2021-02-15 — End: 2021-02-16
  Administered 2021-02-15 – 2021-02-16 (×2): 40 mg via ORAL
  Filled 2021-02-14: qty 1

## 2021-02-14 MED ORDER — ALBUTEROL SULFATE (2.5 MG/3ML) 0.083% IN NEBU: 2.5000 mg | INHALATION_SOLUTION | Freq: Four times a day (QID) | RESPIRATORY_TRACT | Status: DC | PRN

## 2021-02-14 MED ORDER — CHLORHEXIDINE GLUCONATE CLOTH 2 % EX PADS: 6.0000 | MEDICATED_PAD | Freq: Once | CUTANEOUS | Status: DC

## 2021-02-14 MED ORDER — CLONAZEPAM 0.5 MG PO TABS: 0.5000 mg | ORAL_TABLET | Freq: Three times a day (TID) | ORAL | Status: DC | PRN

## 2021-02-14 MED ORDER — GLYCOPYRROLATE PF 0.2 MG/ML IJ SOSY
PREFILLED_SYRINGE | INTRAMUSCULAR | Status: AC
  Filled 2021-02-14: qty 1

## 2021-02-14 MED ORDER — MIDAZOLAM HCL 2 MG/2ML IJ SOLN
INTRAMUSCULAR | Status: DC | PRN
  Administered 2021-02-14: 2 mg via INTRAVENOUS

## 2021-02-14 MED ORDER — 0.9 % SODIUM CHLORIDE (POUR BTL) OPTIME
TOPICAL | Status: DC | PRN
  Administered 2021-02-14: 2000 mL

## 2021-02-14 MED ORDER — HEPARIN 6000 UNIT IRRIGATION SOLUTION
Status: AC
  Filled 2021-02-14: qty 500

## 2021-02-14 MED ORDER — ACETAMINOPHEN 650 MG RE SUPP: 325.0000 mg | RECTAL | Status: DC | PRN

## 2021-02-14 MED ORDER — POTASSIUM CHLORIDE CRYS ER 20 MEQ PO TBCR: 20.0000 meq | EXTENDED_RELEASE_TABLET | Freq: Every day | ORAL | Status: DC | PRN

## 2021-02-14 MED ORDER — PROTAMINE SULFATE 10 MG/ML IV SOLN
INTRAVENOUS | Status: DC | PRN
  Administered 2021-02-14: 50 mg via INTRAVENOUS

## 2021-02-14 MED ORDER — ACETAMINOPHEN 325 MG PO TABS: 325.0000 mg | ORAL_TABLET | ORAL | Status: DC | PRN

## 2021-02-14 MED ORDER — FENTANYL CITRATE (PF) 100 MCG/2ML IJ SOLN
INTRAMUSCULAR | Status: AC
  Filled 2021-02-14: qty 2

## 2021-02-14 MED ORDER — ONDANSETRON HCL 4 MG/2ML IJ SOLN: 4.0000 mg | Freq: Once | INTRAMUSCULAR | Status: DC | PRN

## 2021-02-14 MED ORDER — FLUOXETINE HCL 20 MG PO CAPS
60.0000 mg | ORAL_CAPSULE | Freq: Every day | ORAL | Status: DC
  Administered 2021-02-15 – 2021-02-16 (×2): 60 mg via ORAL
  Filled 2021-02-14 (×2): qty 3

## 2021-02-14 MED ORDER — GABAPENTIN 300 MG PO CAPS: 300.0000 mg | ORAL_CAPSULE | ORAL | Status: DC

## 2021-02-14 MED ORDER — ORAL CARE MOUTH RINSE: 15.0000 mL | Freq: Once | OROMUCOSAL | Status: AC

## 2021-02-14 MED ORDER — ONDANSETRON HCL 4 MG/2ML IJ SOLN
INTRAMUSCULAR | Status: DC | PRN
  Administered 2021-02-14: 4 mg via INTRAVENOUS

## 2021-02-14 MED ORDER — HEPARIN 6000 UNIT IRRIGATION SOLUTION
Status: DC | PRN
  Administered 2021-02-14: 1

## 2021-02-14 MED ORDER — HEMOSTATIC AGENTS (NO CHARGE) OPTIME
TOPICAL | Status: DC | PRN
  Administered 2021-02-14 (×2): 1 via TOPICAL

## 2021-02-14 MED ORDER — OXYCODONE-ACETAMINOPHEN 5-325 MG PO TABS
1.0000 | ORAL_TABLET | ORAL | Status: DC | PRN
  Administered 2021-02-15 (×2): 2 via ORAL
  Administered 2021-02-16: 1 via ORAL
  Filled 2021-02-14: qty 2
  Filled 2021-02-14: qty 1
  Filled 2021-02-14: qty 2

## 2021-02-14 MED ORDER — UMECLIDINIUM BROMIDE 62.5 MCG/INH IN AEPB
1.0000 | INHALATION_SPRAY | Freq: Every day | RESPIRATORY_TRACT | Status: DC
  Administered 2021-02-15 – 2021-02-16 (×2): 1 via RESPIRATORY_TRACT
  Filled 2021-02-14: qty 7

## 2021-02-14 MED ORDER — FENTANYL CITRATE (PF) 100 MCG/2ML IJ SOLN
25.0000 ug | INTRAMUSCULAR | Status: DC | PRN
  Administered 2021-02-14 (×4): 25 ug via INTRAVENOUS

## 2021-02-14 MED ORDER — FLUTICASONE-UMECLIDIN-VILANT 100-62.5-25 MCG/INH IN AEPB: 1.0000 | INHALATION_SPRAY | Freq: Every day | RESPIRATORY_TRACT | Status: DC

## 2021-02-14 MED ORDER — CEFAZOLIN SODIUM-DEXTROSE 2-4 GM/100ML-% IV SOLN
2.0000 g | Freq: Three times a day (TID) | INTRAVENOUS | Status: AC
  Administered 2021-02-14 – 2021-02-15 (×2): 2 g via INTRAVENOUS
  Filled 2021-02-14 (×2): qty 100

## 2021-02-14 MED ORDER — DEXAMETHASONE SODIUM PHOSPHATE 10 MG/ML IJ SOLN
INTRAMUSCULAR | Status: DC | PRN
  Administered 2021-02-14: 10 mg via INTRAVENOUS

## 2021-02-14 MED ORDER — ACETAMINOPHEN 10 MG/ML IV SOLN: 1000.0000 mg | Freq: Once | INTRAVENOUS | Status: DC | PRN

## 2021-02-14 MED ORDER — LORATADINE 10 MG PO TABS
10.0000 mg | ORAL_TABLET | Freq: Every day | ORAL | Status: DC
  Administered 2021-02-15 – 2021-02-16 (×2): 10 mg via ORAL
  Filled 2021-02-14 (×2): qty 1

## 2021-02-14 MED ORDER — LACTATED RINGERS IV SOLN: INTRAVENOUS | Status: DC

## 2021-02-14 MED ORDER — PHENYLEPHRINE HCL-NACL 10-0.9 MG/250ML-% IV SOLN
INTRAVENOUS | Status: DC | PRN
  Administered 2021-02-14: 40 ug/min via INTRAVENOUS

## 2021-02-14 MED ORDER — VARENICLINE TARTRATE 1 MG PO TABS
1.0000 mg | ORAL_TABLET | Freq: Two times a day (BID) | ORAL | Status: DC
  Administered 2021-02-14 – 2021-02-16 (×4): 1 mg via ORAL
  Filled 2021-02-14 (×5): qty 1

## 2021-02-14 MED ORDER — HEPARIN SODIUM (PORCINE) 1000 UNIT/ML IJ SOLN
INTRAMUSCULAR | Status: DC | PRN
  Administered 2021-02-14: 3000 [IU] via INTRAVENOUS
  Administered 2021-02-14: 9000 [IU] via INTRAVENOUS

## 2021-02-14 MED ORDER — ONDANSETRON HCL 4 MG/2ML IJ SOLN
INTRAMUSCULAR | Status: AC
  Filled 2021-02-14: qty 2

## 2021-02-14 MED ORDER — IODIXANOL 320 MG/ML IV SOLN
INTRAVENOUS | Status: DC | PRN
  Administered 2021-02-14: 50 mL via INTRA_ARTERIAL

## 2021-02-14 MED ORDER — LISINOPRIL 10 MG PO TABS
30.0000 mg | ORAL_TABLET | Freq: Every day | ORAL | Status: DC
  Administered 2021-02-15 – 2021-02-16 (×2): 30 mg via ORAL
  Filled 2021-02-14 (×2): qty 3

## 2021-02-14 MED ORDER — LIDOCAINE 2% (20 MG/ML) 5 ML SYRINGE
INTRAMUSCULAR | Status: DC | PRN
Start: 2021-02-14 — End: 2021-02-14
  Administered 2021-02-14: 60 mg via INTRAVENOUS

## 2021-02-14 MED ORDER — SODIUM CHLORIDE 0.9 % IV SOLN: INTRAVENOUS | Status: DC

## 2021-02-14 MED ORDER — CLOPIDOGREL BISULFATE 75 MG PO TABS
75.0000 mg | ORAL_TABLET | Freq: Every day | ORAL | Status: DC
  Administered 2021-02-15 – 2021-02-16 (×2): 75 mg via ORAL
  Filled 2021-02-14 (×2): qty 1

## 2021-02-14 MED ORDER — GUAIFENESIN-DM 100-10 MG/5ML PO SYRP: 15.0000 mL | ORAL_SOLUTION | ORAL | Status: DC | PRN

## 2021-02-14 MED ORDER — AMISULPRIDE (ANTIEMETIC) 5 MG/2ML IV SOLN: 10.0000 mg | Freq: Once | INTRAVENOUS | Status: DC | PRN

## 2021-02-14 MED ORDER — METOPROLOL TARTRATE 25 MG PO TABS
25.0000 mg | ORAL_TABLET | Freq: Two times a day (BID) | ORAL | Status: DC
  Administered 2021-02-14: 25 mg via ORAL
  Filled 2021-02-14 (×3): qty 1

## 2021-02-14 MED ORDER — ROCURONIUM BROMIDE 10 MG/ML (PF) SYRINGE
PREFILLED_SYRINGE | INTRAVENOUS | Status: DC | PRN
Start: 2021-02-14 — End: 2021-02-14
  Administered 2021-02-14: 40 mg via INTRAVENOUS
  Administered 2021-02-14 (×2): 30 mg via INTRAVENOUS
  Administered 2021-02-14: 20 mg via INTRAVENOUS

## 2021-02-14 MED ORDER — FENTANYL CITRATE (PF) 250 MCG/5ML IJ SOLN
INTRAMUSCULAR | Status: DC | PRN
  Administered 2021-02-14 (×3): 50 ug via INTRAVENOUS
  Administered 2021-02-14: 150 ug via INTRAVENOUS

## 2021-02-14 MED ORDER — ONDANSETRON HCL 4 MG/2ML IJ SOLN: INTRAMUSCULAR | Status: DC | PRN

## 2021-02-14 MED ORDER — DOCUSATE SODIUM 100 MG PO CAPS
100.0000 mg | ORAL_CAPSULE | Freq: Every day | ORAL | Status: DC
  Administered 2021-02-15 – 2021-02-16 (×2): 100 mg via ORAL
  Filled 2021-02-14 (×2): qty 1

## 2021-02-14 MED ORDER — CEFAZOLIN SODIUM-DEXTROSE 2-4 GM/100ML-% IV SOLN
2.0000 g | INTRAVENOUS | Status: AC
  Administered 2021-02-14: 2 g via INTRAVENOUS
  Filled 2021-02-14: qty 100

## 2021-02-14 MED ORDER — HYDRALAZINE HCL 20 MG/ML IJ SOLN: 5.0000 mg | INTRAMUSCULAR | Status: DC | PRN

## 2021-02-14 MED ORDER — ASPIRIN EC 81 MG PO TBEC
81.0000 mg | DELAYED_RELEASE_TABLET | Freq: Every day | ORAL | Status: DC
  Administered 2021-02-15 – 2021-02-16 (×2): 81 mg via ORAL
  Filled 2021-02-14 (×2): qty 1

## 2021-02-14 MED ORDER — GLYCOPYRROLATE PF 0.2 MG/ML IJ SOSY
PREFILLED_SYRINGE | INTRAMUSCULAR | Status: DC | PRN
  Administered 2021-02-14: .2 mg via INTRAVENOUS

## 2021-02-14 MED ORDER — METOPROLOL TARTRATE 5 MG/5ML IV SOLN: 2.0000 mg | INTRAVENOUS | Status: DC | PRN

## 2021-02-14 MED ORDER — DEXAMETHASONE SODIUM PHOSPHATE 10 MG/ML IJ SOLN
INTRAMUSCULAR | Status: AC
  Filled 2021-02-14: qty 1

## 2021-02-14 MED ORDER — CHLORHEXIDINE GLUCONATE 0.12 % MT SOLN
15.0000 mL | Freq: Once | OROMUCOSAL | Status: AC
  Administered 2021-02-14: 15 mL via OROMUCOSAL
  Filled 2021-02-14: qty 15

## 2021-02-14 MED ORDER — B COMPLEX-C PO TABS
1.0000 | ORAL_TABLET | Freq: Every day | ORAL | Status: DC
  Administered 2021-02-15 – 2021-02-16 (×2): 1 via ORAL
  Filled 2021-02-14 (×2): qty 1

## 2021-02-14 MED ORDER — MORPHINE SULFATE (PF) 2 MG/ML IV SOLN
2.0000 mg | INTRAVENOUS | Status: DC | PRN
Start: 2021-02-14 — End: 2021-02-16
  Administered 2021-02-14 – 2021-02-15 (×2): 2 mg via INTRAVENOUS
  Filled 2021-02-14 (×2): qty 1

## 2021-02-14 MED ORDER — SODIUM CHLORIDE 0.9 % IV SOLN: 500.0000 mL | Freq: Once | INTRAVENOUS | Status: DC | PRN

## 2021-02-14 MED ORDER — GABAPENTIN 300 MG PO CAPS
300.0000 mg | ORAL_CAPSULE | Freq: Two times a day (BID) | ORAL | Status: DC
  Administered 2021-02-15 – 2021-02-16 (×4): 300 mg via ORAL
  Filled 2021-02-14 (×4): qty 1

## 2021-02-14 MED ORDER — PROPOFOL 10 MG/ML IV BOLUS
INTRAVENOUS | Status: DC | PRN
  Administered 2021-02-14: 150 mg via INTRAVENOUS

## 2021-02-14 MED ORDER — FLUTICASONE FUROATE-VILANTEROL 100-25 MCG/INH IN AEPB
1.0000 | INHALATION_SPRAY | Freq: Every day | RESPIRATORY_TRACT | Status: DC
  Administered 2021-02-15 – 2021-02-16 (×2): 1 via RESPIRATORY_TRACT
  Filled 2021-02-14: qty 28

## 2021-02-14 SURGICAL SUPPLY — 70 items
ADH SKN CLS APL DERMABOND .7 (GAUZE/BANDAGES/DRESSINGS) ×1
APL PRP STRL LF DISP 70% ISPRP (MISCELLANEOUS) ×1
APL SKNCLS STERI-STRIP NONHPOA (GAUZE/BANDAGES/DRESSINGS) ×1
BAG COUNTER SPONGE SURGICOUNT (BAG) ×2 IMPLANT
BAG SPNG CNTER NS LX DISP (BAG) ×1
BALLN MUSTANG 5.0X40 75 (BALLOONS) ×2
BALLOON MUSTANG 5.0X40 75 (BALLOONS) IMPLANT
BENZOIN TINCTURE PRP APPL 2/3 (GAUZE/BANDAGES/DRESSINGS) ×2 IMPLANT
CANISTER SUCT 3000ML PPV (MISCELLANEOUS) ×2 IMPLANT
CANNULA VESSEL 3MM 2 BLNT TIP (CANNULA) ×4 IMPLANT
CATH BEACON 5 .035 65 KMP TIP (CATHETERS) IMPLANT
CATH OMNI FLUSH 5F 65CM (CATHETERS) ×3 IMPLANT
CHLORAPREP W/TINT 26 (MISCELLANEOUS) ×2 IMPLANT
CLIP VESOCCLUDE MED 24/CT (CLIP) ×3 IMPLANT
CLIP VESOCCLUDE SM WIDE 24/CT (CLIP) ×2 IMPLANT
COVER DOME SNAP 22 D (MISCELLANEOUS) ×2 IMPLANT
DERMABOND ADVANCED (GAUZE/BANDAGES/DRESSINGS) ×1
DERMABOND ADVANCED .7 DNX12 (GAUZE/BANDAGES/DRESSINGS) ×1 IMPLANT
DRESSING PEEL AND PLC PRVNA 13 (GAUZE/BANDAGES/DRESSINGS) IMPLANT
DRSG PEEL AND PLACE PREVENA 13 (GAUZE/BANDAGES/DRESSINGS) ×2
DRYSEAL FLEXSHEATH 14FR 33CM (SHEATH) ×1
ELECT REM PT RETURN 9FT ADLT (ELECTROSURGICAL) ×2
ELECTRODE REM PT RTRN 9FT ADLT (ELECTROSURGICAL) ×1 IMPLANT
EVACUATOR SILICONE 100CC (DRAIN) IMPLANT
GAUZE SPONGE 4X4 12PLY STRL (GAUZE/BANDAGES/DRESSINGS) ×2 IMPLANT
GLIDEWIRE ADV .035X180CM (WIRE) ×1 IMPLANT
GLOVE SURG POLYISO LF SZ8 (GLOVE) ×2 IMPLANT
GOWN STRL REUS W/ TWL LRG LVL3 (GOWN DISPOSABLE) ×2 IMPLANT
GOWN STRL REUS W/ TWL XL LVL3 (GOWN DISPOSABLE) ×1 IMPLANT
GOWN STRL REUS W/TWL LRG LVL3 (GOWN DISPOSABLE) ×4
GOWN STRL REUS W/TWL XL LVL3 (GOWN DISPOSABLE) ×2
HEMOSTAT SNOW SURGICEL 2X4 (HEMOSTASIS) ×2 IMPLANT
KIT BASIN OR (CUSTOM PROCEDURE TRAY) ×2 IMPLANT
KIT DRSG PREVENA PLUS 7DAY 125 (MISCELLANEOUS) ×1 IMPLANT
KIT ENCORE 26 ADVANTAGE (KITS) ×1 IMPLANT
KIT MICROPUNCTURE NIT STIFF (SHEATH) ×2 IMPLANT
KIT TURNOVER KIT B (KITS) ×2 IMPLANT
NS IRRIG 1000ML POUR BTL (IV SOLUTION) ×4 IMPLANT
PACK ENDO MINOR (CUSTOM PROCEDURE TRAY) ×1 IMPLANT
PACK PERIPHERAL VASCULAR (CUSTOM PROCEDURE TRAY) ×2 IMPLANT
PAD ARMBOARD 7.5X6 YLW CONV (MISCELLANEOUS) ×4 IMPLANT
PENCIL SMOKE EVACUATOR (MISCELLANEOUS) ×1 IMPLANT
SHEATH BRITE TIP 7FR 35CM (SHEATH) ×2 IMPLANT
SHEATH BRITE TIP 7FRX11 (SHEATH) ×2 IMPLANT
SHEATH DRYSEAL FLEX 14FR 33CM (SHEATH) IMPLANT
SHEATH FAST CATH 10F 12CM (SHEATH) ×1 IMPLANT
SHEATH PINNACLE 5F 10CM (SHEATH) ×1 IMPLANT
SHEATH PINNACLE 8F 10CM (SHEATH) ×1 IMPLANT
STENT VIABAHN 8X29X80 VBX (Permanent Stent) IMPLANT
STENT VIABAHN 8X39X80 VBX (Permanent Stent) ×1 IMPLANT
STOPCOCK MORSE 400PSI 3WAY (MISCELLANEOUS) ×2 IMPLANT
STRIP CLOSURE SKIN 1/2X4 (GAUZE/BANDAGES/DRESSINGS) ×1 IMPLANT
SUT ETHILON 3 0 PS 1 (SUTURE) IMPLANT
SUT MNCRL AB 4-0 PS2 18 (SUTURE) ×2 IMPLANT
SUT PROLENE 5 0 C 1 24 (SUTURE) ×12 IMPLANT
SUT PROLENE 6 0 BV (SUTURE) ×4 IMPLANT
SUT VIC AB 2-0 CT1 27 (SUTURE) ×6
SUT VIC AB 2-0 CT1 TAPERPNT 27 (SUTURE) ×1 IMPLANT
SUT VIC AB 3-0 SH 27 (SUTURE) ×2
SUT VIC AB 3-0 SH 27X BRD (SUTURE) ×1 IMPLANT
SYR 10ML LL (SYRINGE) ×2 IMPLANT
SYR MEDRAD MARK V 150ML (SYRINGE) ×2 IMPLANT
TOWEL GREEN STERILE (TOWEL DISPOSABLE) ×4 IMPLANT
TOWEL GREEN STERILE FF (TOWEL DISPOSABLE) ×2 IMPLANT
TUBING HIGH PRESSURE 120CM (CONNECTOR) ×2 IMPLANT
UNDERPAD 30X36 HEAVY ABSORB (UNDERPADS AND DIAPERS) ×2 IMPLANT
WATER STERILE IRR 1000ML POUR (IV SOLUTION) ×2 IMPLANT
WIRE AMPLATZ SS-J .035X180CM (WIRE) IMPLANT
WIRE AMPLATZ SS-J .035X260CM (WIRE) IMPLANT
WIRE BENTSON .035X145CM (WIRE) ×2 IMPLANT

## 2021-02-14 NOTE — Interval H&P Note (Signed)
History and Physical Interval Note:  02/14/2021 9:46 AM  Norma Scott  has presented today for surgery, with the diagnosis of PAD.  The various methods of treatment have been discussed with the patient and family. After consideration of risks, benefits and other options for treatment, the patient has consented to  Procedure(s): RIGHT FEMORAL ENDARTERECTOMY (Right) INSERTION OF RIGHT ILIAC ARTERY STENT (Right) as a surgical intervention.  The patient's history has been reviewed, patient examined, no change in status, stable for surgery.  I have reviewed the patient's chart and labs.  Questions were answered to the patient's satisfaction.     Leonie Douglas

## 2021-02-14 NOTE — Anesthesia Postprocedure Evaluation (Signed)
Anesthesia Post Note  Patient: Norma Scott  Procedure(s) Performed: RIGHT FEMORAL EXTENDED ENDARTERECTOMY WITH VEIN PATCH ANGIOPLASTY USING GREATER SAPHENOUS VEIN HARVEST AND PROFUNDOPLASTY (Right: Groin) INSERTION OF RIGHT ILIAC ARTERY STENT (Right: Groin) APPLICATION OF WOUND VAC (Right: Groin)     Patient location during evaluation: PACU Anesthesia Type: General Level of consciousness: awake Pain management: pain level controlled Vital Signs Assessment: post-procedure vital signs reviewed and stable Respiratory status: spontaneous breathing, nonlabored ventilation, respiratory function stable and patient connected to nasal cannula oxygen Cardiovascular status: blood pressure returned to baseline and stable Postop Assessment: no apparent nausea or vomiting Anesthetic complications: no   No notable events documented.  Last Vitals:  Vitals:   02/14/21 1800 02/14/21 1837  BP:  (!) 133/102  Pulse: (!) 58 (!) 56  Resp: 13 16  Temp: (!) 36.2 C 36.8 C  SpO2: 98% 98%    Last Pain:  Vitals:   02/14/21 1837  TempSrc: Oral  PainSc:                  Catheryn Bacon Sarha Bartelt

## 2021-02-14 NOTE — Transfer of Care (Signed)
Immediate Anesthesia Transfer of Care Note  Patient: Norma Scott  Procedure(s) Performed: RIGHT FEMORAL EXTENDED ENDARTERECTOMY WITH VEIN PATCH ANGIOPLASTY USING GREATER SAPHENOUS VEIN HARVEST AND PROFUNDOPLASTY (Right: Groin) INSERTION OF RIGHT ILIAC ARTERY STENT (Right: Groin) APPLICATION OF WOUND VAC (Right: Groin)  Patient Location: PACU  Anesthesia Type:General  Level of Consciousness: awake and patient cooperative  Airway & Oxygen Therapy: Patient Spontanous Breathing  Post-op Assessment: Report given to RN and Post -op Vital signs reviewed and stable  Post vital signs: Reviewed and stable  Last Vitals:  Vitals Value Taken Time  BP    Temp    Pulse    Resp    SpO2      Last Pain:  Vitals:   02/14/21 0833  TempSrc:   PainSc: 0-No pain         Complications: No notable events documented.

## 2021-02-14 NOTE — Discharge Instructions (Signed)
Leave the sponge dressing in place on your groin until seen in the office for follow-up.  If the battery dies you may use the charger that is with the pump or you may remove the dressing sooner.  If the sponge loses the suction or the pump quits working, you may remove the sponge.  Your incision is closed with suture that will eventually dissolve slowly over time.     [facility]  Discharge Instructions  Lower Extremity Angiogram; Angioplasty/Stenting  Please refer to the following instructions for your post-procedure care. Your surgeon or physician assistant will discuss any changes with you.  Activity  Avoid lifting more than 8 pounds (1 gallons of milk) for 72 hours (3 days) after your procedure. You may walk as much as you can tolerate. It's OK to drive after 72 hours.  Bathing/Showering  You may shower the day after your procedure. If you have a bandage, you may remove it at 24- 48 hours. Clean your incision site with mild soap and water. Pat the area dry with a clean towel.  Diet  Resume your pre-procedure diet. There are no special food restrictions following this procedure. All patients with peripheral vascular disease should follow a low fat/low cholesterol diet. In order to heal from your surgery, it is CRITICAL to get adequate nutrition. Your body requires vitamins, minerals, and protein. Vegetables are the best source of vitamins and minerals. Vegetables also provide the perfect balance of protein. Processed food has little nutritional value, so try to avoid this.  Medications  Resume taking all of your medications unless your doctor tells you not to. If your incision is causing pain, you may take over-the-counter pain relievers such as acetaminophen (Tylenol)  Follow Up  Follow up will be arranged at the time of your procedure. You may have an office visit scheduled or may be scheduled for surgery. Ask your surgeon if you have any  questions.  Please call us immediately for any of the following conditions: Severe or worsening pain your legs or feet at rest or with walking. Increased pain, redness, drainage at your groin puncture site. Fever of 101 degrees or higher. If you have any mild or slow bleeding from your puncture site: lie down, apply firm constant pressure over the area with a piece of gauze or a clean wash cloth for 30 minutes- no peeking!, call 911 right away if you are still bleeding after 30 minutes, or if the bleeding is heavy and unmanageable.  Reduce your risk factors of vascular disease:  Stop smoking. If you would like help call [facility] at 1-800-QUIT-NOW (602-623-1719) or [place] at (714) 593-1104. Manage your cholesterol Maintain a desired weight Control your diabetes Keep your blood pressure down  If you have any questions, please call the office at [phone]

## 2021-02-14 NOTE — Anesthesia Procedure Notes (Signed)
Procedure Name: Intubation Date/Time: 02/14/2021 10:06 AM Performed by: Donnelly Angelica, RN Pre-anesthesia Checklist: Patient identified, Emergency Drugs available, Suction available and Patient being monitored Patient Re-evaluated:Patient Re-evaluated prior to induction Oxygen Delivery Method: Circle System Utilized Preoxygenation: Pre-oxygenation with 100% oxygen Induction Type: IV induction Ventilation: Mask ventilation without difficulty Laryngoscope Size: Mac and 3 Grade View: Grade I Tube type: Oral Tube size: 7.0 mm Number of attempts: 1 Airway Equipment and Method: Stylet and Oral airway Placement Confirmation: ETT inserted through vocal cords under direct vision, positive ETCO2 and breath sounds checked- equal and bilateral Secured at: 21 cm Tube secured with: Tape Dental Injury: Teeth and Oropharynx as per pre-operative assessment

## 2021-02-14 NOTE — Progress Notes (Signed)
Pt arrived to unit from PACU VSS, A/O x 4,  CCMD called ,CHG given, Wound vac on Right groin, pt oriented to unit,Will continue to monitor.   Karna Christmas Jason Frisbee, RN    02/14/21 1837  Vitals  Temp 98.2 F (36.8 C)  Temp Source Oral  BP (!) 133/102  MAP (mmHg) 113  BP Location Left Arm  BP Method Automatic  Patient Position (if appropriate) Lying  Pulse Rate (!) 56  Pulse Rate Source Monitor  ECG Heart Rate 60  Resp 16  Level of Consciousness  Level of Consciousness Alert  Oxygen Therapy  SpO2 98 %  O2 Device Room Air  Pain Assessment  Pain Scale 0-10  Pain Score 5  Height and Weight  Height 4\' 11"  (1.499 m)  Weight 91.8 kg  Type of Scale Used Bed  Type of Weight Stated  BSA (Calculated - sq m) 1.95 sq meters  BMI (Calculated) 40.85  Weight in (lb) to have BMI = 25 123.5  MEWS Score  MEWS Temp 0  MEWS Systolic 0  MEWS Pulse 0  MEWS RR 0  MEWS LOC 0  MEWS Score 0  MEWS Score Color Green

## 2021-02-14 NOTE — Op Note (Signed)
DATE OF SERVICE: 02/14/2021  PATIENT:  Norma Scott  70 y.o. 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  ANESTHESIA:   general  EBL:  BLOOD ADMINISTERED:none  DRAINS: none   LOCAL MEDICATIONS USED:  NONE  SPECIMEN:  none  COUNTS: confirmed correct.  TOURNIQUET:  none  PATIENT DISPOSITION:  PACU - hemodynamically stable.   Delay start of Pharmacological VTE agent (>24hrs) due to surgical blood loss or risk of bleeding: no  INDICATION FOR PROCEDURE: Norma Scott is a 70 y.o. 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  I preoperative angiogram revealed common iliac artery stenosis and severe exophytic common femoral plaque. After careful discussion of risks, benefits, and alternatives the patient was offered endarterectomy and retrograde stenting. The patient understood and wished to proceed.  OPERATIVE FINDINGS: Successful extended endarterectomy onto the profunda femoris.  Greater saphenous vein harvested and cut into spiral graft for patch angioplasty.  Retrograde stenting complicated by stent malfunction trying to pass this through the severe common iliac disease.  I was able to salvage this by carefully removing the stent and advancing in 14 French dry seal sheath into the injury to the patch.  Remainder of case uneventful.  Good Doppler flow in the SFA and profunda at completion.  DESCRIPTION OF PROCEDURE: After  identification of the patient in the pre-operative holding area, the patient was transferred to the operating room. The patient was positioned supine on the operating room table. Anesthesia was induced. The right groin was prepped and draped in standard fashion. A surgical pause was performed confirming correct patient, procedure, and operative location.  Intraoperative ultrasound was used to map the course of the common femoral artery and its bifurcation.  An oblique incision was planned on her groin.  This was carried down through subcutaneous tissue until the femoral sheath was encountered and divided longitudinally.  The confluence of saphenous veins was identified and traced to the great saphenous vein.  A 5 cm length of greater saphenous vein was skeletonized.  I clamped the vein at the fossa ovalis and distally and excised the vein, passing it off the table to do well into a heparinized saline solution.  The 2 areas of cut vein were oversewn with 5-0 Prolene suture.  The femoral vessels were then skeletonized.  Exposure was carried about 2 cm down onto the main profunda branch, 2 cm distal in the superficial femoral artery, and external iliac artery under the inguinal ligament.  Patient was systemically heparinized.  Activated clotting time measurements were used throughout the case to confirm adequate anticoagulation.  Clamps were applied to the femoral vessels and external iliac artery.  An 11 blade was used to make an anterior arteriotomy on the common femoral artery.  This was extended with Potts scissors.  Severe plaque was identified.  This was endarterectomized using standard technique using a Astronomer.  The proximal endpoint feathered nicely.  The distal endpoint carried into the profunda femoris artery.  I extended the arteriotomy  onto the profunda and achieved an excellent endpoint here.  The greater saphenous vein was cut into a spiral graft to allow patch angioplasty of the  femoral endarterectomy.  This was sewn in running continuous fashion using 5-0 Prolene suture.  Medially prior to completion the patch was flushed and de-aired.  Several areas of leak were identified and repaired with 5-0 Prolene.  Micropuncture technique was used to access the patch.  A Glidewire advantage was then navigated into the aorta.  Over the wire a 7 French sheath was introduced into the external iliac artery.  Retrograde and antegrade angiography was performed confirming severe common iliac artery stenosis.  I tried to advance an 8 x 39 mm VBX stent across the lesion but this would not track.  This deformed the stent and bent it back on itself making retrieval of the stent difficult.  I had to remove all of our endovascular equipment.  This tore a small hole in the patch angioplasty.  I introduced a 14 French dry seal over the wire to seal the hole which was effective.  We then predilated the lesion with a 5 x 40 mm Mustang balloon.  This allowed easy tracking of an 8 x 39 mm VBX stent across the lesion.  This was deployed in standard fashion.  Completion angiography revealed excellent result.  The dry seal sheath was carefully removed and clamps reapplied to the common femoral artery.  The arteriotomy in the patch was repaired using continuous running suture of 5-0 Prolene.  This was hemostatic.  Heparin was reversed with protamine.  Hemostasis was achieved in the patch.  Hemostasis was achieved in the surgical bed.  The wound was closed in layers using 2-0 Vicryl, 3-0 Vicryl, 4-0 Monocryl.  A Prevena VAC was applied to the groin.  Upon completion of the case instrument and sharps counts were confirmed correct. The patient was transferred to the PACU in good condition. I was present for all portions of the procedure.  Rande Brunt. Lenell Antu, MD [facility] Phone Number: [phone] 02/14/2021 2:19 PM

## 2021-02-14 NOTE — Progress Notes (Signed)
Pt's belongings delivered to pt's son, Jorja Loa. Clothes, cell phone, glasses, wallet and purse.   Viviano Simas, RN

## 2021-02-14 NOTE — Anesthesia Procedure Notes (Signed)
Arterial Line Insertion Start/End7/22/2022 9:40 AM Performed by: Drema Pry, CRNA, CRNA  Preanesthetic checklist: patient identified, IV checked, risks and benefits discussed, surgical consent, monitors and equipment checked and pre-op evaluation Lidocaine 1% used for infiltration Right, radial was placed Catheter size: 20 G Hand hygiene performed  and maximum sterile barriers used   Attempts: 2 Procedure performed without using ultrasound guided technique. Following insertion, dressing applied and Biopatch. Post procedure assessment: normal  Patient tolerated the procedure well with no immediate complications.

## 2021-02-15 ENCOUNTER — Encounter (HOSPITAL_COMMUNITY): Payer: Medicare HMO

## 2021-02-15 LAB — CBC
HCT: 29.8 % — ABNORMAL LOW (ref 36.0–46.0)
Hemoglobin: 10.1 g/dL — ABNORMAL LOW (ref 12.0–15.0)
MCH: 30.2 pg (ref 26.0–34.0)
MCHC: 33.9 g/dL (ref 30.0–36.0)
MCV: 89.2 fL (ref 80.0–100.0)
Platelets: 235 10*3/uL (ref 150–400)
RBC: 3.34 MIL/uL — ABNORMAL LOW (ref 3.87–5.11)
RDW: 12.9 % (ref 11.5–15.5)
WBC: 17.8 10*3/uL — ABNORMAL HIGH (ref 4.0–10.5)
nRBC: 0 % (ref 0.0–0.2)

## 2021-02-15 LAB — BASIC METABOLIC PANEL
Anion gap: 7 (ref 5–15)
BUN: 15 mg/dL (ref 8–23)
CO2: 24 mmol/L (ref 22–32)
Calcium: 8.2 mg/dL — ABNORMAL LOW (ref 8.9–10.3)
Chloride: 101 mmol/L (ref 98–111)
Creatinine, Ser: 1 mg/dL (ref 0.44–1.00)
GFR, Estimated: >60 mL/min (ref >60)
Glucose, Bld: 169 mg/dL — ABNORMAL HIGH (ref 70–99)
Potassium: 5.8 mmol/L — ABNORMAL HIGH (ref 3.5–5.1)
Sodium: 132 mmol/L — ABNORMAL LOW (ref 135–145)

## 2021-02-15 LAB — LIPID PANEL
Cholesterol: 107 mg/dL (ref 0–200)
HDL: 38 mg/dL — ABNORMAL LOW (ref >40)
LDL Cholesterol: 61 mg/dL (ref 0–99)
Total CHOL/HDL Ratio: 2.8 RATIO
Triglycerides: 39 mg/dL (ref <150)
VLDL: 8 mg/dL (ref 0–40)

## 2021-02-15 MED ORDER — OXYCODONE-ACETAMINOPHEN 5-325 MG PO TABS: 1.0000 | ORAL_TABLET | Freq: Four times a day (QID) | ORAL | 0 refills | Status: DC | PRN

## 2021-02-15 NOTE — Progress Notes (Signed)
Mobility Specialist: Progress Note   02/15/21 1614  Mobility  Activity Ambulated in hall  Level of Assistance Modified independent, requires aide device or extra time  Assistive Device Front wheel walker  Mobility Ambulated with assistance in hallway  Mobility Response Tolerated well   Pt seen ambulating in the hallway independently with RW. Pt asx. Will f/u as able.   [facility] Kaedance Magos Mobility Specialist Mobility Specialist Phone: [phone]

## 2021-02-15 NOTE — Evaluation (Signed)
Physical Therapy Evaluation/ Discharge Patient Details Name: Norma Scott MRN: [id number] DOB: 18-Feb-1951 Today's Date: 02/15/2021   History of Present Illness  70 yo admitted 7/22 for Rt fem endarterectomy with angioplasty due to PAD with claudication. PMHx: CAD, fibromyalgia, COPD, HTN, HLD, asthma  Clinical Impression  Pt pleasant and moving well having just returned from walking in hallway. Pt able to walk in room without Rw with noted increased hip flexion with pt report of 6 falls in the last year with recommendation for RW use for increased distance and safety. Pt's bil LE strength WFL. Pt educated for walking program and HEp with pt able to verbalize. No further acute therapy needs with pt aware and agreeable.   spO2 95% on RA HR 82-100    Follow Up Recommendations No PT follow up    Equipment Recommendations  None recommended by PT    Recommendations for Other Services       Precautions / Restrictions Precautions Precautions: Fall      Mobility  Bed Mobility Overal bed mobility: Independent             General bed mobility comments: pt able to enter and exit bed without assist    Transfers Overall transfer level: Modified independent               General transfer comment: pt able to stand from bed and chair without assist  Ambulation/Gait Ambulation/Gait assistance: Independent Gait Distance (Feet): 30 Feet Assistive device: None Gait Pattern/deviations: Step-through pattern;Decreased stride length;Trunk flexed   Gait velocity interpretation: >2.62 ft/sec, indicative of community ambulatory General Gait Details: pt with increased hip flexion with report of bil hip pain but steady on feet. Pt walked 30' x 2 in room. Declined hall ambulation as had just returned from walking  Stairs            Wheelchair Mobility    Modified Rankin (Stroke Patients Only)       Balance Overall balance assessment: Mild deficits observed, not  formally tested                                           Pertinent Vitals/Pain Pain Assessment: Faces Faces Pain Scale: Hurts even more Pain Location: RLE Pain Descriptors / Indicators: Operative site guarding Pain Intervention(s): Limited activity within patient's tolerance;Monitored during session;Patient requesting pain meds-RN notified;Repositioned    Home Living Family/patient expects to be discharged to:: Private residence Living Arrangements: Children Available Help at Discharge: Family Type of Home: House Home Access: Stairs to enter   Secretary/administrator of Steps: 1 Home Layout: One level Home Equipment: Environmental consultant - 2 wheels;Cane - quad;Cane - single point;Grab bars - tub/shower;Shower seat Additional Comments: daughter lives in apartment over storage building on pt's property and is available to asisst as needed as is her son    Prior Function Level of Independence: Independent               Hand Dominance   Dominant Hand: Right    Extremity/Trunk Assessment   Upper Extremity Assessment Upper Extremity Assessment: Overall WFL for tasks assessed    Lower Extremity Assessment Lower Extremity Assessment: Overall WFL for tasks assessed    Cervical / Trunk Assessment Cervical / Trunk Assessment: Normal  Communication   Communication: No difficulties  Cognition Arousal/Alertness: Awake/alert Behavior During Therapy: WFL for tasks assessed/performed Overall Cognitive Status: Within Functional  Limits for tasks assessed                                        General Comments General comments (skin integrity, edema, etc.): VSS    Exercises General Exercises - Lower Extremity Long Arc Quad: AROM;Both;Seated;10 reps Hip Flexion/Marching: AROM;Seated;Both;10 reps   Assessment/Plan    PT Assessment Patent does not need any further PT services  PT Problem List         PT Treatment Interventions      PT Goals  (Current goals can be found in the Care Plan section)  Acute Rehab PT Goals Patient Stated Goal: to be able to walk to the mail box PT Goal Formulation: All assessment and education complete, DC therapy    Frequency     Barriers to discharge        Co-evaluation               AM-PAC PT "6 Clicks" Mobility  Outcome Measure Help needed turning from your back to your side while in a flat bed without using bedrails?: None Help needed moving from lying on your back to sitting on the side of a flat bed without using bedrails?: None Help needed moving to and from a bed to a chair (including a wheelchair)?: None Help needed standing up from a chair using your arms (e.g., wheelchair or bedside chair)?: None Help needed to walk in hospital room?: None Help needed climbing 3-5 steps with a railing? : A Little 6 Click Score: 23    End of Session   Activity Tolerance: Patient tolerated treatment well Patient left: in chair;with call bell/phone within reach;with family/visitor present Nurse Communication: Mobility status PT Visit Diagnosis: Other abnormalities of gait and mobility (R26.89)    Time: 1040-1058 PT Time Calculation (min) (ACUTE ONLY): 18 min   Charges:   PT Evaluation $PT Eval Low Complexity: 1 Low          Lotus Gover P, PT Acute Rehabilitation Services Pager: [phone] Office: [phone]   Enedina Finner Eriq Hufford 02/15/2021, 11:47 AM

## 2021-02-15 NOTE — Progress Notes (Signed)
PHARMACIST LIPID MONITORING   Norma Scott is a 70 y.o. female admitted on 02/14/2021 for RIGHT FEMORAL ENDARTERECTOMY.  Pharmacy has been consulted to optimize lipid-lowering therapy with the indication of secondary prevention for clinical ASCVD.  Recent Labs:  Lipid Panel (last 6 months):   Lab Results  Component Value Date   CHOL 107 02/15/2021   TRIG 39 02/15/2021   HDL 38 (L) 02/15/2021   CHOLHDL 2.8 02/15/2021   VLDL 8 02/15/2021   LDLCALC 61 02/15/2021    Hepatic function panel (last 6 months):   Lab Results  Component Value Date   AST 29 02/12/2021   ALT 20 02/12/2021   ALKPHOS 81 02/12/2021   BILITOT 0.5 02/12/2021    SCr (since admission):   Serum creatinine: 1 mg/dL 44/96/75 9163 Estimated creatinine clearance: 53.2 mL/min  Current therapy and lipid therapy tolerance Current lipid-lowering therapy: Atorvastatin 40 mg Previous lipid-lowering therapies (if applicable): none Documented or reported allergies or intolerances to lipid-lowering therapies (if applicable): none  Assessment:   Patient is taking high intensity statin. Her lipid levels do no warrant dose increase at this time.  Plan:    1.Statin intensity (high intensity recommended for all patients regardless of the LDL):  No statin changes. The patient is already on a high intensity statin.  2.Add ezetimibe (if any one of the following):   Not indicated at this time.  3.Refer to lipid clinic:   No  4.Follow-up with:  Primary care provider - Merri Brunette, MD  5.Follow-up labs after discharge:  No changes in lipid therapy, repeat a lipid panel in one year.      Thank you for allowing pharmacy to participate in this patient's care.  Enos Fling, PharmD PGY1 Pharmacy Resident 02/15/2021 1:17 PM Check [email or web address] for unit specific pharmacy number

## 2021-02-15 NOTE — Progress Notes (Addendum)
[facility] [place]  Subjective  - Doing well over all   Objective (!) 119/53 60 97.8 F (36.6 C) (Oral) 16 93%  Intake/Output Summary (Last 24 hours) at 02/15/2021 0735 Last data filed at 02/15/2021 0254 Gross per 24 hour  Intake 2300 ml  Output 1730 ml  Net 570 ml    Palpable DP right foot, motor intact Right groin incisional vac in place to suction Lungs non labored breathing Tolerating PO's no N/V   Assessment/Planning: POD # 1  PROCEDURE:   1) right great saphenous vein harvest 2) extended right femoral endarterectomy and profundoplasty with greater saphenous vein patch angioplasty 3) right common iliac artery stenting (8 x 39 mm VBX)  Good inflow with palpable DP right LE Wound vac removed, dry guaze placed over right groin incision F/U in 2-3 weeks for incision check and ABI's. Pending discharge possible tomorrow  Mosetta Pigeon 02/15/2021 7:35 AM --  Laboratory Lab Results: Recent Labs    02/12/21 0950 02/15/21 0446  WBC 11.9* 17.8*  HGB 13.2 10.1*  HCT 40.4 29.8*  PLT 237 235   BMET Recent Labs    02/12/21 0950 02/15/21 0446  NA 137 132*  K 4.2 5.8*  CL 102 101  CO2 23 24  GLUCOSE 114* 169*  BUN 17 15  CREATININE 0.95 1.00  CALCIUM 9.0 8.2*    COAG Lab Results  Component Value Date   INR 0.9 02/12/2021   INR 1.0 02/21/2020   INR 0.97 07/17/2017   No results found for: PTT  VASCULAR STAFF ADDENDUM: I have independently interviewed and examined the patient. I agree with the above.  Looks good POD#1 extended right femoral endarterectomy, profundaplasty and retrograde common iliac stent. Trace DP pulse palpable in R foot. Strong doppler flow. Doing well ambulating about the halls without pain in R calf or foot. Prevena vac malfunction. Will transition to daily gauze dressings. Home tomorrow.  Norma Scott. Lenell Antu, MD [facility] Phone Number: [phone] 02/15/2021 11:17 AM

## 2021-02-15 NOTE — Evaluation (Signed)
Occupational Therapy Evaluation Patient Details Name: Norma Scott MRN: [id number] DOB: 02/18/1951 Today's Date: 02/15/2021    History of Present Illness 70 yo admitted 7/22 for Rt fem endarterectomy with angioplasty due to PAD with claudication. PMHx: CAD, fibromyalgia, COPD, HTN, HLD, asthma   Clinical Impression   Patient evaluated by Occupational Therapy with no further acute OT needs identified. All education has been completed and the patient has no further questions. Pt is able to complete ADLs and functional mobility with supervision.  She demonstrates good safety awareness, has good family support, and has all needed DME.  See below for any follow-up Occupational Therapy or equipment needs. OT is signing off. Thank you for this referral.     Follow Up Recommendations  No OT follow up;Supervision - Intermittent    Equipment Recommendations  None recommended by OT    Recommendations for Other Services       Precautions / Restrictions Precautions Precautions: Fall      Mobility Bed Mobility Overal bed mobility: Independent                  Transfers                      Balance Overall balance assessment: Mild deficits observed, not formally tested                                         ADL either performed or assessed with clinical judgement   ADL Overall ADL's : Needs assistance/impaired Eating/Feeding: Independent   Grooming: Wash/dry hands;Wash/dry face;Oral care;Brushing hair;Supervision/safety;Standing   Upper Body Bathing: Set up;Sitting   Lower Body Bathing: Supervison/ safety;Sit to/from stand   Upper Body Dressing : Set up;Sitting   Lower Body Dressing: Supervision/safety;Sit to/from stand Lower Body Dressing Details (indicate cue type and reason): able to perform figure 4 Toilet Transfer: Supervision/safety;Ambulation;Comfort height toilet;Grab bars;RW   Toileting- Clothing Manipulation and Hygiene:  Supervision/safety;Sit to/from stand       Functional mobility during ADLs: Supervision/safety;Rolling walker General ADL Comments: discussed safety with management of pets     Vision Patient Visual Report: No change from baseline       Perception     Praxis      Pertinent Vitals/Pain Pain Assessment: Faces Faces Pain Scale: Hurts little more Pain Location: Rt groin with movement Pain Descriptors / Indicators: Operative site guarding Pain Intervention(s): Monitored during session     Hand Dominance Right   Extremity/Trunk Assessment Upper Extremity Assessment Upper Extremity Assessment: Overall WFL for tasks assessed   Lower Extremity Assessment Lower Extremity Assessment: Defer to PT evaluation   Cervical / Trunk Assessment Cervical / Trunk Assessment: Normal   Communication Communication Communication: No difficulties   Cognition Arousal/Alertness: Awake/alert Behavior During Therapy: WFL for tasks assessed/performed Overall Cognitive Status: Within Functional Limits for tasks assessed                                     General Comments  VSS    Exercises     Shoulder Instructions      Home Living Family/patient expects to be discharged to:: Private residence Living Arrangements: Children Available Help at Discharge: Family Type of Home: House Home Access: Stairs to enter Secretary/administrator of Steps: 1   Home Layout: One level  Bathroom Shower/Tub: Producer, television/film/video: Standard Bathroom Accessibility: Yes How Accessible: Accessible via walker Home Equipment: Walker - 2 wheels;Cane - quad;Cane - single point;Grab bars - tub/shower;Shower seat   Additional Comments: daughter lives in apartment over storage building on pt's property and is available to asisst as needed as is her son      Prior Functioning/Environment Level of Independence: Independent                 OT Problem List: Decreased  activity tolerance      OT Treatment/Interventions:      OT Goals(Current goals can be found in the care plan section) Acute Rehab OT Goals Patient Stated Goal: to be able to walk to the mail box OT Goal Formulation: All assessment and education complete, DC therapy  OT Frequency:     Barriers to D/C:            Co-evaluation              AM-PAC OT "6 Clicks" Daily Activity     Outcome Measure Help from another person eating meals?: None Help from another person taking care of personal grooming?: A Little Help from another person toileting, which includes using toliet, bedpan, or urinal?: A Little Help from another person bathing (including washing, rinsing, drying)?: A Little Help from another person to put on and taking off regular upper body clothing?: A Little Help from another person to put on and taking off regular lower body clothing?: A Little 6 Click Score: 19   End of Session Equipment Utilized During Treatment: Rolling walker Nurse Communication: Mobility status  Activity Tolerance: Patient tolerated treatment well Patient left: in chair;with call bell/phone within reach  OT Visit Diagnosis: Unsteadiness on feet (R26.81);Pain Pain - Right/Left: Right Pain - part of body: Hip                Time: 1010-1036 OT Time Calculation (min): 26 min Charges:  OT General Charges $OT Visit: 1 Visit OT Evaluation $OT Eval Low Complexity: 1 Low OT Treatments $Self Care/Home Management : 8-22 mins  Eber Jones OTR/L Acute Rehabilitation Services Pager [phone] Office [phone]   Jeani Hawking M 02/15/2021, 10:46 AM

## 2021-02-16 ENCOUNTER — Other Ambulatory Visit: Payer: Self-pay | Admitting: Vascular Surgery

## 2021-02-16 MED ORDER — PANTOPRAZOLE SODIUM 40 MG PO TBEC: 40.0000 mg | DELAYED_RELEASE_TABLET | Freq: Every day | ORAL | 1 refills | Status: DC

## 2021-02-16 MED ORDER — CLOPIDOGREL BISULFATE 75 MG PO TABS: 75.0000 mg | ORAL_TABLET | Freq: Every day | ORAL | 1 refills | Status: DC

## 2021-02-16 NOTE — Progress Notes (Addendum)
[facility] [place]  Subjective  - She thinks she is now ready to go home.   Objective (!) 96/40 66 98.2 F (36.8 C) (Oral) 18 97%  Intake/Output Summary (Last 24 hours) at 02/16/2021 0744 Last data filed at 02/15/2021 1300 Gross per 24 hour  Intake 120 ml  Output --  Net 120 ml   Palpable DP pulse right LE Right groin incision healing well, replaced dry 4 x 4 over gorin incision Lungs non labored breathing   Assessment/Planning: POD # 2  1) right great saphenous vein harvest 2) extended right femoral endarterectomy and profundoplasty with greater saphenous vein patch angioplasty 3) right common iliac artery stenting (8 x 39 mm VBX) Continue ASA, Plavix and Lipitor F/U in 2-3 weeks with ABI's  Mosetta Pigeon 02/16/2021 7:44 AM --  Laboratory Lab Results: Recent Labs    02/15/21 0446  WBC 17.8*  HGB 10.1*  HCT 29.8*  PLT 235   BMET Recent Labs    02/15/21 0446  NA 132*  K 5.8*  CL 101  CO2 24  GLUCOSE 169*  BUN 15  CREATININE 1.00  CALCIUM 8.2*    COAG Lab Results  Component Value Date   INR 0.9 02/12/2021   INR 1.0 02/21/2020   INR 0.97 07/17/2017   No results found for: PTT  VASCULAR STAFF ADDENDUM: I have independently interviewed and examined the patient. I agree with the above.  Ready for home. Follow up with [facility] PA in 2 weeks for wound check, ABI, arterial duplex, then every six months for PAD surveillance.  Continue ASA / Statin indefinitely.  Rande Brunt. Lenell Antu, MD [facility] Phone Number: [phone] 02/16/2021 8:43 AM

## 2021-02-17 ENCOUNTER — Encounter (HOSPITAL_COMMUNITY): Payer: Self-pay | Admitting: Vascular Surgery

## 2021-02-17 ENCOUNTER — Ambulatory Visit (INDEPENDENT_AMBULATORY_CARE_PROVIDER_SITE_OTHER): Payer: Medicare HMO

## 2021-02-17 DIAGNOSIS — I6340 Cerebral infarction due to embolism of unspecified cerebral artery: Secondary | ICD-10-CM | POA: Diagnosis not present

## 2021-02-17 NOTE — Discharge Summary (Signed)
[facility] Discharge Summary   Patient ID:  Norma Scott MRN: [id number] DOB/AGE: 1951/05/28 70 y.o.  Admit date: 02/14/2021 Discharge date: 02/16/21 Date of Surgery: 02/14/2021 Surgeon: Surgeon(s): Leonie Douglas, MD  Admission Diagnosis: PAD (peripheral artery disease) [facility]) [I73.9]  Discharge Diagnoses:  PAD (peripheral artery disease) (HCC) [I73.9]  Secondary Diagnoses: Past Medical History:  Diagnosis Date   Allergy    allergic rhinitis   Arteriosclerotic coronary artery disease 05/21/2015   Asthma    CAD (coronary artery disease)    patient says no problems   dr Severiano Gilbert pcp   Carotid artery occlusion    Cervical disc displacement    x2   COPD (chronic obstructive pulmonary disease) (HCC)    Dyspnea    Esophageal reflux    Essential hypertension 05/21/2015   Family history of adverse reaction to anesthesia    mother hard to wake and affected b/p   Fibromyalgia    Gall stones    Generalized anxiety disorder    Headache    Hyperlipidemia    Lumbar disc herniation    L5-S1   Mini stroke (HCC)    Peripheral vascular disease (HCC)    Sleep apnea    Stroke [facility])    July 2021   TIA (transient ischemic attack) 05/21/2015    Procedure(s): RIGHT FEMORAL EXTENDED ENDARTERECTOMY WITH VEIN PATCH ANGIOPLASTY USING GREATER SAPHENOUS VEIN HARVEST AND PROFUNDOPLASTY INSERTION OF RIGHT ILIAC ARTERY STENT APPLICATION OF WOUND VAC  Discharged Condition: good  HPI: Norma Scott is a 70 y.o. 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   Transradial aortogram, lower extremity angiogram personally reviewed. 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 female is S/P  Procedure(s): RIGHT FEMORAL EXTENDED ENDARTERECTOMY WITH VEIN PATCH ANGIOPLASTY USING GREATER SAPHENOUS VEIN HARVEST AND PROFUNDOPLASTY INSERTION OF RIGHT ILIAC ARTERY STENT APPLICATION OF WOUND VAC  Wound vac was not working so it was d/c'd on POD# 1 She had pain issues and poor mobility POD# 1.  POD day 2 she was in better pain control and improved mobility.  She had a palpable DP on the right LE and incision was clean and dry 4 x 4 was placed.  No PT recommended at home.  Discharged in stable condition.  Plan for left femoral endart in the near future.   Significant Diagnostic Studies: CBC Lab Results  Component Value Date   WBC 17.8 (H) 02/15/2021   HGB 10.1 (L) 02/15/2021   HCT 29.8 (L) 02/15/2021   MCV 89.2 02/15/2021   PLT 235 02/15/2021    BMET    Component Value Date/Time   NA 132 (L) 02/15/2021 0446   K 5.8 (H) 02/15/2021 0446   CL 101 02/15/2021 0446   CO2 24 02/15/2021 0446   GLUCOSE 169 (H) 02/15/2021 0446   BUN 15 02/15/2021 0446   CREATININE 1.00 02/15/2021 0446   CREATININE 0.73 05/31/2015 1420   CALCIUM 8.2 (L) 02/15/2021 0446   GFRNONAA >60 02/15/2021 0446   GFRAA >60 02/23/2020 0754   COAG Lab Results  Component Value Date   INR 0.9 02/12/2021   INR 1.0 02/21/2020   INR 0.97 07/17/2017  Disposition:  Discharge to :Home Discharge Instructions     Call MD for:  redness, tenderness, or signs of infection (pain, swelling, bleeding, redness, odor or green/yellow discharge around incision site)   Complete by: As directed    Call MD for:  redness, tenderness, or signs of infection (pain, swelling, bleeding, redness, odor or green/yellow discharge around incision site)   Complete by: As directed    Call MD for:  severe or increased pain, loss or decreased feeling  in affected limb(s)   Complete by: As  directed    Call MD for:  severe or increased pain, loss or decreased feeling  in affected limb(s)   Complete by: As directed    Call MD for:  temperature >100.5   Complete by: As directed    Call MD for:  temperature >100.5   Complete by: As directed    Discharge instructions   Complete by: As directed    Shower daily and keep right groin incision clean and dry in between showers.   Resume previous diet   Complete by: As directed    Resume previous diet   Complete by: As directed       Allergies as of 02/16/2021       Reactions   Bupropion Other (See Comments)   Hallucinations   Septra [sulfamethoxazole-trimethoprim] Nausea And Vomiting   Sulfa Antibiotics Nausea And Vomiting   Zithromax [azithromycin] Hives   Cymbalta [duloxetine Hcl] Other (See Comments)   fatigue        Medication List     STOP taking these medications    omeprazole 40 MG capsule Commonly known as: PRILOSEC Replaced by: pantoprazole 40 MG tablet       TAKE these medications    albuterol 108 (90 Base) MCG/ACT inhaler Commonly known as: Proventil HFA Inhale 1 puff into the lungs every 6 (six) hours as needed for wheezing or shortness of breath. What changed: Another medication with the same name was removed. Continue taking this medication, and follow the directions you see here.   alendronate 70 MG tablet Commonly known as: FOSAMAX Take 70 mg by mouth every Saturday.   amLODipine 10 MG tablet Commonly known as: NORVASC Take 10 mg by mouth daily.   aspirin 81 MG EC tablet Take 1 tablet (81 mg total) by mouth daily. Swallow whole.   atorvastatin 40 MG tablet Commonly known as: LIPITOR Take 40 mg by mouth at bedtime.   B-complex with vitamin C tablet Take 1 tablet by mouth daily.   CALCIUM 600 + D PO Take 1 tablet by mouth daily.   clonazePAM 0.5 MG tablet Commonly known as: KLONOPIN Take 0.5 mg by mouth 3 (three) times daily as needed for anxiety.   clopidogrel 75 MG  tablet Commonly known as: PLAVIX Take 1 tablet (75 mg total) by mouth daily at 6 (six) AM.   CoQ-10 100 MG Caps Take 100 mg by mouth daily after supper.   FLUoxetine 20 MG capsule Commonly known as: PROZAC Take 60 mg by mouth daily.   furosemide 20 MG tablet Commonly known as: LASIX Take 20 mg by mouth daily.   gabapentin 300 MG capsule Commonly known as: NEURONTIN Take 300-600 mg by mouth See admin instructions. Take 300 mg in the morning, 300 mg at noon, and 600 mg at bedtime   hydrocortisone cream 1 % Apply 1 application topically daily.   ipratropium 0.03 % nasal spray Commonly known as: ATROVENT Place 2 sprays into both nostrils every  12 (twelve) hours.   lisinopril 30 MG tablet Commonly known as: ZESTRIL Take 30 mg by mouth daily.   loratadine 10 MG tablet Commonly known as: CLARITIN Take 10 mg by mouth daily.   metoprolol tartrate 25 MG tablet Commonly known as: LOPRESSOR Take 25 mg by mouth 2 (two) times daily.   multivitamin with minerals Tabs tablet Take 1 tablet by mouth daily.   olopatadine 0.1 % ophthalmic solution Commonly known as: PATANOL Place 1 drop into both eyes daily as needed (dry/itchy eyes).   pantoprazole 40 MG tablet Commonly known as: PROTONIX Take 1 tablet (40 mg total) by mouth daily. Replaces: omeprazole 40 MG capsule   potassium gluconate 595 (99 K) MG Tabs tablet Take 595 mg by mouth daily.   tiZANidine 4 MG tablet Commonly known as: ZANAFLEX Take 4 mg by mouth every 8 (eight) hours as needed for muscle spasms.   Trelegy Ellipta 100-62.5-25 MCG/INH Aepb Generic drug: Fluticasone-Umeclidin-Vilant Inhale 1 puff into the lungs daily.   Triple Omega-3-6-9 Caps Take 1 capsule by mouth daily.   varenicline 1 MG tablet Commonly known as: CHANTIX Take 1 tablet by mouth twice daily   ZzzQuil 50 MG/30ML Liqd Generic drug: diphenhydrAMINE HCl Take 50 mg by mouth at bedtime as needed (sleep).       Verbal and written  Discharge instructions given to the patient. Wound care per Discharge AVS  Follow-up Information     Leonie Douglas, MD Follow up.   Specialties: Vascular Surgery, Interventional Cardiology Why: office will call you to arrange your appt (sent) Contact information: [address] [place] [address] [phone]                 Signed: Mosetta Pigeon 02/17/2021, 7:40 AM

## 2021-02-18 ENCOUNTER — Ambulatory Visit (INDEPENDENT_AMBULATORY_CARE_PROVIDER_SITE_OTHER): Payer: Medicare HMO | Admitting: Physician Assistant

## 2021-02-18 ENCOUNTER — Other Ambulatory Visit: Payer: Self-pay

## 2021-02-18 VITALS — BP 125/69 | HR 94 | Temp 97.6°F | Resp 20 | Ht 59.0 in | Wt 199.3 lb

## 2021-02-18 DIAGNOSIS — I70221 Atherosclerosis of native arteries of extremities with rest pain, right leg: Secondary | ICD-10-CM

## 2021-02-18 NOTE — Progress Notes (Signed)
POST OPERATIVE OFFICE NOTE    CC:  F/u for surgery  HPI:  This is a 70 y.o. female who is s/p   1) right great saphenous vein harvest 2) extended right femoral endarterectomy and profundoplasty with greater saphenous vein patch angioplasty 3) right common iliac artery stenting (8 x 39 mm VBX)  on 02/14/21 by Dr. Lenell Antu.     Pt returns today for follow up.  Pt states She is doing well and walking without symptoms of claudication, rest pain or non healing wounds.  Allergies  Allergen Reactions   Bupropion Other (See Comments)    Hallucinations    Septra [Sulfamethoxazole-Trimethoprim] Nausea And Vomiting   Sulfa Antibiotics Nausea And Vomiting   Zithromax [Azithromycin] Hives   Cymbalta [Duloxetine Hcl] Other (See Comments)    fatigue    Current Outpatient Medications  Medication Sig Dispense Refill   albuterol (PROVENTIL HFA) 108 (90 Base) MCG/ACT inhaler Inhale 1 puff into the lungs every 6 (six) hours as needed for wheezing or shortness of breath. 1 Inhaler 0   alendronate (FOSAMAX) 70 MG tablet Take 70 mg by mouth every Saturday.     amLODipine (NORVASC) 10 MG tablet Take 10 mg by mouth daily.     aspirin EC 81 MG EC tablet Take 1 tablet (81 mg total) by mouth daily. Swallow whole. 30 tablet 11   atorvastatin (LIPITOR) 40 MG tablet Take 40 mg by mouth at bedtime.      B Complex-C (B-COMPLEX WITH VITAMIN C) tablet Take 1 tablet by mouth daily.     Calcium Carb-Cholecalciferol (CALCIUM 600 + D PO) Take 1 tablet by mouth daily.     clonazePAM (KLONOPIN) 0.5 MG tablet Take 0.5 mg by mouth 3 (three) times daily as needed for anxiety.     clopidogrel (PLAVIX) 75 MG tablet Take 1 tablet (75 mg total) by mouth daily at 6 (six) AM. 30 tablet 1   Coenzyme Q10 (COQ-10) 100 MG CAPS Take 100 mg by mouth daily after supper.     diphenhydrAMINE HCl (ZZZQUIL) 50 MG/30ML LIQD Take 50 mg by mouth at bedtime as needed (sleep).     FLUoxetine (PROZAC) 20 MG capsule Take 60 mg by mouth daily.       furosemide (LASIX) 20 MG tablet Take 20 mg by mouth daily.     gabapentin (NEURONTIN) 300 MG capsule Take 300-600 mg by mouth See admin instructions. Take 300 mg in the morning, 300 mg at noon, and 600 mg at bedtime     hydrocortisone cream 1 % Apply 1 application topically daily.     ipratropium (ATROVENT) 0.03 % nasal spray Place 2 sprays into both nostrils every 12 (twelve) hours. 30 mL 12   lisinopril (ZESTRIL) 30 MG tablet Take 30 mg by mouth daily.      loratadine (CLARITIN) 10 MG tablet Take 10 mg by mouth daily.     metoprolol tartrate (LOPRESSOR) 25 MG tablet Take 25 mg by mouth 2 (two) times daily.     Multiple Vitamin (MULTIVITAMIN WITH MINERALS) TABS tablet Take 1 tablet by mouth daily.     olopatadine (PATANOL) 0.1 % ophthalmic solution Place 1 drop into both eyes daily as needed (dry/itchy eyes).     Omega 3-6-9 Fatty Acids (TRIPLE OMEGA-3-6-9) CAPS Take 1 capsule by mouth daily.     pantoprazole (PROTONIX) 40 MG tablet Take 1 tablet (40 mg total) by mouth daily. 30 tablet 1   potassium gluconate 595 (99 K) MG TABS tablet Take  595 mg by mouth daily.     tiZANidine (ZANAFLEX) 4 MG tablet Take 4 mg by mouth every 8 (eight) hours as needed for muscle spasms.     TRELEGY ELLIPTA 100-62.5-25 MCG/INH AEPB Inhale 1 puff into the lungs daily.     varenicline (CHANTIX) 1 MG tablet Take 1 tablet by mouth twice daily 42 tablet 0   No current facility-administered medications for this visit.     ROS:  See HPI  Physical Exam:     Incision:  Healing very well without breakdown, drainage or erythema Extremities:  B doppler signals brisk in all three tibials    Assessment/Plan:  This is a 70 y.o. female who is s/p: 1) right great saphenous vein harvest 2) extended right femoral endarterectomy and profundoplasty with greater saphenous vein patch angioplasty 3) right common iliac artery stenting (8 x 39 mm VBX)  She will f/u in 3-4 weeks for ABI's.  She has a chronic left iliac  artery occlusion that is currently asymptomatic.  Cont. Dry dressing as needed for the right groin.   Mosetta Pigeon PA-C [facility] [phone]   Clinic MD:  Lenell Antu

## 2021-02-19 LAB — CUP PACEART REMOTE DEVICE CHECK
Date Time Interrogation Session: 20220725110318
Implantable Pulse Generator Implant Date: 20210730

## 2021-02-23 DIAGNOSIS — J449 Chronic obstructive pulmonary disease, unspecified: Secondary | ICD-10-CM | POA: Diagnosis not present

## 2021-02-26 DIAGNOSIS — G4736 Sleep related hypoventilation in conditions classified elsewhere: Secondary | ICD-10-CM | POA: Diagnosis not present

## 2021-02-26 DIAGNOSIS — R0902 Hypoxemia: Secondary | ICD-10-CM | POA: Diagnosis not present

## 2021-02-26 DIAGNOSIS — G4733 Obstructive sleep apnea (adult) (pediatric): Secondary | ICD-10-CM | POA: Diagnosis not present

## 2021-03-06 ENCOUNTER — Telehealth: Payer: Self-pay

## 2021-03-06 DIAGNOSIS — M5416 Radiculopathy, lumbar region: Secondary | ICD-10-CM | POA: Diagnosis not present

## 2021-03-06 DIAGNOSIS — M4316 Spondylolisthesis, lumbar region: Secondary | ICD-10-CM | POA: Diagnosis not present

## 2021-03-06 DIAGNOSIS — Z6837 Body mass index (BMI) 37.0-37.9, adult: Secondary | ICD-10-CM | POA: Diagnosis not present

## 2021-03-06 NOTE — Telephone Encounter (Signed)
Patient calls today to ask about cessation of Plavix for spine injections/surgery. She is s/p endarterectomy and iliac stenting on 02/14/21. Discussed with Dr. Lenell Antu and ideally patient will continue Plavix until at least 05/17/21. She cannot stop the aspirin. Patient aware.

## 2021-03-12 ENCOUNTER — Other Ambulatory Visit: Payer: Self-pay | Admitting: Pulmonary Disease

## 2021-03-12 DIAGNOSIS — Z720 Tobacco use: Secondary | ICD-10-CM

## 2021-03-14 ENCOUNTER — Ambulatory Visit: Payer: Medicare HMO | Admitting: Cardiovascular Disease

## 2021-03-14 DIAGNOSIS — G4733 Obstructive sleep apnea (adult) (pediatric): Secondary | ICD-10-CM | POA: Diagnosis not present

## 2021-03-14 NOTE — Progress Notes (Signed)
Carelink Summary Report / Loop Recorder 

## 2021-03-18 ENCOUNTER — Encounter (HOSPITAL_COMMUNITY): Payer: Medicare HMO

## 2021-03-18 ENCOUNTER — Encounter: Payer: Medicare HMO | Admitting: Vascular Surgery

## 2021-03-21 ENCOUNTER — Other Ambulatory Visit: Payer: Self-pay

## 2021-03-21 DIAGNOSIS — I739 Peripheral vascular disease, unspecified: Secondary | ICD-10-CM

## 2021-03-24 ENCOUNTER — Ambulatory Visit (INDEPENDENT_AMBULATORY_CARE_PROVIDER_SITE_OTHER): Payer: Medicare HMO

## 2021-03-24 DIAGNOSIS — I6340 Cerebral infarction due to embolism of unspecified cerebral artery: Secondary | ICD-10-CM

## 2021-03-26 DIAGNOSIS — J449 Chronic obstructive pulmonary disease, unspecified: Secondary | ICD-10-CM | POA: Diagnosis not present

## 2021-03-26 LAB — CUP PACEART REMOTE DEVICE CHECK
Date Time Interrogation Session: 20220830174559
Implantable Pulse Generator Implant Date: 20210730

## 2021-03-29 DIAGNOSIS — G4733 Obstructive sleep apnea (adult) (pediatric): Secondary | ICD-10-CM | POA: Diagnosis not present

## 2021-03-29 DIAGNOSIS — G4736 Sleep related hypoventilation in conditions classified elsewhere: Secondary | ICD-10-CM | POA: Diagnosis not present

## 2021-03-29 DIAGNOSIS — R0902 Hypoxemia: Secondary | ICD-10-CM | POA: Diagnosis not present

## 2021-03-31 NOTE — Progress Notes (Signed)
POST OPERATIVE OFFICE NOTE    CC:  F/u for surgery  HPI:  This is a 70 y.o. female who is s/p   1) right great saphenous vein harvest 2) extended right femoral endarterectomy and profundoplasty with greater saphenous vein patch angioplasty 3) right common iliac artery stenting (8 x 39 mm VBX)  on 02/14/21    Pt returns today for follow up.  Pt states She is doing well and walking without symptoms of claudication, rest pain or non healing wounds.  Allergies  Allergen Reactions   Bupropion Other (See Comments)    Hallucinations    Septra [Sulfamethoxazole-Trimethoprim] Nausea And Vomiting   Sulfa Antibiotics Nausea And Vomiting   Zithromax [Azithromycin] Hives   Cymbalta [Duloxetine Hcl] Other (See Comments)    fatigue    Current Outpatient Medications  Medication Sig Dispense Refill   albuterol (PROVENTIL HFA) 108 (90 Base) MCG/ACT inhaler Inhale 1 puff into the lungs every 6 (six) hours as needed for wheezing or shortness of breath. 1 Inhaler 0   alendronate (FOSAMAX) 70 MG tablet Take 70 mg by mouth every Saturday.     amLODipine (NORVASC) 10 MG tablet Take 10 mg by mouth daily.     aspirin EC 81 MG EC tablet Take 1 tablet (81 mg total) by mouth daily. Swallow whole. 30 tablet 11   atorvastatin (LIPITOR) 40 MG tablet Take 40 mg by mouth at bedtime.      B Complex-C (B-COMPLEX WITH VITAMIN C) tablet Take 1 tablet by mouth daily.     Calcium Carb-Cholecalciferol (CALCIUM 600 + D PO) Take 1 tablet by mouth daily.     clonazePAM (KLONOPIN) 0.5 MG tablet Take 0.5 mg by mouth 3 (three) times daily as needed for anxiety.     clopidogrel (PLAVIX) 75 MG tablet Take 1 tablet (75 mg total) by mouth daily at 6 (six) AM. 30 tablet 1   Coenzyme Q10 (COQ-10) 100 MG CAPS Take 100 mg by mouth daily after supper.     diphenhydrAMINE HCl (ZZZQUIL) 50 MG/30ML LIQD Take 50 mg by mouth at bedtime as needed (sleep).     FLUoxetine (PROZAC) 20 MG capsule Take 60 mg by mouth daily.      furosemide  (LASIX) 20 MG tablet Take 20 mg by mouth daily.     gabapentin (NEURONTIN) 300 MG capsule Take 300-600 mg by mouth See admin instructions. Take 300 mg in the morning, 300 mg at noon, and 600 mg at bedtime     hydrocortisone cream 1 % Apply 1 application topically daily.     ipratropium (ATROVENT) 0.03 % nasal spray Place 2 sprays into both nostrils every 12 (twelve) hours. 30 mL 12   lisinopril (ZESTRIL) 30 MG tablet Take 30 mg by mouth daily.      loratadine (CLARITIN) 10 MG tablet Take 10 mg by mouth daily.     metoprolol tartrate (LOPRESSOR) 25 MG tablet Take 25 mg by mouth 2 (two) times daily.     Multiple Vitamin (MULTIVITAMIN WITH MINERALS) TABS tablet Take 1 tablet by mouth daily.     olopatadine (PATANOL) 0.1 % ophthalmic solution Place 1 drop into both eyes daily as needed (dry/itchy eyes).     Omega 3-6-9 Fatty Acids (TRIPLE OMEGA-3-6-9) CAPS Take 1 capsule by mouth daily.     pantoprazole (PROTONIX) 40 MG tablet Take 1 tablet (40 mg total) by mouth daily. 30 tablet 1   potassium gluconate 595 (99 K) MG TABS tablet Take 595 mg by mouth  daily.     tiZANidine (ZANAFLEX) 4 MG tablet Take 4 mg by mouth every 8 (eight) hours as needed for muscle spasms.     TRELEGY ELLIPTA 100-62.5-25 MCG/INH AEPB Inhale 1 puff into the lungs daily.     varenicline (CHANTIX) 1 MG tablet Take 1 tablet by mouth twice daily 42 tablet 3   No current facility-administered medications for this visit.     ROS:  See HPI  Physical Exam: Incision:  Healing very well without breakdown, drainage or erythema Extremities:  B doppler signals brisk in all three tibials    Assessment/Plan:  This is a 70 y.o. female who is s/p: 1) right great saphenous vein harvest 2) extended right femoral endarterectomy and profundoplasty with greater saphenous vein patch angioplasty 3) right common iliac artery stenting (8 x 39 mm VBX)  She will f/u in 3-4 weeks for ABI's.  She has a chronic left iliac artery occlusion that is  currently asymptomatic.  Follow up in 1 year with ABI.  Rande Brunt. Lenell Antu, MD [facility] Phone Number: [phone] 04/01/2021 1:58 PM

## 2021-04-01 ENCOUNTER — Ambulatory Visit (INDEPENDENT_AMBULATORY_CARE_PROVIDER_SITE_OTHER): Payer: Medicare HMO | Admitting: Vascular Surgery

## 2021-04-01 ENCOUNTER — Other Ambulatory Visit: Payer: Self-pay

## 2021-04-01 ENCOUNTER — Encounter: Payer: Self-pay | Admitting: Vascular Surgery

## 2021-04-01 ENCOUNTER — Ambulatory Visit (INDEPENDENT_AMBULATORY_CARE_PROVIDER_SITE_OTHER)
Admission: RE | Admit: 2021-04-01 | Discharge: 2021-04-01 | Disposition: A | Discharge status: Home / Self Care | Payer: Medicare HMO | Source: Ambulatory Visit | Attending: Vascular Surgery | Admitting: Vascular Surgery

## 2021-04-01 ENCOUNTER — Ambulatory Visit (HOSPITAL_COMMUNITY)
Admission: RE | Admit: 2021-04-01 | Discharge: 2021-04-01 | Disposition: A | Discharge status: Home / Self Care | Payer: Medicare HMO | Source: Ambulatory Visit | Attending: Vascular Surgery | Admitting: Vascular Surgery

## 2021-04-01 VITALS — BP 139/79 | HR 58 | Temp 98.2°F | Resp 20 | Ht 59.0 in | Wt 191.0 lb

## 2021-04-01 DIAGNOSIS — I739 Peripheral vascular disease, unspecified: Secondary | ICD-10-CM

## 2021-04-04 ENCOUNTER — Telehealth: Payer: Self-pay

## 2021-04-04 NOTE — Progress Notes (Signed)
Carelink Summary Report / Loop Recorder 

## 2021-04-04 NOTE — Telephone Encounter (Signed)
'  LINQ alert received. 4 new AF events, no EGM's for review Route to triage, no recent EGM's received."  Attempted to contact patient to send manual transmission to review EGM's.   No answer, LMTCB.

## 2021-04-09 NOTE — Telephone Encounter (Signed)
LINQ alert received. 3 AF episodes, duration 2-27min, CVR.  There continue to be no EGM's for review  Attempted to contact patient to send manual transmission. No answer, LMTCB.

## 2021-04-11 NOTE — Telephone Encounter (Signed)
Patient returning phone call.   Patient has ILR 2, unable to send manual remote. Consulted with Noreene Larsson, MDT. Awaiting recommendation.

## 2021-04-11 NOTE — Telephone Encounter (Signed)
Patient has a linq 22 and can not send manual transmissions. I let her speak with Leigh, rn.

## 2021-04-14 DIAGNOSIS — G4733 Obstructive sleep apnea (adult) (pediatric): Secondary | ICD-10-CM | POA: Diagnosis not present

## 2021-04-14 NOTE — Telephone Encounter (Signed)
Norma Scott - [employer] will come by tomorrow 04/15/21 to assist with trouble shooting to see events.

## 2021-04-15 NOTE — Telephone Encounter (Signed)
Report reviewed with Noreene Larsson,  She needs to contact tech support for additional assistance.  She will report back to [place] when she knows more.

## 2021-04-16 ENCOUNTER — Other Ambulatory Visit: Payer: Self-pay | Admitting: Physician Assistant

## 2021-04-17 ENCOUNTER — Other Ambulatory Visit: Payer: Self-pay

## 2021-04-17 MED ORDER — CLOPIDOGREL BISULFATE 75 MG PO TABS: 75.0000 mg | ORAL_TABLET | Freq: Every day | ORAL | 11 refills | Status: DC

## 2021-04-17 NOTE — Telephone Encounter (Signed)
Received report back from [employer].  The patient had 3 episodes which were deemed AF, however only text data was provided.  Per [employer], 2 of the 3 episodes were deemed false by AI.  The third episode was an in progress episode which can't be adjudicated by AI and also cannot have ECG stored.    Presenting Rate Rhythm today is as follows.  Forwarding to MD for determination if further action needed.  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

## 2021-04-25 DIAGNOSIS — J449 Chronic obstructive pulmonary disease, unspecified: Secondary | ICD-10-CM | POA: Diagnosis not present

## 2021-04-28 ENCOUNTER — Ambulatory Visit (INDEPENDENT_AMBULATORY_CARE_PROVIDER_SITE_OTHER): Payer: Medicare HMO

## 2021-04-28 DIAGNOSIS — I6340 Cerebral infarction due to embolism of unspecified cerebral artery: Secondary | ICD-10-CM | POA: Diagnosis not present

## 2021-04-28 DIAGNOSIS — R0902 Hypoxemia: Secondary | ICD-10-CM | POA: Diagnosis not present

## 2021-04-28 DIAGNOSIS — G4736 Sleep related hypoventilation in conditions classified elsewhere: Secondary | ICD-10-CM | POA: Diagnosis not present

## 2021-04-28 DIAGNOSIS — G4733 Obstructive sleep apnea (adult) (pediatric): Secondary | ICD-10-CM | POA: Diagnosis not present

## 2021-04-30 LAB — CUP PACEART REMOTE DEVICE CHECK
Date Time Interrogation Session: 20220929000522
Implantable Pulse Generator Implant Date: 20210730

## 2021-05-05 DIAGNOSIS — I10 Essential (primary) hypertension: Secondary | ICD-10-CM | POA: Diagnosis not present

## 2021-05-05 DIAGNOSIS — E7800 Pure hypercholesterolemia, unspecified: Secondary | ICD-10-CM | POA: Diagnosis not present

## 2021-05-05 DIAGNOSIS — K219 Gastro-esophageal reflux disease without esophagitis: Secondary | ICD-10-CM | POA: Diagnosis not present

## 2021-05-05 DIAGNOSIS — F411 Generalized anxiety disorder: Secondary | ICD-10-CM | POA: Diagnosis not present

## 2021-05-05 DIAGNOSIS — Z0000 Encounter for general adult medical examination without abnormal findings: Secondary | ICD-10-CM | POA: Diagnosis not present

## 2021-05-05 DIAGNOSIS — I739 Peripheral vascular disease, unspecified: Secondary | ICD-10-CM | POA: Diagnosis not present

## 2021-05-05 DIAGNOSIS — Z1389 Encounter for screening for other disorder: Secondary | ICD-10-CM | POA: Diagnosis not present

## 2021-05-05 DIAGNOSIS — M797 Fibromyalgia: Secondary | ICD-10-CM | POA: Diagnosis not present

## 2021-05-05 DIAGNOSIS — I679 Cerebrovascular disease, unspecified: Secondary | ICD-10-CM | POA: Diagnosis not present

## 2021-05-06 NOTE — Progress Notes (Signed)
Carelink Summary Report / Loop Recorder 

## 2021-05-14 DIAGNOSIS — G4733 Obstructive sleep apnea (adult) (pediatric): Secondary | ICD-10-CM | POA: Diagnosis not present

## 2021-05-16 ENCOUNTER — Encounter (HOSPITAL_COMMUNITY): Payer: Self-pay

## 2021-05-16 ENCOUNTER — Telehealth (HOSPITAL_COMMUNITY): Payer: Self-pay

## 2021-05-16 NOTE — Telephone Encounter (Signed)
Attempted to call patient in regards to Pulmonary Rehab - LM on VM Mailed letter 

## 2021-05-23 NOTE — Telephone Encounter (Signed)
Returned pt phone call in regards to pulmonary rehab, left message for pt to call back. Letter was sent out on 05/16/21

## 2021-05-26 DIAGNOSIS — J449 Chronic obstructive pulmonary disease, unspecified: Secondary | ICD-10-CM | POA: Diagnosis not present

## 2021-05-27 DIAGNOSIS — R292 Abnormal reflex: Secondary | ICD-10-CM | POA: Diagnosis not present

## 2021-05-27 DIAGNOSIS — M4316 Spondylolisthesis, lumbar region: Secondary | ICD-10-CM | POA: Diagnosis not present

## 2021-05-28 LAB — CUP PACEART REMOTE DEVICE CHECK
Date Time Interrogation Session: 20221102104418
Implantable Pulse Generator Implant Date: 20210730

## 2021-05-29 DIAGNOSIS — R944 Abnormal results of kidney function studies: Secondary | ICD-10-CM | POA: Diagnosis not present

## 2021-05-29 DIAGNOSIS — J449 Chronic obstructive pulmonary disease, unspecified: Secondary | ICD-10-CM | POA: Diagnosis not present

## 2021-05-29 DIAGNOSIS — M810 Age-related osteoporosis without current pathological fracture: Secondary | ICD-10-CM | POA: Diagnosis not present

## 2021-05-29 DIAGNOSIS — E7800 Pure hypercholesterolemia, unspecified: Secondary | ICD-10-CM | POA: Diagnosis not present

## 2021-05-29 DIAGNOSIS — J439 Emphysema, unspecified: Secondary | ICD-10-CM | POA: Diagnosis not present

## 2021-05-29 DIAGNOSIS — G4736 Sleep related hypoventilation in conditions classified elsewhere: Secondary | ICD-10-CM | POA: Diagnosis not present

## 2021-05-29 DIAGNOSIS — G4733 Obstructive sleep apnea (adult) (pediatric): Secondary | ICD-10-CM | POA: Diagnosis not present

## 2021-05-29 DIAGNOSIS — K219 Gastro-esophageal reflux disease without esophagitis: Secondary | ICD-10-CM | POA: Diagnosis not present

## 2021-05-29 DIAGNOSIS — I10 Essential (primary) hypertension: Secondary | ICD-10-CM | POA: Diagnosis not present

## 2021-05-29 DIAGNOSIS — R0902 Hypoxemia: Secondary | ICD-10-CM | POA: Diagnosis not present

## 2021-06-02 ENCOUNTER — Ambulatory Visit (INDEPENDENT_AMBULATORY_CARE_PROVIDER_SITE_OTHER): Payer: Medicare HMO

## 2021-06-02 DIAGNOSIS — I6340 Cerebral infarction due to embolism of unspecified cerebral artery: Secondary | ICD-10-CM | POA: Diagnosis not present

## 2021-06-10 NOTE — Progress Notes (Signed)
Carelink Summary Report / Loop Recorder 

## 2021-06-16 ENCOUNTER — Telehealth (HOSPITAL_COMMUNITY): Payer: Self-pay

## 2021-06-16 NOTE — Telephone Encounter (Signed)
No response from pt in regards to Pulmonary Rehab. Closed referral    

## 2021-07-01 DIAGNOSIS — M810 Age-related osteoporosis without current pathological fracture: Secondary | ICD-10-CM | POA: Diagnosis not present

## 2021-07-01 DIAGNOSIS — I10 Essential (primary) hypertension: Secondary | ICD-10-CM | POA: Diagnosis not present

## 2021-07-01 DIAGNOSIS — E7800 Pure hypercholesterolemia, unspecified: Secondary | ICD-10-CM | POA: Diagnosis not present

## 2021-07-01 DIAGNOSIS — K219 Gastro-esophageal reflux disease without esophagitis: Secondary | ICD-10-CM | POA: Diagnosis not present

## 2021-07-01 DIAGNOSIS — J449 Chronic obstructive pulmonary disease, unspecified: Secondary | ICD-10-CM | POA: Diagnosis not present

## 2021-07-01 DIAGNOSIS — J439 Emphysema, unspecified: Secondary | ICD-10-CM | POA: Diagnosis not present

## 2021-07-03 LAB — CUP PACEART REMOTE DEVICE CHECK
Date Time Interrogation Session: 20221208090025
Implantable Pulse Generator Implant Date: 20210730

## 2021-07-07 ENCOUNTER — Ambulatory Visit (INDEPENDENT_AMBULATORY_CARE_PROVIDER_SITE_OTHER): Payer: Medicare HMO

## 2021-07-07 DIAGNOSIS — I6340 Cerebral infarction due to embolism of unspecified cerebral artery: Secondary | ICD-10-CM | POA: Diagnosis not present

## 2021-07-17 NOTE — Progress Notes (Signed)
Carelink Summary Report / Loop Recorder 

## 2021-08-11 ENCOUNTER — Ambulatory Visit (INDEPENDENT_AMBULATORY_CARE_PROVIDER_SITE_OTHER): Payer: Medicare HMO

## 2021-08-11 DIAGNOSIS — I6340 Cerebral infarction due to embolism of unspecified cerebral artery: Secondary | ICD-10-CM

## 2021-08-12 LAB — CUP PACEART REMOTE DEVICE CHECK
Date Time Interrogation Session: 20230116000948
Implantable Pulse Generator Implant Date: 20210730

## 2021-08-22 NOTE — Progress Notes (Signed)
Carelink Summary Report / Loop Recorder 

## 2021-09-15 ENCOUNTER — Ambulatory Visit (INDEPENDENT_AMBULATORY_CARE_PROVIDER_SITE_OTHER): Payer: Medicare HMO

## 2021-09-15 DIAGNOSIS — I6340 Cerebral infarction due to embolism of unspecified cerebral artery: Secondary | ICD-10-CM | POA: Diagnosis not present

## 2021-09-15 LAB — CUP PACEART REMOTE DEVICE CHECK
Date Time Interrogation Session: 20230218000302
Date Time Interrogation Session: 20230220001403
Implantable Pulse Generator Implant Date: 20210730
Implantable Pulse Generator Implant Date: 20210730

## 2021-09-19 NOTE — Progress Notes (Signed)
Carelink Summary Report / Loop Recorder 

## 2021-10-20 ENCOUNTER — Ambulatory Visit (INDEPENDENT_AMBULATORY_CARE_PROVIDER_SITE_OTHER): Payer: Medicare HMO

## 2021-10-20 DIAGNOSIS — I6340 Cerebral infarction due to embolism of unspecified cerebral artery: Secondary | ICD-10-CM

## 2021-10-22 LAB — CUP PACEART REMOTE DEVICE CHECK
Date Time Interrogation Session: 20230329100052
Implantable Pulse Generator Implant Date: 20210730

## 2021-10-30 NOTE — Progress Notes (Signed)
Carelink Summary Report / Loop Recorder 

## 2021-11-10 DIAGNOSIS — L989 Disorder of the skin and subcutaneous tissue, unspecified: Secondary | ICD-10-CM | POA: Diagnosis not present

## 2021-11-10 DIAGNOSIS — I10 Essential (primary) hypertension: Secondary | ICD-10-CM | POA: Diagnosis not present

## 2021-11-10 DIAGNOSIS — F411 Generalized anxiety disorder: Secondary | ICD-10-CM | POA: Diagnosis not present

## 2021-11-10 DIAGNOSIS — M797 Fibromyalgia: Secondary | ICD-10-CM | POA: Diagnosis not present

## 2021-11-10 DIAGNOSIS — J449 Chronic obstructive pulmonary disease, unspecified: Secondary | ICD-10-CM | POA: Diagnosis not present

## 2021-11-10 DIAGNOSIS — K219 Gastro-esophageal reflux disease without esophagitis: Secondary | ICD-10-CM | POA: Diagnosis not present

## 2021-11-10 DIAGNOSIS — I679 Cerebrovascular disease, unspecified: Secondary | ICD-10-CM | POA: Diagnosis not present

## 2021-11-10 DIAGNOSIS — I739 Peripheral vascular disease, unspecified: Secondary | ICD-10-CM | POA: Diagnosis not present

## 2021-11-10 DIAGNOSIS — E7800 Pure hypercholesterolemia, unspecified: Secondary | ICD-10-CM | POA: Diagnosis not present

## 2021-11-24 ENCOUNTER — Ambulatory Visit (INDEPENDENT_AMBULATORY_CARE_PROVIDER_SITE_OTHER): Payer: Medicare HMO

## 2021-11-24 DIAGNOSIS — I6340 Cerebral infarction due to embolism of unspecified cerebral artery: Secondary | ICD-10-CM

## 2021-11-24 LAB — CUP PACEART REMOTE DEVICE CHECK
Date Time Interrogation Session: 20230429000234
Implantable Pulse Generator Implant Date: 20210730

## 2021-12-02 DIAGNOSIS — R944 Abnormal results of kidney function studies: Secondary | ICD-10-CM | POA: Diagnosis not present

## 2021-12-08 NOTE — Progress Notes (Signed)
Carelink Summary Report / Loop Recorder 

## 2021-12-17 ENCOUNTER — Telehealth: Payer: Self-pay

## 2021-12-17 NOTE — Telephone Encounter (Signed)
Per Dr. Roslyn Smiling Pt to afib clinic for [facility] initiation.  Call placed to Pt.  Advised afib found on her loop recorder.  Scheduled with afib clinic for 12/19/21 at 11:30 am.  Directions and parking garage code given.  All questions answered.

## 2021-12-17 NOTE — Telephone Encounter (Signed)
Pt implanted for cryptogenic stroke.  ILR alert summary report received. Battery status OK. Normal device function. No new symptom, tachy, brady, or pause episodes. 7 new AF episodes. Longest duration 4hrs 57min, mean HR 64, burden 0.2%. DAPT, no OAC per Epic  Will forward to implanting physician for review.  Possible atrial fibrillation.

## 2021-12-19 ENCOUNTER — Ambulatory Visit (HOSPITAL_COMMUNITY)
Admission: RE | Admit: 2021-12-19 | Discharge: 2021-12-19 | Disposition: A | Discharge status: Home / Self Care | Payer: Medicare HMO | Source: Ambulatory Visit | Attending: Physician Assistant | Admitting: Physician Assistant

## 2021-12-19 ENCOUNTER — Encounter (HOSPITAL_COMMUNITY): Payer: Self-pay | Admitting: Physician Assistant

## 2021-12-19 VITALS — BP 112/70 | HR 64 | Ht 59.0 in | Wt 205.4 lb

## 2021-12-19 DIAGNOSIS — I739 Peripheral vascular disease, unspecified: Secondary | ICD-10-CM | POA: Diagnosis not present

## 2021-12-19 DIAGNOSIS — E785 Hyperlipidemia, unspecified: Secondary | ICD-10-CM | POA: Insufficient documentation

## 2021-12-19 DIAGNOSIS — I10 Essential (primary) hypertension: Secondary | ICD-10-CM | POA: Insufficient documentation

## 2021-12-19 DIAGNOSIS — I2510 Atherosclerotic heart disease of native coronary artery without angina pectoris: Secondary | ICD-10-CM | POA: Diagnosis not present

## 2021-12-19 DIAGNOSIS — I480 Paroxysmal atrial fibrillation: Secondary | ICD-10-CM | POA: Insufficient documentation

## 2021-12-19 DIAGNOSIS — D6869 Other thrombophilia: Secondary | ICD-10-CM | POA: Insufficient documentation

## 2021-12-19 DIAGNOSIS — Z79899 Other long term (current) drug therapy: Secondary | ICD-10-CM | POA: Diagnosis not present

## 2021-12-19 DIAGNOSIS — G4733 Obstructive sleep apnea (adult) (pediatric): Secondary | ICD-10-CM | POA: Insufficient documentation

## 2021-12-19 DIAGNOSIS — Z7982 Long term (current) use of aspirin: Secondary | ICD-10-CM | POA: Diagnosis not present

## 2021-12-19 DIAGNOSIS — J449 Chronic obstructive pulmonary disease, unspecified: Secondary | ICD-10-CM | POA: Diagnosis not present

## 2021-12-19 DIAGNOSIS — Z8673 Personal history of transient ischemic attack (TIA), and cerebral infarction without residual deficits: Secondary | ICD-10-CM | POA: Insufficient documentation

## 2021-12-19 DIAGNOSIS — Z6841 Body Mass Index (BMI) 40.0 and over, adult: Secondary | ICD-10-CM | POA: Diagnosis not present

## 2021-12-19 DIAGNOSIS — Z9989 Dependence on other enabling machines and devices: Secondary | ICD-10-CM | POA: Insufficient documentation

## 2021-12-19 DIAGNOSIS — E669 Obesity, unspecified: Secondary | ICD-10-CM | POA: Insufficient documentation

## 2021-12-19 DIAGNOSIS — E7800 Pure hypercholesterolemia, unspecified: Secondary | ICD-10-CM | POA: Diagnosis not present

## 2021-12-19 DIAGNOSIS — K219 Gastro-esophageal reflux disease without esophagitis: Secondary | ICD-10-CM | POA: Diagnosis not present

## 2021-12-19 DIAGNOSIS — Z7901 Long term (current) use of anticoagulants: Secondary | ICD-10-CM | POA: Insufficient documentation

## 2021-12-19 MED ORDER — APIXABAN 5 MG PO TABS: 5.0000 mg | ORAL_TABLET | Freq: Two times a day (BID) | ORAL | 3 refills | Status: DC

## 2021-12-19 NOTE — Progress Notes (Addendum)
Primary Care Physician: Carol Ada, MD Primary Cardiologist: Dr Oval Linsey (remotely) Primary Electrophysiologist: Dr Curt Bears Referring Physician: Dr Lauree Chandler Norma Scott is a 71 y.o. female with a history of HTN, OSA, HLD, PVD, TIA, CVA, COPD, CAD, atrial fibrillation who presents for consultation in the [facility].  The patient was initially diagnosed with atrial fibrillation on ILR which was placed for cyrptogenic stroke in 2021. The device clinic received an alert on 12/17/21 for 7 afib episodes, longest 4.5 hours. She was asymptomatic during those episodes. Patient has a CHADS2VASC score of 6. She reports good compliance with CPAP therapy.   Today, she denies symptoms of palpitations, chest pain, shortness of breath, orthopnea, PND, dizziness, presyncope, syncope, bleeding, or neurologic sequela. The patient is tolerating medications without difficulties and is otherwise without complaint today.    Atrial Fibrillation Risk Factors:  she does have symptoms or diagnosis of sleep apnea. she is compliant with CPAP therapy. she does not have a history of rheumatic fever.   she has a BMI of Body mass index is 41.49 kg/m.Marland Kitchen Filed Weights   12/19/21 1154  Weight: 93.2 kg    Family History  Problem Relation Age of Onset   Stroke Mother    Hypertension Mother    Hyperlipidemia Mother    Seizures Mother        Grand mal seizures   Emphysema Mother    Diabetes Father    Heart disease Father        Heart Disease before age 35   Breast cancer Maternal Aunt    Heart disease Paternal Uncle    Heart attack Paternal Uncle    Stroke Maternal Grandmother    Heart Problems Maternal Grandmother    Cancer Maternal Grandfather        stomach cancer   Heart attack Maternal Grandfather    Cancer Paternal Grandmother 61   Heart disease Paternal Grandfather    Heart attack Paternal Grandfather    Deep vein thrombosis Brother    Asthma Brother       Atrial Fibrillation Management history:  Previous antiarrhythmic drugs: none Previous cardioversions: none Previous ablations: none CHADS2VASC score: 6 Anticoagulation history: none   Past Medical History:  Diagnosis Date   Allergy    allergic rhinitis   Arteriosclerotic coronary artery disease 05/21/2015   Asthma    CAD (coronary artery disease)    patient says no problems   dr Jeremy Johann pcp   Carotid artery occlusion    Cervical disc displacement    x2   COPD (chronic obstructive pulmonary disease) (HCC)    Dyspnea    Esophageal reflux    Essential hypertension 05/21/2015   Family history of adverse reaction to anesthesia    mother hard to wake and affected b/p   Fibromyalgia    Gall stones    Generalized anxiety disorder    Headache    Hyperlipidemia    Lumbar disc herniation    L5-S1   Mini stroke    Peripheral vascular disease ([place])    Sleep apnea    Stroke [facility])    July 2021   TIA (transient ischemic attack) 05/21/2015   Past Surgical History:  Procedure Laterality Date   ABDOMINAL AORTOGRAM W/LOWER EXTREMITY N/A 01/15/2021   Procedure: ABDOMINAL AORTOGRAM W/LOWER EXTREMITY;  Surgeon: Cherre Robins, MD;  Location: [place] [facility];  Service: Cardiovascular;  Laterality: N/A;   APPLICATION OF WOUND VAC Right 02/14/2021  Procedure: APPLICATION OF WOUND VAC;  Surgeon: Cherre Robins, MD;  Location: [place];  Service: Vascular;  Laterality: Right;   BACK SURGERY  06/01/2012   lumbar   CAROTID ENDARTERECTOMY  02/01/2009   Right   CERVICAL FUSION     1995, 1996   CHOLECYSTECTOMY     CHOLECYSTECTOMY     ECTOPIC PREGNANCY SURGERY  93   ENDARTERECTOMY FEMORAL Right 02/14/2021   Procedure: RIGHT FEMORAL EXTENDED ENDARTERECTOMY WITH VEIN PATCH ANGIOPLASTY USING GREATER SAPHENOUS VEIN HARVEST AND PROFUNDOPLASTY;  Surgeon: Cherre Robins, MD;  [facility];  Service: Vascular;  Laterality: Right;   ERCP N/A 11/20/2015   Procedure: ENDOSCOPIC  RETROGRADE CHOLANGIOPANCREATOGRAPHY (ERCP);  Surgeon: Arta Silence, MD;  Location: [facility];  Service: Endoscopy;  Laterality: N/A;   EUS N/A 11/20/2015   Procedure: UPPER ENDOSCOPIC ULTRASOUND (EUS) RADIAL;  Surgeon: Arta Silence, MD;  Location: [facility];  Service: Endoscopy;  Laterality: N/A;   INSERTION OF ILIAC STENT Right 02/14/2021   Procedure: INSERTION OF RIGHT ILIAC ARTERY STENT;  Surgeon: Cherre Robins, MD;  Location: [place];  Service: Vascular;  Laterality: Right;   LOOP RECORDER INSERTION N/A 02/23/2020   Procedure: LOOP RECORDER INSERTION;  Surgeon: Constance Haw, MD;  Location: [place] [facility];  Service: Cardiovascular;  Laterality: N/A;   TUBAL LIGATION      Current Outpatient Medications  Medication Sig Dispense Refill   albuterol (PROVENTIL HFA) 108 (90 Base) MCG/ACT inhaler Inhale 1 puff into the lungs every 6 (six) hours as needed for wheezing or shortness of breath. 1 Inhaler 0   amLODipine (NORVASC) 10 MG tablet Take 10 mg by mouth daily.     apixaban (ELIQUIS) 5 MG TABS tablet Take 1 tablet (5 mg total) by mouth 2 (two) times daily. 60 tablet 3   aspirin EC 81 MG EC tablet Take 1 tablet (81 mg total) by mouth daily. Swallow whole. 30 tablet 11   atorvastatin (LIPITOR) 40 MG tablet Take 40 mg by mouth at bedtime.      clonazePAM (KLONOPIN) 0.5 MG tablet Take 0.5 mg by mouth 3 (three) times daily as needed for anxiety.     diphenhydrAMINE HCl (ZZZQUIL) 50 MG/30ML LIQD Take 50 mg by mouth at bedtime as needed (sleep).     FLUoxetine (PROZAC) 20 MG capsule Take 60 mg by mouth daily.      furosemide (LASIX) 20 MG tablet Take 20 mg by mouth daily.     gabapentin (NEURONTIN) 300 MG capsule Take 300-600 mg by mouth See admin instructions. Take 300 mg in the morning, 300 mg at noon, and 600 mg at bedtime     ipratropium (ATROVENT) 0.03 % nasal spray Place 2 sprays into both nostrils every 12 (twelve) hours. 30 mL 12   lisinopril (ZESTRIL) 30 MG tablet Take 30  mg by mouth daily.      loratadine (CLARITIN) 10 MG tablet Take 10 mg by mouth daily.     metoprolol tartrate (LOPRESSOR) 25 MG tablet Take 25 mg by mouth 2 (two) times daily.     olopatadine (PATANOL) 0.1 % ophthalmic solution Place 1 drop into both eyes daily as needed (dry/itchy eyes).     tiZANidine (ZANAFLEX) 4 MG tablet Take 4 mg by mouth every 8 (eight) hours as needed for muscle spasms.     TRELEGY ELLIPTA 100-62.5-25 MCG/INH AEPB Inhale 1 puff into the lungs daily.     No current facility-administered medications for this encounter.  Allergies  Allergen Reactions   Bupropion Other (See Comments)    Hallucinations    Septra [Sulfamethoxazole-Trimethoprim] Nausea And Vomiting   Sulfa Antibiotics Nausea And Vomiting   Zithromax [Azithromycin] Hives   Crestor [Rosuvastatin]     Other reaction(s): elevated liver functions   Cymbalta [Duloxetine Hcl] Other (See Comments)    fatigue   Zoloft [Sertraline]     Other reaction(s): fatigue    Social History   Socioeconomic History   Marital status: Divorced    Spouse name: Not on file   Number of children: Not on file   Years of education: Not on file   Highest education level: Not on file  Occupational History   Not on file  Tobacco Use   Smoking status: Former    Packs/day: 0.50    Years: 45.00    Pack years: 22.50    Types: Cigarettes    Quit date: 11/18/2020    Years since quitting: 1.0   Smokeless tobacco: Never   Tobacco comments:    Former smoker 12/19/21  Vaping Use   Vaping Use: Never used  Substance and Sexual Activity   Alcohol use: Not Currently    Comment: 2 times a year   Drug use: No   Sexual activity: Not on file  Other Topics Concern   Not on file  Social History Narrative   Epworth Sleepiness Scale = 10 (as of 05/21/2015)   Social Determinants of Health   Financial Resource Strain: Not on file  Food Insecurity: Not on file  Transportation Needs: Not on file  Physical Activity: Not on  file  Stress: Not on file  Social Connections: Not on file  Intimate Partner Violence: Not on file     ROS- All systems are reviewed and negative except as per the HPI above.  Physical Exam: Vitals:   12/19/21 1154  BP: 112/70  Pulse: 64  Weight: 93.2 kg  Height: 4\' 11"  (1.499 m)    GEN- The patient is a well appearing obese female, alert and oriented x 3 today.   Head- normocephalic, atraumatic Eyes-  Sclera clear, conjunctiva pink Ears- hearing intact Oropharynx- clear Neck- supple  Lungs- Clear to ausculation bilaterally, normal work of breathing Heart- Regular rate and rhythm, no murmurs, rubs or gallops  GI- soft, NT, ND, + BS Extremities- no clubbing, cyanosis, 1+ bilateral edema MS- no significant deformity or atrophy Skin- no rash or lesion Psych- euthymic mood, full affect Neuro- strength and sensation are intact  Wt Readings from Last 3 Encounters:  12/19/21 93.2 kg  04/01/21 86.6 kg  02/18/21 90.4 kg    EKG today demonstrates  SR Vent. rate 64 BPM PR interval 164 ms QRS duration 80 ms QT/QTcB 406/418 ms  Echo 02/22/20 demonstrated  1. Left ventricular ejection fraction, by estimation, is 60 to 65%. The  left ventricle has normal function. The left ventricle has no regional  wall motion abnormalities. There is moderate left ventricular hypertrophy. Left ventricular diastolic parameters are indeterminate.   2. Right ventricular systolic function is normal. The right ventricular  size is normal. Tricuspid regurgitation signal is inadequate for assessing PA pressure.   3. The mitral valve is grossly normal. No evidence of mitral valve  regurgitation.   4. The aortic valve was not well visualized. Aortic valve regurgitation  is not visualized. Mild to moderate aortic valve sclerosis/calcification  is present, without any evidence of aortic stenosis.   5. The inferior vena cava is normal in size with  greater than 50%  respiratory variability, suggesting  right atrial pressure of 3 mmHg.   Epic records are reviewed at length today  CHA2DS2-VASc Score = 6  The patient's score is based upon: CHF History: 0 HTN History: 1 Diabetes History: 0 Stroke History: 2 Vascular Disease History: 1 Age Score: 1 Gender Score: 1       ASSESSMENT AND PLAN: 1. Paroxysmal Atrial Fibrillation (ICD10:  I48.0) The patient's CHA2DS2-VASc score is 6, indicating a 9.7% annual risk of stroke.   General education about afib provided and questions answered. We also discussed her stroke risk and the risks and benefits of anticoagulation. Will start Eliquis 5 mg BID. Continue ASA given significant PVD. Continue Lopressor 25 mg BID Continue to monitor afib burden on ILR. Will request recent lab work from PCP.  2. Secondary Hypercoagulable State (ICD10:  D68.69) The patient is at significant risk for stroke/thromboembolism based upon her CHA2DS2-VASc Score of 6.  Start Apixaban (Eliquis).   3. Obesity Body mass index is 41.49 kg/m. Lifestyle modification was discussed at length including regular exercise and weight reduction.  4. HTN Stable, no changes today.  5. CAD Coronary calcification on CT On statin No anginal symptoms.  6. PVD S/p R CEA S/p R femoral endarterectomy and R common iliac artery stenting. Followed by Dr Stanford Breed.  7. OSA The importance of adequate treatment of sleep apnea was discussed today in order to improve our ability to maintain sinus rhythm long term. Patient reports compliance with CPAP.   Follow up in the AF clinic on one month. Will also request follow up with Dr Oval Linsey to reestablish care.    [facility] [address] [place], [place] [address] [phone] 12/19/2021 12:25 PM

## 2021-12-19 NOTE — Patient Instructions (Signed)
Start Eliquis 5mg twice a day 

## 2021-12-25 DIAGNOSIS — I10 Essential (primary) hypertension: Secondary | ICD-10-CM | POA: Diagnosis not present

## 2021-12-25 DIAGNOSIS — J449 Chronic obstructive pulmonary disease, unspecified: Secondary | ICD-10-CM | POA: Diagnosis not present

## 2021-12-25 DIAGNOSIS — E7800 Pure hypercholesterolemia, unspecified: Secondary | ICD-10-CM | POA: Diagnosis not present

## 2021-12-29 ENCOUNTER — Ambulatory Visit (INDEPENDENT_AMBULATORY_CARE_PROVIDER_SITE_OTHER): Payer: Medicare HMO

## 2021-12-29 DIAGNOSIS — I6340 Cerebral infarction due to embolism of unspecified cerebral artery: Secondary | ICD-10-CM | POA: Diagnosis not present

## 2021-12-30 LAB — CUP PACEART REMOTE DEVICE CHECK
Date Time Interrogation Session: 20230601000939
Implantable Pulse Generator Implant Date: 20210730

## 2022-01-14 NOTE — Progress Notes (Signed)
Carelink Summary Report / Loop Recorder 

## 2022-01-15 ENCOUNTER — Encounter: Payer: Self-pay | Admitting: Pulmonary Disease

## 2022-01-15 ENCOUNTER — Ambulatory Visit: Payer: Medicare HMO | Admitting: Pulmonary Disease

## 2022-01-15 VITALS — BP 124/80 | HR 59 | Ht 59.0 in | Wt 196.2 lb

## 2022-01-15 DIAGNOSIS — J449 Chronic obstructive pulmonary disease, unspecified: Secondary | ICD-10-CM | POA: Diagnosis not present

## 2022-01-15 DIAGNOSIS — G4733 Obstructive sleep apnea (adult) (pediatric): Secondary | ICD-10-CM

## 2022-01-15 NOTE — Patient Instructions (Addendum)
Continue fluticasone nasal spray, 2 sprays per nostril daily   Continue ipratropium nasal spray, 2 sprays per nostril twice daily as needed or nasal drainage   Continue trelegy ellipta daily and as needed albuterol    We will schedule you for pulmonary function tests  We will get a download of your CPAP use  We will reach out to our lung cancer screening team to get you setup for your next scan  Follow up in 1 year. Call sooner if you have changes in your breathing.

## 2022-01-16 ENCOUNTER — Other Ambulatory Visit: Payer: Self-pay | Admitting: *Deleted

## 2022-01-16 DIAGNOSIS — Z122 Encounter for screening for malignant neoplasm of respiratory organs: Secondary | ICD-10-CM

## 2022-01-16 DIAGNOSIS — Z87891 Personal history of nicotine dependence: Secondary | ICD-10-CM

## 2022-01-22 ENCOUNTER — Encounter: Payer: Self-pay | Admitting: Pulmonary Disease

## 2022-01-22 ENCOUNTER — Ambulatory Visit: Payer: Medicare HMO | Admitting: Pulmonary Disease

## 2022-01-22 VITALS — BP 128/60 | HR 57 | Temp 98.0°F | Ht 59.0 in | Wt 201.1 lb

## 2022-01-22 DIAGNOSIS — Z9989 Dependence on other enabling machines and devices: Secondary | ICD-10-CM | POA: Diagnosis not present

## 2022-01-22 DIAGNOSIS — G4733 Obstructive sleep apnea (adult) (pediatric): Secondary | ICD-10-CM | POA: Diagnosis not present

## 2022-01-22 NOTE — Progress Notes (Signed)
Norma Scott    [id number]    1951-05-04  Primary Care Physician:Smith, Hal Hope, MD  Referring Physician: Carol Ada, [place],  [place] [address]  Chief complaint:   Patient being seen for follow-up of obstructive sleep apnea  HPI:  Severe obstructive sleep apnea diagnosed in June 2022 Has been on CPAP CPAP of 13 with 2 L of oxygen packed into the system She does not like using CPAP but she manages to use it every night  She does have a history of coronary artery disease, GERD, stroke, chronic obstructive pulmonary disease  Continues on Trelegy, albuterol use as needed  She is compliant with CPAP use and tolerating it okay  She does report sleeping well at night waking up mostly okay  Sometimes tired during the day  She is an active smoker, has cut down significantly  She is trying to stay active but she is limited by chronic back pain  Outpatient Encounter Medications as of 01/22/2022  Medication Sig   albuterol (PROVENTIL HFA) 108 (90 Base) MCG/ACT inhaler Inhale 1 puff into the lungs every 6 (six) hours as needed for wheezing or shortness of breath.   amLODipine (NORVASC) 10 MG tablet Take 10 mg by mouth daily.   apixaban (ELIQUIS) 5 MG TABS tablet Take 1 tablet (5 mg total) by mouth 2 (two) times daily.   aspirin EC 81 MG EC tablet Take 1 tablet (81 mg total) by mouth daily. Swallow whole.   atorvastatin (LIPITOR) 40 MG tablet Take 40 mg by mouth at bedtime.    clonazePAM (KLONOPIN) 0.5 MG tablet Take 0.5 mg by mouth 3 (three) times daily as needed for anxiety.   diphenhydrAMINE HCl (ZZZQUIL) 50 MG/30ML LIQD Take 50 mg by mouth at bedtime as needed (sleep).   FLUoxetine (PROZAC) 20 MG capsule Take 60 mg by mouth daily.    furosemide (LASIX) 20 MG tablet Take 20 mg by mouth daily.   gabapentin (NEURONTIN) 300 MG capsule Take 300-600 mg by mouth See admin instructions. Take 300 mg in the morning, 300 mg at noon, and  600 mg at bedtime   ipratropium (ATROVENT) 0.03 % nasal spray Place 2 sprays into both nostrils every 12 (twelve) hours.   lisinopril (ZESTRIL) 30 MG tablet Take 30 mg by mouth daily.    loratadine (CLARITIN) 10 MG tablet Take 10 mg by mouth daily.   metoprolol tartrate (LOPRESSOR) 25 MG tablet Take 25 mg by mouth 2 (two) times daily.   olopatadine (PATANOL) 0.1 % ophthalmic solution Place 1 drop into both eyes daily as needed (dry/itchy eyes).   tiZANidine (ZANAFLEX) 4 MG tablet Take 4 mg by mouth every 8 (eight) hours as needed for muscle spasms.   TRELEGY ELLIPTA 100-62.5-25 MCG/INH AEPB Inhale 1 puff into the lungs daily.   [DISCONTINUED] fluticasone (FLONASE) 50 MCG/ACT nasal spray Place 2 sprays into both nostrils daily. (Patient not taking: No sig reported)   No facility-administered encounter medications on file as of 01/22/2022.    Allergies as of 01/22/2022 - Review Complete 01/22/2022  Allergen Reaction Noted   Bupropion Other (See Comments) 01/11/2012   Septra [sulfamethoxazole-trimethoprim] Nausea And Vomiting 01/11/2012   Sulfa antibiotics Nausea And Vomiting 01/11/2012   Zithromax [azithromycin] Hives 01/11/2012   Crestor [rosuvastatin]  11/10/2021   Cymbalta [duloxetine hcl] Other (See Comments) 01/11/2012   Zoloft [sertraline]  11/10/2021    Past Medical History:  Diagnosis Date   Allergy  allergic rhinitis   Arteriosclerotic coronary artery disease 05/21/2015   Asthma    CAD (coronary artery disease)    patient says no problems   dr Severiano Gilbert pcp   Carotid artery occlusion    Cervical disc displacement    x2   COPD (chronic obstructive pulmonary disease) (HCC)    Dyspnea    Esophageal reflux    Essential hypertension 05/21/2015   Family history of adverse reaction to anesthesia    mother hard to wake and affected b/p   Fibromyalgia    Gall stones    Generalized anxiety disorder    Headache    Hyperlipidemia    Lumbar disc herniation    L5-S1    Mini stroke    Peripheral vascular disease (HCC)    Sleep apnea    Stroke [facility])    July 2021   TIA (transient ischemic attack) 05/21/2015    Past Surgical History:  Procedure Laterality Date   ABDOMINAL AORTOGRAM W/LOWER EXTREMITY N/A 01/15/2021   Procedure: ABDOMINAL AORTOGRAM W/LOWER EXTREMITY;  Surgeon: Leonie Douglas, MD;  [facility];  Service: Cardiovascular;  Laterality: N/A;   APPLICATION OF WOUND VAC Right 02/14/2021   Procedure: APPLICATION OF WOUND VAC;  Surgeon: Leonie Douglas, MD;  [facility];  Service: Vascular;  Laterality: Right;   BACK SURGERY  06/01/2012   lumbar   CAROTID ENDARTERECTOMY  02/01/2009   Right   CERVICAL FUSION     1995, 1996   CHOLECYSTECTOMY     CHOLECYSTECTOMY     ECTOPIC PREGNANCY SURGERY  93   ENDARTERECTOMY FEMORAL Right 02/14/2021   Procedure: RIGHT FEMORAL EXTENDED ENDARTERECTOMY WITH VEIN PATCH ANGIOPLASTY USING GREATER SAPHENOUS VEIN HARVEST AND PROFUNDOPLASTY;  Surgeon: Leonie Douglas, MD;  [facility];  Service: Vascular;  Laterality: Right;   ERCP N/A 11/20/2015   Procedure: ENDOSCOPIC RETROGRADE CHOLANGIOPANCREATOGRAPHY (ERCP);  Surgeon: Willis Modena, MD;  Location: [facility];  Service: Endoscopy;  Laterality: N/A;   EUS N/A 11/20/2015   Procedure: UPPER ENDOSCOPIC ULTRASOUND (EUS) RADIAL;  Surgeon: Willis Modena, MD;  [facility]: [facility];  Service: Endoscopy;  Laterality: N/A;   INSERTION OF ILIAC STENT Right 02/14/2021   Procedure: INSERTION OF RIGHT ILIAC ARTERY STENT;  Surgeon: Leonie Douglas, MD;  [facility];  Service: Vascular;  Laterality: Right;   LOOP RECORDER INSERTION N/A 02/23/2020   Procedure: LOOP RECORDER INSERTION;  Surgeon: Regan Lemming, MD;  [facility];  Service: Cardiovascular;  Laterality: N/A;   TUBAL LIGATION      Family History  Problem Relation Age of Onset   Stroke Mother    Hypertension Mother    Hyperlipidemia Mother    Seizures Mother         Grand mal seizures   Emphysema Mother    Diabetes Father    Heart disease Father        Heart Disease before age 72   Breast cancer Maternal Aunt    Heart disease Paternal Uncle    Heart attack Paternal Uncle    Stroke Maternal Grandmother    Heart Problems Maternal Grandmother    Cancer Maternal Grandfather        stomach cancer   Heart attack Maternal Grandfather    Cancer Paternal Grandmother 49   Heart disease Paternal Grandfather    Heart attack Paternal Grandfather    Deep vein thrombosis Brother    Asthma Brother     Social History  Socioeconomic History   Marital status: Divorced    Spouse name: Not on file   Number of children: Not on file   Years of education: Not on file   Highest education level: Not on file  Occupational History   Not on file  Tobacco Use   Smoking status: Former    Packs/day: 0.50    Years: 45.00    Total pack years: 22.50    Types: Cigarettes    Quit date: 11/18/2020    Years since quitting: 1.1   Smokeless tobacco: Never   Tobacco comments:    Former smoker 12/19/21  Vaping Use   Vaping Use: Never used  Substance and Sexual Activity   Alcohol use: Not Currently    Comment: 2 times a year   Drug use: No   Sexual activity: Not on file  Other Topics Concern   Not on file  Social History Narrative   Epworth Sleepiness Scale = 10 (as of 05/21/2015)   Social Determinants of Health   Financial Resource Strain: Not on file  Food Insecurity: Not on file  Transportation Needs: Not on file  Physical Activity: Not on file  Stress: Not on file  Social Connections: Not on file  Intimate Partner Violence: Not on file    Review of Systems  Respiratory:  Positive for apnea.   Psychiatric/Behavioral:  Positive for sleep disturbance.     Vitals:   01/22/22 1623  BP: 128/60  Pulse: (!) 57  Temp: 98 F (36.7 C)  SpO2: 95%     Physical Exam Constitutional:      Appearance: She is obese.  HENT:     Head:  Normocephalic.     Mouth/Throat:     Mouth: Mucous membranes are moist.  Cardiovascular:     Rate and Rhythm: Normal rate and regular rhythm.     Heart sounds: No murmur heard.    No friction rub.  Pulmonary:     Effort: No respiratory distress.     Breath sounds: No stridor. No wheezing or rhonchi.     Comments: Decreased air movement bilaterally Musculoskeletal:     Cervical back: No rigidity or tenderness.  Neurological:     Mental Status: She is alert.  Psychiatric:        Mood and Affect: Mood normal.    Compliance data reviewed  Data Reviewed: Compliance data shows 80% compliance Average use of 5 hours 40 minutes Set pressure of 13 No significant leaks Residual AHI of 2.6  Assessment:  Severe obstructive sleep apnea -Adequately treated with CPAP therapy  Chronic obstructive pulmonary disease -On Trelegy  Shortness of breath on exertion  Class III obesity -Encouraged to continue working on weight loss  Plan/Recommendations: Continue CPAP at 13  Encouraged to use CPAP nightly  Encouraged to continue bronchodilator treatments  Graded activities as tolerated  Weight loss efforts as tolerated   Virl Diamond MD [facility] 01/22/2022, 4:55 PM  CC: Merri Brunette, MD

## 2022-01-22 NOTE — Patient Instructions (Signed)
I will see you in 6 months  Continue using your CPAP on a nightly basis  Weight management as able  Graded activities as tolerated  Call us with significant concerns

## 2022-01-23 ENCOUNTER — Ambulatory Visit (HOSPITAL_COMMUNITY)
Admission: RE | Admit: 2022-01-23 | Discharge: 2022-01-23 | Disposition: A | Discharge status: Home / Self Care | Payer: Medicare HMO | Source: Ambulatory Visit | Attending: Physician Assistant | Admitting: Physician Assistant

## 2022-01-23 VITALS — BP 126/60 | HR 53 | Ht 59.0 in | Wt 202.4 lb

## 2022-01-23 DIAGNOSIS — Z8673 Personal history of transient ischemic attack (TIA), and cerebral infarction without residual deficits: Secondary | ICD-10-CM | POA: Diagnosis not present

## 2022-01-23 DIAGNOSIS — E669 Obesity, unspecified: Secondary | ICD-10-CM | POA: Insufficient documentation

## 2022-01-23 DIAGNOSIS — I480 Paroxysmal atrial fibrillation: Secondary | ICD-10-CM | POA: Diagnosis not present

## 2022-01-23 DIAGNOSIS — G4733 Obstructive sleep apnea (adult) (pediatric): Secondary | ICD-10-CM | POA: Insufficient documentation

## 2022-01-23 DIAGNOSIS — D6869 Other thrombophilia: Secondary | ICD-10-CM | POA: Diagnosis not present

## 2022-01-23 DIAGNOSIS — I10 Essential (primary) hypertension: Secondary | ICD-10-CM | POA: Insufficient documentation

## 2022-01-23 DIAGNOSIS — Z79899 Other long term (current) drug therapy: Secondary | ICD-10-CM | POA: Diagnosis not present

## 2022-01-23 DIAGNOSIS — J449 Chronic obstructive pulmonary disease, unspecified: Secondary | ICD-10-CM | POA: Insufficient documentation

## 2022-01-23 DIAGNOSIS — I739 Peripheral vascular disease, unspecified: Secondary | ICD-10-CM | POA: Diagnosis not present

## 2022-01-23 DIAGNOSIS — Z7901 Long term (current) use of anticoagulants: Secondary | ICD-10-CM | POA: Diagnosis not present

## 2022-01-23 DIAGNOSIS — Z7902 Long term (current) use of antithrombotics/antiplatelets: Secondary | ICD-10-CM | POA: Insufficient documentation

## 2022-01-23 DIAGNOSIS — Z6841 Body Mass Index (BMI) 40.0 and over, adult: Secondary | ICD-10-CM | POA: Insufficient documentation

## 2022-01-23 DIAGNOSIS — I2510 Atherosclerotic heart disease of native coronary artery without angina pectoris: Secondary | ICD-10-CM | POA: Diagnosis not present

## 2022-01-23 DIAGNOSIS — E785 Hyperlipidemia, unspecified: Secondary | ICD-10-CM | POA: Insufficient documentation

## 2022-01-23 LAB — CBC
HCT: 40.7 % (ref 36.0–46.0)
Hemoglobin: 12.9 g/dL (ref 12.0–15.0)
MCH: 27.2 pg (ref 26.0–34.0)
MCHC: 31.7 g/dL (ref 30.0–36.0)
MCV: 85.9 fL (ref 80.0–100.0)
Platelets: 234 10*3/uL (ref 150–400)
RBC: 4.74 MIL/uL (ref 3.87–5.11)
RDW: 13.2 % (ref 11.5–15.5)
WBC: 7.9 10*3/uL (ref 4.0–10.5)
nRBC: 0 % (ref 0.0–0.2)

## 2022-01-23 NOTE — Progress Notes (Signed)
Primary Care Physician: Carol Ada, MD Primary Cardiologist: Dr Oval Linsey  Primary Electrophysiologist: Dr Curt Bears Referring Physician: Dr Lauree Chandler Norma Scott is a 71 y.o. female with a history of HTN, OSA, HLD, PVD, TIA, CVA, COPD, CAD, atrial fibrillation who presents for follow up in the [facility].  The patient was initially diagnosed with atrial fibrillation on ILR which was placed for cyrptogenic stroke in 2021. The device clinic received an alert on 12/17/21 for 7 afib episodes, longest 4.5 hours. She was asymptomatic during those episodes. Patient has a CHADS2VASC score of 6. She reports good compliance with CPAP therapy.   On follow up today, patient reports that she has done well from an afib standpoint. ILR shows one very brief episode of afib since her last visit. She is tolerating anticoagulation without difficulty.   Today, she denies symptoms of palpitations, chest pain, shortness of breath, orthopnea, PND, dizziness, presyncope, syncope, bleeding, or neurologic sequela. The patient is tolerating medications without difficulties and is otherwise without complaint today.    Atrial Fibrillation Risk Factors:  she does have symptoms or diagnosis of sleep apnea. she is compliant with CPAP therapy. she does not have a history of rheumatic fever.   she has a BMI of Body mass index is 40.88 kg/m.Marland Kitchen Filed Weights   01/23/22 1140  Weight: 91.8 kg     Family History  Problem Relation Age of Onset   Stroke Mother    Hypertension Mother    Hyperlipidemia Mother    Seizures Mother        Grand mal seizures   Emphysema Mother    Diabetes Father    Heart disease Father        Heart Disease before age 31   Breast cancer Maternal Aunt    Heart disease Paternal Uncle    Heart attack Paternal Uncle    Stroke Maternal Grandmother    Heart Problems Maternal Grandmother    Cancer Maternal Grandfather        stomach cancer   Heart  attack Maternal Grandfather    Cancer Paternal Grandmother 67   Heart disease Paternal Grandfather    Heart attack Paternal Grandfather    Deep vein thrombosis Brother    Asthma Brother      Atrial Fibrillation Management history:  Previous antiarrhythmic drugs: none Previous cardioversions: none Previous ablations: none CHADS2VASC score: 6 Anticoagulation history: Eliquis   Past Medical History:  Diagnosis Date   Allergy    allergic rhinitis   Arteriosclerotic coronary artery disease 05/21/2015   Asthma    CAD (coronary artery disease)    patient says no problems   dr Jeremy Johann pcp   Carotid artery occlusion    Cervical disc displacement    x2   COPD (chronic obstructive pulmonary disease) ([place])    Dyspnea    Esophageal reflux    Essential hypertension 05/21/2015   Family history of adverse reaction to anesthesia    mother hard to wake and affected b/p   Fibromyalgia    Gall stones    Generalized anxiety disorder    Headache    Hyperlipidemia    Lumbar disc herniation    L5-S1   Mini stroke    Peripheral vascular disease ([place])    Sleep apnea    Stroke [facility])    July 2021   TIA (transient ischemic attack) 05/21/2015   Past Surgical History:  Procedure Laterality Date   ABDOMINAL AORTOGRAM W/LOWER EXTREMITY  N/A 01/15/2021   Procedure: ABDOMINAL AORTOGRAM W/LOWER EXTREMITY;  Surgeon: Leonie Douglas, MD;  Location[facility] INVASIVE CV LAB;  Service: Cardiovascular;  Laterality: N/A;   APPLICATION OF WOUND VAC Right 02/14/2021   Procedure: APPLICATION OF WOUND VAC;  Surgeon: Leonie Douglas, MD;  Location: [facility] OR;  Service: Vascular;  Laterality: Right;   BACK SURGERY  06/01/2012   lumbar   CAROTID ENDARTERECTOMY  02/01/2009   Right   CERVICAL FUSION     1995, 1996   CHOLECYSTECTOMY     CHOLECYSTECTOMY     ECTOPIC PREGNANCY SURGERY  89   ENDARTERECTOMY FEMORAL Right 02/14/2021   Procedure: RIGHT FEMORAL EXTENDED ENDARTERECTOMY WITH VEIN PATCH ANGIOPLASTY  USING GREATER SAPHENOUS VEIN HARVEST AND PROFUNDOPLASTY;  Surgeon: Leonie Douglas, MD;  Location: [facility] OR;  Service: Vascular;  Laterality: Right;   ERCP N/A 11/20/2015   Procedure: ENDOSCOPIC RETROGRADE CHOLANGIOPANCREATOGRAPHY (ERCP);  Surgeon: Willis Modena, MD;  Location: [facility] ENDOSCOPY;  Service: Endoscopy;  Laterality: N/A;   EUS N/A 11/20/2015   Procedure: UPPER ENDOSCOPIC ULTRASOUND (EUS) RADIAL;  Surgeon: Willis Modena, MD;  Location: [facility] ENDOSCOPY;  Service: Endoscopy;  Laterality: N/A;   INSERTION OF ILIAC STENT Right 02/14/2021   Procedure: INSERTION OF RIGHT ILIAC ARTERY STENT;  Surgeon: Leonie Douglas, MD;  Location: [facility] OR;  Service: Vascular;  Laterality: Right;   LOOP RECORDER INSERTION N/A 02/23/2020   Procedure: LOOP RECORDER INSERTION;  Surgeon: Regan Lemming, MD;  Location: [facility] INVASIVE CV LAB;  Service: Cardiovascular;  Laterality: N/A;   TUBAL LIGATION      Current Outpatient Medications  Medication Sig Dispense Refill   albuterol (PROVENTIL HFA) 108 (90 Base) MCG/ACT inhaler Inhale 1 puff into the lungs every 6 (six) hours as needed for wheezing or shortness of breath. 1 Inhaler 0   amLODipine (NORVASC) 10 MG tablet Take 10 mg by mouth daily.     apixaban (ELIQUIS) 5 MG TABS tablet Take 1 tablet (5 mg total) by mouth 2 (two) times daily. 60 tablet 3   aspirin EC 81 MG EC tablet Take 1 tablet (81 mg total) by mouth daily. Swallow whole. 30 tablet 11   atorvastatin (LIPITOR) 40 MG tablet Take 40 mg by mouth at bedtime.      clonazePAM (KLONOPIN) 0.5 MG tablet Take 0.5 mg by mouth 3 (three) times daily as needed for anxiety.     diphenhydrAMINE HCl (ZZZQUIL) 50 MG/30ML LIQD Take 50 mg by mouth at bedtime as needed (sleep).     FLUoxetine (PROZAC) 20 MG capsule Take 60 mg by mouth daily.      fluticasone (FLONASE) 50 MCG/ACT nasal spray Place 2 sprays into both nostrils daily.     furosemide (LASIX) 20 MG tablet Take 40 mg by mouth daily.     gabapentin (NEURONTIN) 300  MG capsule Take 300-600 mg by mouth See admin instructions. Take 300 mg in the morning, 300 mg at noon, and 600 mg at bedtime     ipratropium (ATROVENT) 0.03 % nasal spray Place 2 sprays into both nostrils every 12 (twelve) hours. 30 mL 12   lisinopril (ZESTRIL) 30 MG tablet Take 30 mg by mouth daily.      loratadine (CLARITIN) 10 MG tablet Take 10 mg by mouth daily.     metoprolol tartrate (LOPRESSOR) 25 MG tablet Take 25 mg by mouth 2 (two) times daily.     olopatadine (PATANOL) 0.1 % ophthalmic solution Place 1 drop into both eyes daily as needed (dry/itchy  eyes).     omeprazole (PRILOSEC) 20 MG capsule 1 capsule 30 minutes before morning meal     tiZANidine (ZANAFLEX) 4 MG tablet Take 4 mg by mouth every 8 (eight) hours as needed for muscle spasms.     TRELEGY ELLIPTA 100-62.5-25 MCG/INH AEPB Inhale 1 puff into the lungs daily.     No current facility-administered medications for this encounter.    Allergies  Allergen Reactions   Bupropion Other (See Comments)    Hallucinations    Septra [Sulfamethoxazole-Trimethoprim] Nausea And Vomiting   Sulfa Antibiotics Nausea And Vomiting   Zithromax [Azithromycin] Hives   Crestor [Rosuvastatin]     Other reaction(s): elevated liver functions   Cymbalta [Duloxetine Hcl] Other (See Comments)    fatigue   Zoloft [Sertraline]     Other reaction(s): fatigue    Social History   Socioeconomic History   Marital status: Divorced    Spouse name: Not on file   Number of children: Not on file   Years of education: Not on file   Highest education level: Not on file  Occupational History   Not on file  Tobacco Use   Smoking status: Former    Packs/day: 0.50    Years: 45.00    Total pack years: 22.50    Types: Cigarettes    Quit date: 11/18/2020    Years since quitting: 1.1   Smokeless tobacco: Never   Tobacco comments:    Former smoker 12/19/21  Vaping Use   Vaping Use: Never used  Substance and Sexual Activity   Alcohol use: Not  Currently    Comment: 2 times a year   Drug use: No   Sexual activity: Not on file  Other Topics Concern   Not on file  Social History Narrative   Epworth Sleepiness Scale = 10 (as of 05/21/2015)   Social Determinants of Health   Financial Resource Strain: Not on file  Food Insecurity: Not on file  Transportation Needs: Not on file  Physical Activity: Not on file  Stress: Not on file  Social Connections: Not on file  Intimate Partner Violence: Not on file     ROS- All systems are reviewed and negative except as per the HPI above.  Physical Exam: Vitals:   01/23/22 1140  BP: 126/60  Weight: 91.8 kg  Height: 4\' 11"  (1.499 m)     GEN- The patient is a well appearing obese female, alert and oriented x 3 today.   HEENT-head normocephalic, atraumatic, sclera clear, conjunctiva pink, hearing intact, trachea midline. Lungs- Clear to ausculation bilaterally, normal work of breathing Heart- Regular rate and rhythm, no murmurs, rubs or gallops  GI- soft, NT, ND, + BS Extremities- no clubbing, cyanosis, or edema MS- no significant deformity or atrophy Skin- no rash or lesion Psych- euthymic mood, full affect Neuro- strength and sensation are intact   Wt Readings from Last 3 Encounters:  01/23/22 91.8 kg  01/22/22 91.2 kg  01/15/22 89 kg    EKG today demonstrates  SB Vent. rate 53 BPM PR interval 156 ms QRS duration 76 ms QT/QTcB 434/407 ms  Echo 02/22/20 demonstrated  1. Left ventricular ejection fraction, by estimation, is 60 to 65%. The  left ventricle has normal function. The left ventricle has no regional  wall motion abnormalities. There is moderate left ventricular hypertrophy. Left ventricular diastolic parameters are indeterminate.   2. Right ventricular systolic function is normal. The right ventricular  size is normal. Tricuspid regurgitation signal is inadequate for  assessing PA pressure.   3. The mitral valve is grossly normal. No evidence of mitral valve   regurgitation.   4. The aortic valve was not well visualized. Aortic valve regurgitation  is not visualized. Mild to moderate aortic valve sclerosis/calcification  is present, without any evidence of aortic stenosis.   5. The inferior vena cava is normal in size with greater than 50%  respiratory variability, suggesting right atrial pressure of 3 mmHg.   Epic records are reviewed at length today  CHA2DS2-VASc Score = 6  The patient's score is based upon: CHF History: 0 HTN History: 1 Diabetes History: 0 Stroke History: 2 Vascular Disease History: 1 Age Score: 1 Gender Score: 1       ASSESSMENT AND PLAN: 1. Paroxysmal Atrial Fibrillation (ICD10:  I48.0) The patient's CHA2DS2-VASc score is 6, indicating a 9.7% annual risk of stroke.   ILR shows only one brief interim episode lasting 8 minutes. Continue Eliquis 5 mg BID Continue Lopressor 25 mg BID Continue to monitor afib burden on ILR. Check cbc today.  2. Secondary Hypercoagulable State (ICD10:  D68.69) The patient is at significant risk for stroke/thromboembolism based upon her CHA2DS2-VASc Score of 6.  Continue Apixaban (Eliquis).   3. Obesity Body mass index is 40.88 kg/m. Lifestyle modification was discussed and encouraged including regular physical activity and weight reduction.  4. HTN Stable, no changes today.  5. CAD Coronary calcification on CT On statin No anginal symptoms.  6. PVD S/p R CEA S/p R femoral endarterectomy and R common iliac artery stenting. Followed by Dr Lenell Antu.  7. OSA Patient reports compliance with CPAP therapy. Followed by Dr Wynona Neat.   Follow up with Dr Duke Salvia as scheduled. AF clinic in 6 months.    Jorja Loa PA-C [facility] [address] [place], [place] [address] [phone] 01/23/2022 12:08 PM

## 2022-01-28 ENCOUNTER — Ambulatory Visit
Admission: RE | Admit: 2022-01-28 | Discharge: 2022-01-28 | Disposition: A | Discharge status: Home / Self Care | Payer: Medicare HMO | Source: Ambulatory Visit | Attending: Acute Care | Admitting: Acute Care

## 2022-01-28 DIAGNOSIS — K838 Other specified diseases of biliary tract: Secondary | ICD-10-CM | POA: Diagnosis not present

## 2022-01-28 DIAGNOSIS — J432 Centrilobular emphysema: Secondary | ICD-10-CM | POA: Diagnosis not present

## 2022-01-28 DIAGNOSIS — Z87891 Personal history of nicotine dependence: Secondary | ICD-10-CM | POA: Diagnosis not present

## 2022-01-28 DIAGNOSIS — Z122 Encounter for screening for malignant neoplasm of respiratory organs: Secondary | ICD-10-CM

## 2022-01-28 DIAGNOSIS — I2510 Atherosclerotic heart disease of native coronary artery without angina pectoris: Secondary | ICD-10-CM | POA: Diagnosis not present

## 2022-01-29 ENCOUNTER — Other Ambulatory Visit: Payer: Self-pay | Admitting: Acute Care

## 2022-01-29 DIAGNOSIS — Z122 Encounter for screening for malignant neoplasm of respiratory organs: Secondary | ICD-10-CM

## 2022-01-29 DIAGNOSIS — Z87891 Personal history of nicotine dependence: Secondary | ICD-10-CM

## 2022-02-02 ENCOUNTER — Ambulatory Visit (INDEPENDENT_AMBULATORY_CARE_PROVIDER_SITE_OTHER): Payer: Medicare HMO

## 2022-02-02 DIAGNOSIS — I6340 Cerebral infarction due to embolism of unspecified cerebral artery: Secondary | ICD-10-CM

## 2022-02-03 LAB — CUP PACEART REMOTE DEVICE CHECK
Date Time Interrogation Session: 20230704000616
Implantable Pulse Generator Implant Date: 20210730

## 2022-02-06 ENCOUNTER — Telehealth: Payer: Self-pay | Admitting: Pulmonary Disease

## 2022-02-06 DIAGNOSIS — G4733 Obstructive sleep apnea (adult) (pediatric): Secondary | ICD-10-CM

## 2022-02-06 NOTE — Telephone Encounter (Signed)
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

## 2022-02-18 ENCOUNTER — Encounter: Payer: Self-pay | Admitting: Pulmonary Disease

## 2022-02-18 NOTE — Telephone Encounter (Signed)
Dr. Francine Graven, please advise on pt's email. She is requesting a letter to be excused from jury duty. Pt was last seen in 12/2021 for OSA on CPAP and COPD. Thanks.

## 2022-02-23 ENCOUNTER — Encounter: Payer: Self-pay | Admitting: *Deleted

## 2022-02-23 NOTE — Telephone Encounter (Signed)
Letter was given to pt's son. Nothing further needed.

## 2022-02-23 NOTE — Telephone Encounter (Signed)
I printed the letter. It's in an envelope in at my desk in [employer]. Called pt to verify address. Had to [facility].

## 2022-02-26 NOTE — Progress Notes (Signed)
Carelink Summary Report / Loop Recorder 

## 2022-02-27 ENCOUNTER — Encounter (HOSPITAL_BASED_OUTPATIENT_CLINIC_OR_DEPARTMENT_OTHER): Payer: Self-pay | Admitting: Cardiovascular Disease

## 2022-02-27 ENCOUNTER — Ambulatory Visit (HOSPITAL_BASED_OUTPATIENT_CLINIC_OR_DEPARTMENT_OTHER): Payer: Medicare HMO | Admitting: Cardiovascular Disease

## 2022-02-27 VITALS — BP 110/54 | HR 60 | Ht 59.0 in | Wt 204.2 lb

## 2022-02-27 DIAGNOSIS — I10 Essential (primary) hypertension: Secondary | ICD-10-CM

## 2022-02-27 DIAGNOSIS — I6523 Occlusion and stenosis of bilateral carotid arteries: Secondary | ICD-10-CM | POA: Diagnosis not present

## 2022-02-27 DIAGNOSIS — E7800 Pure hypercholesterolemia, unspecified: Secondary | ICD-10-CM | POA: Diagnosis not present

## 2022-02-27 DIAGNOSIS — I480 Paroxysmal atrial fibrillation: Secondary | ICD-10-CM

## 2022-02-27 DIAGNOSIS — Z5181 Encounter for therapeutic drug level monitoring: Secondary | ICD-10-CM

## 2022-02-27 DIAGNOSIS — R0609 Other forms of dyspnea: Secondary | ICD-10-CM | POA: Diagnosis not present

## 2022-02-27 MED ORDER — ATORVASTATIN CALCIUM 80 MG PO TABS: 80.0000 mg | ORAL_TABLET | Freq: Every day | ORAL | 3 refills | Status: DC

## 2022-02-27 NOTE — Assessment & Plan Note (Signed)
Currently in sinus rhythm Continue metoprolol --hold Eliquis starting tonight for upcoming IR aspiration of right hip joint 

## 2022-02-27 NOTE — Progress Notes (Signed)
Cardiology Office Note:    Date:  03/01/2022   ID:  Norma Scott, DOB [birth year], MRN [id number]  PCP:  Merri Brunette, MD   [facility] Providers Cardiologist:  Chilton Si, MD     Referring MD: Danice Goltz, PA   No chief complaint on file.   History of Present Illness:    Norma Scott is a 71 y.o. female with a hx of CAD, carotid artery occlusion, TIA, PVD s/p R CEA and s/p femoral endarterectomy and R common iliac artery stenting, hypertension, hyperlipidemia, fibromyalgia, COPD, and OSA on CPAP, here for the evaluation of paroxysmal atrial fibrillation at the request of Jorja Loa, PA.  She saw Jorja Loa, PA on 12/19/2021. It was noted that she was initially diagnosed with atrial fibrillation on ILR which was placed for cyrptogenic stroke in 2021. The device clinic received an alert on 12/17/21 for 7 afib episodes, longest 4.5 hours. She was asymptomatic during those episodes. Her CHADS2VASC score was 6. She was started on Eliquis 5 mg BID. She was continued on ASA given her significant PVD. She was referred to cardiology to re-establish care. She followed up with Jorja Loa, PA 01/23/2022 and her ILR showed one episode of Afib in the prior week.  Today, she is feeling fine. Her main concerns today is her weight gain and her lower back issues. She reports that she has displaced hardware in her back that will likely require surgery soon. Also she endorses COPD; her breathing is fine as long as she doesn't over exert herself. While walking out to the mailbox or making the bed she feels worn out and short of breath. This has gradually been worsening. She denies any anginal symptoms. Usually she stays active with watching and caring for her grandchildren. Additionally, her LE swelling is managed with furosemide 40 mg; she takes two tablets of 20 mg in the mornings. If she doesn't take 2 tablets her LE edema is significantly worse. She notes that her current  level of swelling is good for her. At this time she is in the process of obtaining a new CPAP machine; she has not been using any machine or oxygen while sleeping lately. She denies any palpitations, lightheadedness, headaches, syncope, orthopnea, or PND.   Past Medical History:  Diagnosis Date   Allergy    allergic rhinitis   Arteriosclerotic coronary artery disease 05/21/2015   Asthma    CAD (coronary artery disease)    patient says no problems   dr Severiano Gilbert pcp   Carotid artery occlusion    Cervical disc displacement    x2   COPD (chronic obstructive pulmonary disease) (HCC)    Dyspnea    Esophageal reflux    Essential hypertension 05/21/2015   Exertional dyspnea 03/01/2022   Family history of adverse reaction to anesthesia    mother hard to wake and affected b/p   Fibromyalgia    Gall stones    Generalized anxiety disorder    Headache    Hyperlipidemia    Lumbar disc herniation    L5-S1   Mini stroke    Peripheral vascular disease (HCC)    Sleep apnea    Stroke [facility])    July 2021   TIA (transient ischemic attack) 05/21/2015    Past Surgical History:  Procedure Laterality Date   ABDOMINAL AORTOGRAM W/LOWER EXTREMITY N/A 01/15/2021   Procedure: ABDOMINAL AORTOGRAM W/LOWER EXTREMITY;  Surgeon: Leonie Douglas, MD;  Location: [facility];  Service: Cardiovascular;  Laterality: N/A;   APPLICATION OF WOUND VAC Right 02/14/2021   Procedure: APPLICATION OF WOUND VAC;  Surgeon: Cherre Robins, MD;  Location: [place];  Service: Vascular;  Laterality: Right;   BACK SURGERY  06/01/2012   lumbar   CAROTID ENDARTERECTOMY  02/01/2009   Right   CERVICAL FUSION     1995, 1996   CHOLECYSTECTOMY     CHOLECYSTECTOMY     ECTOPIC PREGNANCY SURGERY  89   ENDARTERECTOMY FEMORAL Right 02/14/2021   Procedure: RIGHT FEMORAL EXTENDED ENDARTERECTOMY WITH VEIN PATCH ANGIOPLASTY USING GREATER SAPHENOUS VEIN HARVEST AND PROFUNDOPLASTY;  Surgeon: Cherre Robins, MD;  Location: [facility] OR;   Service: Vascular;  Laterality: Right;   ERCP N/A 11/20/2015   Procedure: ENDOSCOPIC RETROGRADE CHOLANGIOPANCREATOGRAPHY (ERCP);  Surgeon: Arta Silence, MD;  Location: [facility];  Service: Endoscopy;  Laterality: N/A;   EUS N/A 11/20/2015   Procedure: UPPER ENDOSCOPIC ULTRASOUND (EUS) RADIAL;  Surgeon: Arta Silence, MD;  Location: [facility];  Service: Endoscopy;  Laterality: N/A;   INSERTION OF ILIAC STENT Right 02/14/2021   Procedure: INSERTION OF RIGHT ILIAC ARTERY STENT;  Surgeon: Cherre Robins, MD;  Location: [place];  Service: Vascular;  Laterality: Right;   LOOP RECORDER INSERTION N/A 02/23/2020   Procedure: LOOP RECORDER INSERTION;  Surgeon: Constance Haw, MD;  Location: [place] [facility];  Service: Cardiovascular;  Laterality: N/A;   TUBAL LIGATION      Current Medications: Current Meds  Medication Sig   albuterol (PROVENTIL HFA) 108 (90 Base) MCG/ACT inhaler Inhale 1 puff into the lungs every 6 (six) hours as needed for wheezing or shortness of breath.   amLODipine (NORVASC) 10 MG tablet Take 10 mg by mouth daily.   apixaban (ELIQUIS) 5 MG TABS tablet Take 1 tablet (5 mg total) by mouth 2 (two) times daily.   aspirin EC 81 MG EC tablet Take 1 tablet (81 mg total) by mouth daily. Swallow whole.   clonazePAM (KLONOPIN) 0.5 MG tablet Take 0.5 mg by mouth 3 (three) times daily as needed for anxiety.   FLUoxetine (PROZAC) 20 MG capsule Take 60 mg by mouth daily.    fluticasone (FLONASE) 50 MCG/ACT nasal spray Place 2 sprays into both nostrils daily.   furosemide (LASIX) 20 MG tablet Take 40 mg by mouth daily.   gabapentin (NEURONTIN) 300 MG capsule Take 300-600 mg by mouth See admin instructions. Take 300 mg in the morning, 300 mg at noon, and 600 mg at bedtime   ipratropium (ATROVENT) 0.03 % nasal spray Place 2 sprays into both nostrils every 12 (twelve) hours.   lisinopril (ZESTRIL) 30 MG tablet Take 30 mg by mouth daily.    loratadine (CLARITIN) 10 MG tablet Take 10  mg by mouth daily.   metoprolol tartrate (LOPRESSOR) 25 MG tablet Take 25 mg by mouth 2 (two) times daily.   olopatadine (PATANOL) 0.1 % ophthalmic solution Place 1 drop into both eyes daily as needed (dry/itchy eyes).   omeprazole (PRILOSEC) 20 MG capsule 1 capsule 30 minutes before morning meal   tiZANidine (ZANAFLEX) 4 MG tablet Take 4 mg by mouth every 8 (eight) hours as needed for muscle spasms.   TRELEGY ELLIPTA 100-62.5-25 MCG/INH AEPB Inhale 1 puff into the lungs daily.   [DISCONTINUED] atorvastatin (LIPITOR) 40 MG tablet Take 40 mg by mouth at bedtime.      Allergies:   Bupropion, Septra [sulfamethoxazole-trimethoprim], Sulfa antibiotics, Zithromax [azithromycin], Crestor [rosuvastatin], Cymbalta [duloxetine hcl], and Zoloft [sertraline]   Social History  Socioeconomic History   Marital status: Divorced    Spouse name: Not on file   Number of children: Not on file   Years of education: Not on file   Highest education level: Not on file  Occupational History   Not on file  Tobacco Use   Smoking status: Former    Packs/day: 0.50    Years: 45.00    Total pack years: 22.50    Types: Cigarettes    Quit date: 11/18/2020    Years since quitting: 1.2   Smokeless tobacco: Never   Tobacco comments:    Former smoker 12/19/21  Vaping Use   Vaping Use: Never used  Substance and Sexual Activity   Alcohol use: Not Currently    Comment: 2 times a year   Drug use: No   Sexual activity: Not on file  Other Topics Concern   Not on file  Social History Narrative   Epworth Sleepiness Scale = 10 (as of 05/21/2015)   Social Determinants of Health   Financial Resource Strain: Not on file  Food Insecurity: Not on file  Transportation Needs: Not on file  Physical Activity: Not on file  Stress: Not on file  Social Connections: Not on file     Family History: The patient's family history includes Asthma in her brother; Breast cancer in her maternal aunt; Cancer in her maternal  grandfather; Cancer (age of onset: 7) in her paternal grandmother; Deep vein thrombosis in her brother; Diabetes in her father; Emphysema in her mother; Heart Problems in her maternal grandmother; Heart attack in her maternal grandfather, paternal grandfather, and paternal uncle; Heart disease in her father, paternal grandfather, and paternal uncle; Hyperlipidemia in her mother; Hypertension in her mother; Seizures in her mother; Stroke in her maternal grandmother and mother.  ROS:   Please see the history of present illness.    (+) Lower back pain (+) Exertional shortness of breath All other systems reviewed and are negative.  EKGs/Labs/Other Studies Reviewed:    The following studies were reviewed today:  CT Chest  01/28/2022: FINDINGS: Cardiovascular: Aortic atherosclerosis. Normal heart size, without pericardial effusion. Left main, LAD, right coronary artery calcification. Loop recorder about the anterior chest wall.   Mediastinum/Nodes: No mediastinal or definite hilar adenopathy, given limitations of unenhanced CT.   Lungs/Pleura: No pleural fluid. Moderate centrilobular emphysema. Right apical pleuroparenchymal scarring. Bilateral upper lung interstitial thickening is likely post infectious/inflammatory scarring.   Resolution of previously described left lower lobe nodules of interest. Pulmonary nodules of maximally volume derived equivalent diameter 3.1 mm.   Upper Abdomen: Normal imaged portions of the liver, spleen, stomach, pancreas, adrenal glands, kidneys. Cholecystectomy. Prominence of the common duct in the pancreatic head at 1.3 cm on 57/3 is similar. Similar back to 07/09/2015 MRCP.   Musculoskeletal: Osteopenia. Lower cervical and thoracic spondylosis.   IMPRESSION: 1. Lung-RADS 2, benign appearance or behavior. Continue annual screening with low-dose chest CT without contrast in 12 months. 2. Aortic Atherosclerosis (ICD10-I70.0) and Emphysema  (ICD10-J43.9). Coronary artery atherosclerosis. 3. 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  LE Arterial Duplex Study  04/01/2021: Summary:  Right: 30-49% stenosis noted in the deep femoral artery.   ABI Doppler  04/01/2021: Right ABIs and TBIs appear increased compared to prior study on  01/08/2021.     Summary:  Right: Resting right ankle-brachial index indicates mild right lower  extremity arterial disease. The right toe-brachial index is abnormal.   Left: Resting left ankle-brachial index indicates moderate  left lower  extremity arterial disease. The left toe-brachial index is abnormal.   Abdominal Aortogram  01/15/2021: OPERATIVE FINDINGS:  Aortogram unremarkable Occluded left common and internal iliac arteries with reconstitution of the external iliac artery Right common iliac stenosis (>80%) Right internal and external iliac arteries without significant stenosis Bilateral severe common femoral artery stenosis (>90%) Normal runoff from SFA --> tibial arteries bilaterally  PLAN: Needs hybrid vs open intervention. Bilateral common femoral endarterectomy with retrograde stenting may be best option. Could do this as a staged procedure. Aortobifemoral bypass an option, but BMI 38.5. Would like to avoid fem-fem bypass if possible. Check CT A/P without contrast to evaluate plaque burden in infrarenal aorta. Will ask cardiology for risk stratification. Follow up with me after the above.    EKG:  EKG is personally reviewed. 02/27/2022: Sinus rhythm. Rate 60 bpm. PACs. Cannot rule out prior septal infarct.  Recent Labs: 01/23/2022: Hemoglobin 12.9; Platelets 234   Recent Lipid Panel    Component Value Date/Time   CHOL 107 02/15/2021 0446   TRIG 39 02/15/2021 0446   HDL 38 (L) 02/15/2021 0446   CHOLHDL 2.8 02/15/2021 0446   VLDL 8 02/15/2021 0446   LDLCALC 61 02/15/2021 0446     Risk Assessment/Calculations:    CHA2DS2-VASc Score = 6    This indicates a 9.7% annual risk of stroke. The patient's score is based upon: CHF History: 0 HTN History: 1 Diabetes History: 0 Stroke History: 2 Vascular Disease History: 1 Age Score: 1 Gender Score: 1          Physical Exam:    Wt Readings from Last 3 Encounters:  02/27/22 204 lb 3.2 oz (92.6 kg)  01/23/22 202 lb 6.4 oz (91.8 kg)  01/22/22 201 lb 1 oz (91.2 kg)     VS:  BP (!) 110/54 (BP Location: Left Arm, Patient Position: Sitting, Cuff Size: Large)   Pulse 60   Ht 4\' 11"  (1.499 m)   Wt 204 lb 3.2 oz (92.6 kg)   BMI 41.24 kg/m  , BMI Body mass index is 41.24 kg/m. GENERAL:  Well appearing HEENT: Pupils equal round and reactive, fundi not visualized, oral mucosa unremarkable NECK:  No jugular venous distention, waveform within normal limits, carotid upstroke brisk and symmetric, no bruits, no thyromegaly LUNGS:  Clear to auscultation bilaterally HEART:  RRR.  PMI not displaced or sustained,S1 and S2 within normal limits, no S3, no S4, no clicks, no rubs, no murmurs ABD:  Flat, positive bowel sounds normal in frequency in pitch, no bruits, no rebound, no guarding, no midline pulsatile mass, no hepatomegaly, no splenomegaly EXT:  2 plus pulses throughout, no edema, no cyanosis no clubbing SKIN:  No rashes no nodules NEURO:  Cranial nerves II through XII grossly intact, motor grossly intact throughout PSYCH:  Cognitively intact, oriented to person place and time   ASSESSMENT:    1. Bilateral extracranial carotid artery stenosis   2. Paroxysmal atrial fibrillation (HCC)   3. DOE (dyspnea on exertion)   4. Hypercholesterolemia   5. Therapeutic drug monitoring   6. Essential hypertension   7. Exertional dyspnea   8. Pure hypercholesterolemia    PLAN:    Occlusion and stenosis of carotid artery without mention of cerebral infarction She has known carotid stenosis.  She previously had CEA on the right.  She is due to repeat carotid Dopplers.  Paroxysmal atrial  fibrillation (HCC) Currently in sinus rhythm.  Continue Eliquis and metoprolol.  Essential hypertension Blood pressure is  well-controlled on amlodipine, lisinopril, and metoprolol.  Exertional dyspnea She has exertional dyspnea, which may be her COPD.  However she has several risk factors, significant coronary calcification and isn't active due to orthopedic issues. We will get a Lexiscan Myoview to assess for ischemia.   Hyperlipidemia Here LDL goal is <70.  Increase atorvastatin to 80mg .  Repeat lipids/CMP in 2 months.    Shared Decision Making/Informed Consent The risks [chest pain, shortness of breath, cardiac arrhythmias, dizziness, blood pressure fluctuations, myocardial infarction, stroke/transient ischemic attack, nausea, vomiting, allergic reaction, radiation exposure, metallic taste sensation and life-threatening complications (estimated to be 1 in 10,000)], benefits (risk stratification, diagnosing coronary artery disease, treatment guidance) and alternatives of a nuclear stress test were discussed in detail with Ms. Konarski and she agrees to proceed.   Disposition: FU with Lynnzie Blackson C. Oval Linsey, MD, [facility] in 3 months.  Medication Adjustments/Labs and Tests Ordered: Current medicines are reviewed at length with the patient today.  Concerns regarding medicines are outlined above.   Orders Placed This Encounter  Procedures   Lipid panel   Comprehensive metabolic panel   Cardiac Stress Test: Informed Consent Details: Physician/Practitioner Attestation; Transcribe to consent form and obtain patient signature   MYOCARDIAL PERFUSION IMAGING   EKG 12-Lead   VAS US CAROTID   Meds ordered this encounter  Medications   DISCONTD: atorvastatin (LIPITOR) 80 MG tablet    Sig: Take 1 tablet (80 mg total) by mouth at bedtime.    Dispense:  90 tablet    Refill:  3    D/c 40 mg rx, new dose   atorvastatin (LIPITOR) 80 MG tablet    Sig: Take 1 tablet (80 mg total) by mouth at bedtime.     Dispense:  90 tablet    Refill:  3    D/c 40 mg rx, new dose   Patient Instructions  Medication Instructions:  INCREASE YOUR ATORVASTATIN TO 80 MG DAILY   *If you need a refill on your cardiac medications before your next appointment, please call your pharmacy*  Lab Work: FASTING LP/CMET IN 2-3 MONTHS  If you have labs (blood work) drawn today and your tests are completely normal, you will receive your results only by: [place] (if you have MyChart) OR A paper copy in the mail If you have any lab test that is abnormal or we need to change your treatment, we will call you to review the results.  Testing/Procedures: Your physician has requested that you have a lexiscan myoview. For further information please visit [email or web address]. Please follow instruction sheet, as given.  Your physician has requested that you have a carotid duplex. This test is an ultrasound of the carotid arteries in your neck. It looks at blood flow through these arteries that supply the brain with blood. Allow one hour for this exam. There are no restrictions or special instructions.  Follow-Up: At [facility], you and your health needs are our priority.  As part of our continuing mission to provide you with exceptional heart care, we have created designated Provider Care Teams.  These Care Teams include your primary Cardiologist (physician) and Advanced Practice Providers (APPs -  Physician Assistants and Nurse Practitioners) who all work together to provide you with the care you need, when you need it.  We recommend signing up for the patient portal called "MyChart".  Sign up information is provided on this After Visit Summary.  MyChart is used to connect with patients for Virtual Visits (Telemedicine).  Patients are able  to view lab/test results, encounter notes, upcoming appointments, etc.  Non-urgent messages can be sent to your provider as well.   To learn more about what you can do with MyChart,  go to [email or web address].    Your next appointment:   3 month(s)  The format for your next appointment:   In Person  Provider:   Skeet Latch, MD           [facility] [place]ump[place],acting as a scribe for Skeet Latch, MD.,have documented all relevant documentation on the behalf of Skeet Latch, MD,as directed by  Skeet Latch, MD while in the presence of Skeet Latch, MD.  I, Newton Oval Linsey, MD have reviewed all documentation for this visit.  The documentation of the exam, diagnosis, procedures, and orders on 03/01/2022 are all accurate and complete.   Signed, Skeet Latch, MD  03/01/2022 8:10 AM    [place]

## 2022-02-27 NOTE — Patient Instructions (Signed)
Medication Instructions:  INCREASE YOUR ATORVASTATIN TO 80 MG DAILY   *If you need a refill on your cardiac medications before your next appointment, please call your pharmacy*  Lab Work: FASTING LP/CMET IN 2-3 MONTHS  If you have labs (blood work) drawn today and your tests are completely normal, you will receive your results only by: MyChart Message (if you have MyChart) OR A paper copy in the mail If you have any lab test that is abnormal or we need to change your treatment, we will call you to review the results.  Testing/Procedures: Your physician has requested that you have a lexiscan myoview. For further information please visit [email or web address] Please follow instruction sheet, as given.  Your physician has requested that you have a carotid duplex. This test is an ultrasound of the carotid arteries in your neck. It looks at blood flow through these arteries that supply the brain with blood. Allow one hour for this exam. There are no restrictions or special instructions.  Follow-Up: At [facility], you and your health needs are our priority.  As part of our continuing mission to provide you with exceptional heart care, we have created designated Provider Care Teams.  These Care Teams include your primary Cardiologist (physician) and Advanced Practice Providers (APPs -  Physician Assistants and Nurse Practitioners) who all work together to provide you with the care you need, when you need it.  We recommend signing up for the patient portal called "MyChart".  Sign up information is provided on this After Visit Summary.  MyChart is used to connect with patients for Virtual Visits (Telemedicine).  Patients are able to view lab/test results, encounter notes, upcoming appointments, etc.  Non-urgent messages can be sent to your provider as well.   To learn more about what you can do with MyChart, go to [email or web address].    Your next appointment:   3 month(s)  The format for  your next appointment:   In Person  Provider:   Chilton Si, MD

## 2022-02-27 NOTE — Assessment & Plan Note (Signed)
She has known carotid stenosis.  She previously had CEA on the right.  She is due to repeat carotid Dopplers.

## 2022-03-01 ENCOUNTER — Encounter (HOSPITAL_BASED_OUTPATIENT_CLINIC_OR_DEPARTMENT_OTHER): Payer: Self-pay | Admitting: Cardiovascular Disease

## 2022-03-01 DIAGNOSIS — R0609 Other forms of dyspnea: Secondary | ICD-10-CM

## 2022-03-01 NOTE — Assessment & Plan Note (Signed)
Blood pressure is well-controlled on amlodipine, lisinopril, and metoprolol.

## 2022-03-01 NOTE — Assessment & Plan Note (Signed)
She has exertional dyspnea, which may be her COPD.  However she has several risk factors, significant coronary calcification and isn't active due to orthopedic issues. We will get a Lexiscan Myoview to assess for ischemia.

## 2022-03-01 NOTE — Assessment & Plan Note (Signed)
Here LDL goal is <70.  Increase atorvastatin to 80mg .  Repeat lipids/CMP in 2 months.

## 2022-03-03 ENCOUNTER — Telehealth (HOSPITAL_COMMUNITY): Payer: Self-pay | Admitting: *Deleted

## 2022-03-03 NOTE — Telephone Encounter (Signed)
Left message on voicemail per DPR in reference to upcoming appointment scheduled on 03/09/2022 at 10:15 with detailed instructions given per Myocardial Perfusion Study Information Sheet for the test. LM to arrive 15 minutes early, and that it is imperative to arrive on time for appointment to keep from having the test rescheduled. If you need to cancel or reschedule your appointment, please call the office within 24 hours of your appointment. Failure to do so may result in a cancellation of your appointment, and a $50 no show fee. Phone number given for call back for any questions.

## 2022-03-05 LAB — CUP PACEART REMOTE DEVICE CHECK
Date Time Interrogation Session: 20230806000850
Implantable Pulse Generator Implant Date: 20210730

## 2022-03-09 ENCOUNTER — Ambulatory Visit (HOSPITAL_COMMUNITY): Payer: Medicare HMO | Attending: Cardiovascular Disease

## 2022-03-09 ENCOUNTER — Encounter (HOSPITAL_COMMUNITY): Payer: Self-pay | Admitting: *Deleted

## 2022-03-09 ENCOUNTER — Ambulatory Visit: Payer: Medicare HMO

## 2022-03-09 DIAGNOSIS — R0609 Other forms of dyspnea: Secondary | ICD-10-CM

## 2022-03-09 DIAGNOSIS — I480 Paroxysmal atrial fibrillation: Secondary | ICD-10-CM

## 2022-03-09 MED ORDER — TECHNETIUM TC 99M TETROFOSMIN IV KIT
29.3000 | PACK | Freq: Once | INTRAVENOUS | Status: AC | PRN
  Administered 2022-03-09: 29.3 via INTRAVENOUS

## 2022-03-13 ENCOUNTER — Ambulatory Visit (HOSPITAL_COMMUNITY): Payer: Medicare HMO | Attending: Cardiovascular Disease

## 2022-03-13 DIAGNOSIS — I480 Paroxysmal atrial fibrillation: Secondary | ICD-10-CM | POA: Insufficient documentation

## 2022-03-13 DIAGNOSIS — R0609 Other forms of dyspnea: Secondary | ICD-10-CM | POA: Insufficient documentation

## 2022-03-13 LAB — MYOCARDIAL PERFUSION IMAGING
Base ST Depression (mm): 0 mm
LV dias vol: 89 mL (ref 46–106)
LV sys vol: 43 mL
Nuc Stress EF: 52 %
Peak HR: 73 {beats}/min
Rest HR: 61 {beats}/min
Rest Nuclear Isotope Dose: 29.3 mCi
SDS: 0
SRS: 0
SSS: 0
ST Depression (mm): 0 mm
Stress Nuclear Isotope Dose: 31.4 mCi
TID: 1.09

## 2022-03-13 MED ORDER — TECHNETIUM TC 99M TETROFOSMIN IV KIT
31.4000 | PACK | Freq: Once | INTRAVENOUS | Status: AC | PRN
  Administered 2022-03-13: 31.4 via INTRAVENOUS

## 2022-03-13 MED ORDER — REGADENOSON 0.4 MG/5ML IV SOLN
0.4000 mg | Freq: Once | INTRAVENOUS | Status: AC
  Administered 2022-03-13: 0.4 mg via INTRAVENOUS

## 2022-03-18 ENCOUNTER — Encounter (HOSPITAL_BASED_OUTPATIENT_CLINIC_OR_DEPARTMENT_OTHER): Payer: Medicare HMO

## 2022-03-20 ENCOUNTER — Telehealth (HOSPITAL_BASED_OUTPATIENT_CLINIC_OR_DEPARTMENT_OTHER): Payer: Self-pay

## 2022-03-20 NOTE — Telephone Encounter (Addendum)
Results called to patient who verbalizes understanding!     ----- Message from Alver Sorrow, NP sent at 03/17/2022  5:02 PM EDT ----- Stress test low risk with no evidence of ischemia.  Great result!

## 2022-04-02 DIAGNOSIS — I4891 Unspecified atrial fibrillation: Secondary | ICD-10-CM | POA: Diagnosis not present

## 2022-04-02 DIAGNOSIS — I10 Essential (primary) hypertension: Secondary | ICD-10-CM | POA: Diagnosis not present

## 2022-04-02 DIAGNOSIS — J449 Chronic obstructive pulmonary disease, unspecified: Secondary | ICD-10-CM | POA: Diagnosis not present

## 2022-04-02 DIAGNOSIS — E7800 Pure hypercholesterolemia, unspecified: Secondary | ICD-10-CM | POA: Diagnosis not present

## 2022-04-03 ENCOUNTER — Ambulatory Visit (INDEPENDENT_AMBULATORY_CARE_PROVIDER_SITE_OTHER): Payer: Medicare HMO

## 2022-04-03 DIAGNOSIS — I6523 Occlusion and stenosis of bilateral carotid arteries: Secondary | ICD-10-CM | POA: Diagnosis not present

## 2022-04-13 ENCOUNTER — Ambulatory Visit (INDEPENDENT_AMBULATORY_CARE_PROVIDER_SITE_OTHER): Payer: Medicare HMO

## 2022-04-13 DIAGNOSIS — I6340 Cerebral infarction due to embolism of unspecified cerebral artery: Secondary | ICD-10-CM | POA: Diagnosis not present

## 2022-04-14 LAB — CUP PACEART REMOTE DEVICE CHECK
Date Time Interrogation Session: 20230918001135
Implantable Pulse Generator Implant Date: 20210730

## 2022-04-16 ENCOUNTER — Other Ambulatory Visit: Payer: Self-pay | Admitting: Pulmonary Disease

## 2022-04-16 DIAGNOSIS — R0982 Postnasal drip: Secondary | ICD-10-CM

## 2022-04-24 ENCOUNTER — Other Ambulatory Visit (HOSPITAL_COMMUNITY): Payer: Self-pay | Admitting: Physician Assistant

## 2022-04-28 NOTE — Progress Notes (Signed)
Carelink Summary Report / Loop Recorder 

## 2022-05-13 ENCOUNTER — Other Ambulatory Visit (HOSPITAL_BASED_OUTPATIENT_CLINIC_OR_DEPARTMENT_OTHER): Payer: Self-pay | Admitting: Cardiovascular Disease

## 2022-05-13 ENCOUNTER — Other Ambulatory Visit (HOSPITAL_COMMUNITY): Payer: Self-pay | Admitting: Cardiovascular Disease

## 2022-05-13 DIAGNOSIS — I779 Disorder of arteries and arterioles, unspecified: Secondary | ICD-10-CM

## 2022-05-18 ENCOUNTER — Ambulatory Visit (INDEPENDENT_AMBULATORY_CARE_PROVIDER_SITE_OTHER): Payer: Medicare HMO

## 2022-05-18 DIAGNOSIS — I6340 Cerebral infarction due to embolism of unspecified cerebral artery: Secondary | ICD-10-CM

## 2022-05-20 LAB — CUP PACEART REMOTE DEVICE CHECK
Date Time Interrogation Session: 20231023000650
Implantable Pulse Generator Implant Date: 20210730

## 2022-06-01 ENCOUNTER — Ambulatory Visit (HOSPITAL_BASED_OUTPATIENT_CLINIC_OR_DEPARTMENT_OTHER): Payer: Medicare HMO | Admitting: Cardiovascular Disease

## 2022-06-01 ENCOUNTER — Encounter (HOSPITAL_BASED_OUTPATIENT_CLINIC_OR_DEPARTMENT_OTHER): Payer: Self-pay | Admitting: Cardiovascular Disease

## 2022-06-01 VITALS — BP 128/70 | HR 71 | Ht 59.0 in | Wt 204.4 lb

## 2022-06-01 DIAGNOSIS — E7800 Pure hypercholesterolemia, unspecified: Secondary | ICD-10-CM | POA: Diagnosis not present

## 2022-06-01 DIAGNOSIS — I10 Essential (primary) hypertension: Secondary | ICD-10-CM

## 2022-06-01 DIAGNOSIS — I480 Paroxysmal atrial fibrillation: Secondary | ICD-10-CM

## 2022-06-01 DIAGNOSIS — I6523 Occlusion and stenosis of bilateral carotid arteries: Secondary | ICD-10-CM | POA: Diagnosis not present

## 2022-06-01 DIAGNOSIS — I739 Peripheral vascular disease, unspecified: Secondary | ICD-10-CM

## 2022-06-01 NOTE — Patient Instructions (Signed)
Medication Instructions:  Your physician recommends that you continue on your current medications as directed. Please refer to the Current Medication list given to you today.   *If you need a refill on your cardiac medications before your next appointment, please call your pharmacy*  Lab Work: NONE  Testing/Procedures: NONE   Follow-Up: At [facility], you and your health needs are our priority.  As part of our continuing mission to provide you with exceptional heart care, we have created designated Provider Care Teams.  These Care Teams include your primary Cardiologist (physician) and Advanced Practice Providers (APPs -  Physician Assistants and Nurse Practitioners) who all work together to provide you with the care you need, when you need it.  We recommend signing up for the patient portal called "MyChart".  Sign up information is provided on this After Visit Summary.  MyChart is used to connect with patients for Virtual Visits (Telemedicine).  Patients are able to view lab/test results, encounter notes, upcoming appointments, etc.  Non-urgent messages can be sent to your provider as well.   To learn more about what you can do with MyChart, go to [email or web address].    Your next appointment:    6 MONTHS  The format for your next appointment:   In Person  Provider:   Laurann Montana, NP   DR [facility] IN 1 YEAR   Other Instructions [place]

## 2022-06-01 NOTE — Assessment & Plan Note (Signed)
Blood pressure is well-controlled.  Continue amlodipine, lisinopril, metoprolol.  Encouraged her to increase her exercise and will refer to PREP.

## 2022-06-01 NOTE — Assessment & Plan Note (Signed)
Currently in sinus rhythm.  Continue Eliquis and metorpolol.

## 2022-06-01 NOTE — Assessment & Plan Note (Signed)
She has carotid stenosis and prior femoral endarterectomy.  Symptoms are stable.  Continue aspirin and atorvastatin.

## 2022-06-01 NOTE — Assessment & Plan Note (Signed)
She is s/p R CEA.  Repeat Dopplers were stable.  Checking fasting lipids/CMP with LDL goal <70.  Continue aspirin and atorvastatin.

## 2022-06-01 NOTE — Assessment & Plan Note (Addendum)
LDL goal is <70.  Continue atorvastatin.  She is due for fasting lipids with her PCP soon.

## 2022-06-01 NOTE — Progress Notes (Signed)
Cardiology Office Note:    Date:  06/01/2022   ID:  Norma Scott, DOB 04/09/1951, MRN [id number]  PCP:  Merri BrunetteSmith, Candace, MD   [facility] Providers Cardiologist:  Chilton Siiffany [place], MD     Referring MD: Merri BrunetteSmith, Candace, MD   No chief complaint on file.   History of Present Illness:    Norma Stanleyorma Crews Fout is a 71 y.o. female with a hx of CAD, carotid artery occlusion, TIA, PVD s/p R CEA and s/p femoral endarterectomy and R common iliac artery stenting, hypertension, hyperlipidemia, fibromyalgia, COPD, and OSA on CPAP, here for the evaluation of paroxysmal atrial fibrillation at the request of Jorja Loaicky Fenton, PA.  She saw Jorja Loaicky Fenton, PA on 12/19/2021. It was noted that she was initially diagnosed with atrial fibrillation on ILR which was placed for cyrptogenic stroke in 2021. The device clinic received an alert on 12/17/21 for 7 afib episodes, longest 4.5 hours. She was asymptomatic during those episodes. Her CHADS2VASC score was 6. She was started on Eliquis 5 mg BID. She was continued on ASA given her significant PVD. She was referred to cardiology to re-establish care. She followed up with Jorja Loaicky Fenton, PA 01/23/2022 and her ILR showed one episode of Afib in the prior week.  At the last visit she reported exertional dyspnea. Nuclear stress 02/2022 was negative for ischemia. Carotid dopplers 03/2022 were stable.  Today, she says she has been okay. She has an appointment with her PCP tomorrow and will likely be getting labs. She states her breathing has been about the same. Of note, she mentions that she fell twice while attempting to get into bed recently. In regards to exercise, she has not been exercising very much recently. She got out her treadmill again yesterday. She denies any palpitations, chest pain, shortness of breath, or peripheral edema. No lightheadedness, headaches, syncope, orthopnea, or PND.  Past Medical History:  Diagnosis Date   Allergy    allergic rhinitis    Arteriosclerotic coronary artery disease 05/21/2015   Asthma    CAD (coronary artery disease)    patient says no problems   dr Williemae Nattercandice Katrinka Blazingsmith pcp   Cervical disc displacement    x2   COPD (chronic obstructive pulmonary disease) (HCC)    Dyspnea    Esophageal reflux    Essential hypertension 05/21/2015   Exertional dyspnea 03/01/2022   Family history of adverse reaction to anesthesia    mother hard to wake and affected b/p   Fibromyalgia    Gall stones    Generalized anxiety disorder    Headache    Hyperlipidemia    Lumbar disc herniation    L5-S1   Mini stroke    Peripheral vascular disease (HCC)    Sleep apnea    Stroke [facility](HCC)    July 2021   TIA (transient ischemic attack) 05/21/2015    Past Surgical History:  Procedure Laterality Date   ABDOMINAL AORTOGRAM W/LOWER EXTREMITY N/A 01/15/2021   Procedure: ABDOMINAL AORTOGRAM W/LOWER EXTREMITY;  Surgeon: Leonie DouglasHawken, Thomas N, MD;  Location: [facility];  Service: Cardiovascular;  Laterality: N/A;   APPLICATION OF WOUND VAC Right 02/14/2021   Procedure: APPLICATION OF WOUND VAC;  Surgeon: Leonie DouglasHawken, Thomas N, MD;  [facility];  Service: Vascular;  Laterality: Right;   BACK SURGERY  06/01/2012   lumbar   CAROTID ENDARTERECTOMY  02/01/2009   Right   CERVICAL FUSION     1995, 1996   CHOLECYSTECTOMY     CHOLECYSTECTOMY  ECTOPIC PREGNANCY SURGERY  93   ENDARTERECTOMY FEMORAL Right 02/14/2021   Procedure: RIGHT FEMORAL EXTENDED ENDARTERECTOMY WITH VEIN PATCH ANGIOPLASTY USING GREATER SAPHENOUS VEIN HARVEST AND PROFUNDOPLASTY;  Surgeon: Leonie Douglas, MD;  Location: [facility] OR;  Service: Vascular;  Laterality: Right;   ERCP N/A 11/20/2015   Procedure: ENDOSCOPIC RETROGRADE CHOLANGIOPANCREATOGRAPHY (ERCP);  Surgeon: Willis Modena, MD;  Location: [facility];  Service: Endoscopy;  Laterality: N/A;   EUS N/A 11/20/2015   Procedure: UPPER ENDOSCOPIC ULTRASOUND (EUS) RADIAL;  Surgeon: Willis Modena, MD;  Location: [facility];   Service: Endoscopy;  Laterality: N/A;   INSERTION OF ILIAC STENT Right 02/14/2021   Procedure: INSERTION OF RIGHT ILIAC ARTERY STENT;  Surgeon: Leonie Douglas, MD;  Location: [facility] OR;  Service: Vascular;  Laterality: Right;   LOOP RECORDER INSERTION N/A 02/23/2020   Procedure: LOOP RECORDER INSERTION;  Surgeon: Regan Lemming, MD;  Location: [facility];  Service: Cardiovascular;  Laterality: N/A;   TUBAL LIGATION      Current Medications: Current Meds  Medication Sig   albuterol (PROVENTIL HFA) 108 (90 Base) MCG/ACT inhaler Inhale 1 puff into the lungs every 6 (six) hours as needed for wheezing or shortness of breath.   amLODipine (NORVASC) 10 MG tablet Take 10 mg by mouth daily.   aspirin EC 81 MG EC tablet Take 1 tablet (81 mg total) by mouth daily. Swallow whole.   atorvastatin (LIPITOR) 80 MG tablet Take 1 tablet (80 mg total) by mouth at bedtime.   clonazePAM (KLONOPIN) 0.5 MG tablet Take 0.5 mg by mouth 3 (three) times daily as needed for anxiety.   ELIQUIS 5 MG TABS tablet Take 1 tablet by mouth twice daily   FLUoxetine (PROZAC) 20 MG capsule Take 60 mg by mouth daily.    fluticasone (FLONASE) 50 MCG/ACT nasal spray Use 2 spray(s) in each nostril once daily   furosemide (LASIX) 20 MG tablet Take 40 mg by mouth daily.   gabapentin (NEURONTIN) 300 MG capsule Take 300-600 mg by mouth See admin instructions. Take 300 mg in the morning, 300 mg at noon, and 600 mg at bedtime   ipratropium (ATROVENT) 0.03 % nasal spray USE 2 SPRAY(S) IN EACH NOSTRIL EVERY 12 HOURS   lisinopril (ZESTRIL) 30 MG tablet Take 30 mg by mouth daily.    loratadine (CLARITIN) 10 MG tablet Take 10 mg by mouth daily.   metoprolol tartrate (LOPRESSOR) 25 MG tablet Take 25 mg by mouth 2 (two) times daily.   olopatadine (PATANOL) 0.1 % ophthalmic solution Place 1 drop into both eyes daily as needed (dry/itchy eyes).   omeprazole (PRILOSEC) 20 MG capsule 1 capsule 30 minutes before morning meal   tiZANidine  (ZANAFLEX) 4 MG tablet Take 4 mg by mouth every 8 (eight) hours as needed for muscle spasms.   TRELEGY ELLIPTA 100-62.5-25 MCG/INH AEPB Inhale 1 puff into the lungs daily.     Allergies:   Bupropion, Septra [sulfamethoxazole-trimethoprim], Sulfa antibiotics, Zithromax [azithromycin], Crestor [rosuvastatin], Cymbalta [duloxetine hcl], and Zoloft [sertraline]   Social History   Socioeconomic History   Marital status: Divorced    Spouse name: Not on file   Number of children: Not on file   Years of education: Not on file   Highest education level: Not on file  Occupational History   Not on file  Tobacco Use   Smoking status: Former    Packs/day: 0.50    Years: 45.00    Total pack years: 22.50    Types: Cigarettes  Quit date: 11/18/2020    Years since quitting: 1.5   Smokeless tobacco: Never   Tobacco comments:    Former smoker 12/19/21  Vaping Use   Vaping Use: Never used  Substance and Sexual Activity   Alcohol use: Not Currently    Comment: 2 times a year   Drug use: No   Sexual activity: Not on file  Other Topics Concern   Not on file  Social History Narrative   Epworth Sleepiness Scale = 10 (as of 05/21/2015)   Social Determinants of Health   Financial Resource Strain: Not on file  Food Insecurity: Not on file  Transportation Needs: Not on file  Physical Activity: Not on file  Stress: Not on file  Social Connections: Not on file     Family History: The patient's family history includes Asthma in her brother; Breast cancer in her maternal aunt; Cancer in her maternal grandfather; Cancer (age of onset: 63) in her paternal grandmother; Deep vein thrombosis in her brother; Diabetes in her father; Emphysema in her mother; Heart Problems in her maternal grandmother; Heart attack in her maternal grandfather, paternal grandfather, and paternal uncle; Heart disease in her father, paternal grandfather, and paternal uncle; Hyperlipidemia in her mother; Hypertension in her  mother; Seizures in her mother; Stroke in her maternal grandmother and mother.  ROS:   Please see the history of present illness.    All other systems reviewed and are negative.  EKGs/Labs/Other Studies Reviewed:    The following studies were reviewed today:  CT Chest  01/28/2022: FINDINGS: Cardiovascular: Aortic atherosclerosis. Normal heart size, without pericardial effusion. Left main, LAD, right coronary artery calcification. Loop recorder about the anterior chest wall.   Mediastinum/Nodes: No mediastinal or definite hilar adenopathy, given limitations of unenhanced CT.   Lungs/Pleura: No pleural fluid. Moderate centrilobular emphysema. Right apical pleuroparenchymal scarring. Bilateral upper lung interstitial thickening is likely post infectious/inflammatory scarring.   Resolution of previously described left lower lobe nodules of interest. Pulmonary nodules of maximally volume derived equivalent diameter 3.1 mm.   Upper Abdomen: Normal imaged portions of the liver, spleen, stomach, pancreas, adrenal glands, kidneys. Cholecystectomy. Prominence of the common duct in the pancreatic head at 1.3 cm on 57/3 is similar. Similar back to 07/09/2015 MRCP.   Musculoskeletal: Osteopenia. Lower cervical and thoracic spondylosis.   IMPRESSION: 1. Lung-RADS 2, benign appearance or behavior. Continue annual screening with low-dose chest CT without contrast in 12 months. 2. Aortic Atherosclerosis (ICD10-I70.0) and Emphysema (ICD10-J43.9). Coronary artery atherosclerosis. 3. 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  LE Arterial Duplex Study  04/01/2021: Summary:  Right: 30-49% stenosis noted in the deep femoral artery.   ABI Doppler  04/01/2021: Right ABIs and TBIs appear increased compared to prior study on  01/08/2021.     Summary:  Right: Resting right ankle-brachial index indicates mild right lower  extremity arterial disease. The  right toe-brachial index is abnormal.   Left: Resting left ankle-brachial index indicates moderate left lower  extremity arterial disease. The left toe-brachial index is abnormal.   Abdominal Aortogram  01/15/2021: OPERATIVE FINDINGS:  Aortogram unremarkable Occluded left common and internal iliac arteries with reconstitution of the external iliac artery Right common iliac stenosis (>80%) Right internal and external iliac arteries without significant stenosis Bilateral severe common femoral artery stenosis (>90%) Normal runoff from SFA --> tibial arteries bilaterally  PLAN: Needs hybrid vs open intervention. Bilateral common femoral endarterectomy with retrograde stenting may be best option. Could do this as a  staged procedure. Aortobifemoral bypass an option, but BMI 38.5. Would like to avoid fem-fem bypass if possible. Check CT A/P without contrast to evaluate plaque burden in infrarenal aorta. Will ask cardiology for risk stratification. Follow up with me after the above.    EKG:  EKG is personally reviewed. 06/01/22: EKG was not ordered. 02/27/2022: Sinus rhythm. Rate 60 bpm. PACs. Cannot rule out prior septal infarct.  Recent Labs: 01/23/2022: Hemoglobin 12.9; Platelets 234   Recent Lipid Panel    Component Value Date/Time   CHOL 107 02/15/2021 0446   TRIG 39 02/15/2021 0446   HDL 38 (L) 02/15/2021 0446   CHOLHDL 2.8 02/15/2021 0446   VLDL 8 02/15/2021 0446   LDLCALC 61 02/15/2021 0446     Risk Assessment/Calculations:    CHA2DS2-VASc Score = 6   This indicates a 9.7% annual risk of stroke. The patient's score is based upon: CHF History: 0 HTN History: 1 Diabetes History: 0 Stroke History: 2 Vascular Disease History: 1 Age Score: 1 Gender Score: 1          Physical Exam:    Wt Readings from Last 3 Encounters:  06/01/22 204 lb 6.4 oz (92.7 kg)  03/09/22 204 lb (92.5 kg)  02/27/22 204 lb 3.2 oz (92.6 kg)    VS:  BP 128/70   Pulse 71   Ht 4\' 11"  (1.499 m)    Wt 204 lb 6.4 oz (92.7 kg)   SpO2 94%   BMI 41.28 kg/m  , BMI Body mass index is 41.28 kg/m. GENERAL:  Well appearing HEENT: Pupils equal round and reactive, fundi not visualized, oral mucosa unremarkable NECK:  No jugular venous distention, waveform within normal limits, carotid upstroke brisk and symmetric, no bruits, no thyromegaly LUNGS:  Clear to auscultation bilaterally HEART:  RRR.  PMI not displaced or sustained,S1 and S2 within normal limits, no S3, no S4, no clicks, no rubs, no murmurs ABD:  Flat, positive bowel sounds normal in frequency in pitch, no bruits, no rebound, no guarding, no midline pulsatile mass, no hepatomegaly, no splenomegaly EXT:  2 plus pulses throughout, no edema, no cyanosis no clubbing SKIN:  No rashes no nodules NEURO:  Cranial nerves II through XII grossly intact, motor grossly intact throughout PSYCH:  Cognitively intact, oriented to person place and time   ASSESSMENT:    1. Essential hypertension   2. Paroxysmal atrial fibrillation (HCC)   3. Pure hypercholesterolemia   4. Carotid stenosis, bilateral   5. PAD (peripheral artery disease) (HCC)     PLAN:    Essential hypertension Blood pressure is well-controlled.  Continue amlodipine, lisinopril, metoprolol.  Encouraged her to increase her exercise and will refer to PREP.  Paroxysmal atrial fibrillation (HCC) Currently in sinus rhythm.  Continue Eliquis and metorpolol.    Hyperlipidemia LDL goal is <70.  Continue atorvastatin.  She is due for fasting lipids with her PCP soon.  Carotid stenosis, bilateral She is s/p R CEA.  Repeat Dopplers were stable.  Checking fasting lipids/CMP with LDL goal <70.  Continue aspirin and atorvastatin.  PAD (peripheral artery disease) ([place]) She has carotid stenosis and prior femoral endarterectomy.  Symptoms are stable.  Continue aspirin and atorvastatin.  Shared Decision Making/Informed Consent The risks [chest pain, shortness of breath, cardiac  arrhythmias, dizziness, blood pressure fluctuations, myocardial infarction, stroke/transient ischemic attack, nausea, vomiting, allergic reaction, radiation exposure, metallic taste sensation and life-threatening complications (estimated to be 1 in 10,000)], benefits (risk stratification, diagnosing coronary artery disease, treatment guidance) and alternatives  of a nuclear stress test were discussed in detail with Ms. Stokely and she agrees to proceed.   Disposition: FU with APP in 6 months. FU with Helaina Stefano C. Duke Salvia, MD, [facility] in 1 year.  Medication Adjustments/Labs and Tests Ordered: Current medicines are reviewed at length with the patient today.  Concerns regarding medicines are outlined above.   Orders Placed This Encounter  Procedures   Amb Referral To Provider Referral Exercise Program (P.R.E.P)   No orders of the defined types were placed in this encounter.  Patient Instructions  Medication Instructions:  Your physician recommends that you continue on your current medications as directed. Please refer to the Current Medication list given to you today.   *If you need a refill on your cardiac medications before your next appointment, please call your pharmacy*  Lab Work: NONE  Testing/Procedures: NONE   Follow-Up: At [facility], you and your health needs are our priority.  As part of our continuing mission to provide you with exceptional heart care, we have created designated Provider Care Teams.  These Care Teams include your primary Cardiologist (physician) and Advanced Practice Providers (APPs -  Physician Assistants and Nurse Practitioners) who all work together to provide you with the care you need, when you need it.  We recommend signing up for the patient portal called "MyChart".  Sign up information is provided on this After Visit Summary.  MyChart is used to connect with patients for Virtual Visits (Telemedicine).  Patients are able to view lab/test  results, encounter notes, upcoming appointments, etc.  Non-urgent messages can be sent to your provider as well.   To learn more about what you can do with MyChart, go to [email or web address].    Your next appointment:    6 MONTHS  The format for your next appointment:   In Person  Provider:   Gillian Shields, NP   DR [facility] IN 1 YEAR   Other Instructions YOU HAVE BEEN REFERRED TO PREP       I,Breanna Adamick,acting as a scribe for Chilton Si, MD.,have documented all relevant documentation on the behalf of Chilton Si, MD,as directed by  Chilton Si, MD while in the presence of Chilton Si, MD.   I, Kenrick Pore C. Duke Salvia, MD have reviewed all documentation for this visit.  The documentation of the exam, diagnosis, procedures, and orders on 06/01/2022 are all accurate and complete.   Signed, Chilton Si, MD  06/01/2022 5:34 PM    [facility]

## 2022-06-02 DIAGNOSIS — Z23 Encounter for immunization: Secondary | ICD-10-CM | POA: Diagnosis not present

## 2022-06-02 DIAGNOSIS — I679 Cerebrovascular disease, unspecified: Secondary | ICD-10-CM | POA: Diagnosis not present

## 2022-06-02 DIAGNOSIS — E7800 Pure hypercholesterolemia, unspecified: Secondary | ICD-10-CM | POA: Diagnosis not present

## 2022-06-02 DIAGNOSIS — Z1331 Encounter for screening for depression: Secondary | ICD-10-CM | POA: Diagnosis not present

## 2022-06-02 DIAGNOSIS — K219 Gastro-esophageal reflux disease without esophagitis: Secondary | ICD-10-CM | POA: Diagnosis not present

## 2022-06-02 DIAGNOSIS — Z0000 Encounter for general adult medical examination without abnormal findings: Secondary | ICD-10-CM | POA: Diagnosis not present

## 2022-06-02 DIAGNOSIS — M797 Fibromyalgia: Secondary | ICD-10-CM | POA: Diagnosis not present

## 2022-06-02 DIAGNOSIS — I739 Peripheral vascular disease, unspecified: Secondary | ICD-10-CM | POA: Diagnosis not present

## 2022-06-02 DIAGNOSIS — I10 Essential (primary) hypertension: Secondary | ICD-10-CM | POA: Diagnosis not present

## 2022-06-02 DIAGNOSIS — J449 Chronic obstructive pulmonary disease, unspecified: Secondary | ICD-10-CM | POA: Diagnosis not present

## 2022-06-05 ENCOUNTER — Telehealth: Payer: Self-pay

## 2022-06-05 NOTE — Telephone Encounter (Signed)
VMT to pt requesting call back to discuss referral to [facility]

## 2022-06-15 ENCOUNTER — Encounter (HOSPITAL_BASED_OUTPATIENT_CLINIC_OR_DEPARTMENT_OTHER): Payer: Self-pay

## 2022-06-16 NOTE — Progress Notes (Signed)
Carelink Summary Report / Loop Recorder 

## 2022-06-17 ENCOUNTER — Telehealth: Payer: Self-pay | Admitting: Pulmonary Disease

## 2022-06-17 ENCOUNTER — Telehealth: Payer: Self-pay | Admitting: Cardiovascular Disease

## 2022-06-17 NOTE — Telephone Encounter (Signed)
Pt is requesting call back to see if it will be okay to take her CPAP machine, but not her oxygen. Requesting call back.

## 2022-06-17 NOTE — Telephone Encounter (Signed)
Returned call to patient who is planning an upcoming trip overseas.  Pt has questions about traveling with oxygen and whether or not she can go without her oxygen.  Instructed patient to contact her pulmonary provider who prescribed her oxygen. Pt agreed to call her pulmonary MD ([facility]).  Jim Like MHA RN CCM

## 2022-06-17 NOTE — Telephone Encounter (Signed)
Called patient and she states that she is going out of town on December 12 and will be gone for 10 day. She states its [place] the [place]. She is currently on cpap machine with 2l of o2 bled in.   Sir she is asking if we can do an order for her to either rent a portable concentrator for her to take with her cpap.   I think she will also need a note for the airplane if this is okay with you  Please advise sir

## 2022-06-22 ENCOUNTER — Ambulatory Visit (INDEPENDENT_AMBULATORY_CARE_PROVIDER_SITE_OTHER): Payer: Medicare HMO

## 2022-06-22 DIAGNOSIS — I6340 Cerebral infarction due to embolism of unspecified cerebral artery: Secondary | ICD-10-CM

## 2022-06-23 LAB — CUP PACEART REMOTE DEVICE CHECK
Date Time Interrogation Session: 20231127000627
Implantable Pulse Generator Implant Date: 20210730

## 2022-06-27 ENCOUNTER — Other Ambulatory Visit: Payer: Self-pay | Admitting: Pulmonary Disease

## 2022-07-03 DIAGNOSIS — R944 Abnormal results of kidney function studies: Secondary | ICD-10-CM | POA: Diagnosis not present

## 2022-07-23 ENCOUNTER — Ambulatory Visit (INDEPENDENT_AMBULATORY_CARE_PROVIDER_SITE_OTHER): Payer: Medicare HMO

## 2022-07-23 DIAGNOSIS — I6340 Cerebral infarction due to embolism of unspecified cerebral artery: Secondary | ICD-10-CM | POA: Diagnosis not present

## 2022-07-23 LAB — CUP PACEART REMOTE DEVICE CHECK
Date Time Interrogation Session: 20231228000646
Implantable Pulse Generator Implant Date: 20210730

## 2022-07-31 ENCOUNTER — Ambulatory Visit (HOSPITAL_COMMUNITY)
Admission: RE | Admit: 2022-07-31 | Discharge: 2022-07-31 | Disposition: A | Discharge status: Home / Self Care | Payer: Medicare HMO | Source: Ambulatory Visit | Attending: Physician Assistant | Admitting: Physician Assistant

## 2022-07-31 ENCOUNTER — Encounter (HOSPITAL_COMMUNITY): Payer: Self-pay | Admitting: Physician Assistant

## 2022-07-31 VITALS — BP 116/70 | HR 54 | Ht 59.0 in | Wt 201.0 lb

## 2022-07-31 DIAGNOSIS — R944 Abnormal results of kidney function studies: Secondary | ICD-10-CM | POA: Diagnosis not present

## 2022-07-31 DIAGNOSIS — I2510 Atherosclerotic heart disease of native coronary artery without angina pectoris: Secondary | ICD-10-CM | POA: Insufficient documentation

## 2022-07-31 DIAGNOSIS — E669 Obesity, unspecified: Secondary | ICD-10-CM | POA: Diagnosis not present

## 2022-07-31 DIAGNOSIS — Z6841 Body Mass Index (BMI) 40.0 and over, adult: Secondary | ICD-10-CM | POA: Insufficient documentation

## 2022-07-31 DIAGNOSIS — I739 Peripheral vascular disease, unspecified: Secondary | ICD-10-CM | POA: Insufficient documentation

## 2022-07-31 DIAGNOSIS — I10 Essential (primary) hypertension: Secondary | ICD-10-CM | POA: Insufficient documentation

## 2022-07-31 DIAGNOSIS — Z87898 Personal history of other specified conditions: Secondary | ICD-10-CM | POA: Insufficient documentation

## 2022-07-31 DIAGNOSIS — Z7901 Long term (current) use of anticoagulants: Secondary | ICD-10-CM | POA: Insufficient documentation

## 2022-07-31 DIAGNOSIS — R001 Bradycardia, unspecified: Secondary | ICD-10-CM | POA: Diagnosis not present

## 2022-07-31 DIAGNOSIS — Z87891 Personal history of nicotine dependence: Secondary | ICD-10-CM | POA: Insufficient documentation

## 2022-07-31 DIAGNOSIS — Z8673 Personal history of transient ischemic attack (TIA), and cerebral infarction without residual deficits: Secondary | ICD-10-CM | POA: Diagnosis not present

## 2022-07-31 DIAGNOSIS — Z95828 Presence of other vascular implants and grafts: Secondary | ICD-10-CM | POA: Diagnosis not present

## 2022-07-31 DIAGNOSIS — Z79899 Other long term (current) drug therapy: Secondary | ICD-10-CM | POA: Insufficient documentation

## 2022-07-31 DIAGNOSIS — D6869 Other thrombophilia: Secondary | ICD-10-CM | POA: Diagnosis not present

## 2022-07-31 DIAGNOSIS — I480 Paroxysmal atrial fibrillation: Secondary | ICD-10-CM | POA: Diagnosis not present

## 2022-07-31 DIAGNOSIS — G4733 Obstructive sleep apnea (adult) (pediatric): Secondary | ICD-10-CM | POA: Insufficient documentation

## 2022-07-31 DIAGNOSIS — Z713 Dietary counseling and surveillance: Secondary | ICD-10-CM | POA: Insufficient documentation

## 2022-07-31 NOTE — Progress Notes (Signed)
Primary Care Physician: Merri Brunette, MD Primary Cardiologist: Dr Duke Salvia  Primary Electrophysiologist: Dr Elberta Fortis Referring Physician: Dr Creig Hines Norma Scott is a 72 y.o. female with a history of HTN, OSA, HLD, PVD, TIA, CVA, COPD, CAD, atrial fibrillation who presents for follow up in the [facility].  The patient was initially diagnosed with atrial fibrillation on ILR which was placed for cyrptogenic stroke in 2021. The device clinic received an alert on 12/17/21 for 7 afib episodes, longest 4.5 hours. She was asymptomatic during those episodes. Patient has a CHADS2VASC score of 6.   On follow up today, patient reports that she has done well since her last visit. ILR shows 0% afib burden. No bleeding issues on anticoagulation.   Today, she denies symptoms of palpitations, chest pain, shortness of breath, orthopnea, PND, dizziness, presyncope, syncope, bleeding, or neurologic sequela. The patient is tolerating medications without difficulties and is otherwise without complaint today.    Atrial Fibrillation Risk Factors:  she does have symptoms or diagnosis of sleep apnea. she is compliant with CPAP therapy. she does not have a history of rheumatic fever.   she has a BMI of Body mass index is 40.6 kg/m.Marland Kitchen Filed Weights   07/31/22 1135  Weight: 91.2 kg    Family History  Problem Relation Age of Onset   Stroke Mother    Hypertension Mother    Hyperlipidemia Mother    Seizures Mother        Grand mal seizures   Emphysema Mother    Diabetes Father    Heart disease Father        Heart Disease before age 84   Breast cancer Maternal Aunt    Heart disease Paternal Uncle    Heart attack Paternal Uncle    Stroke Maternal Grandmother    Heart Problems Maternal Grandmother    Cancer Maternal Grandfather        stomach cancer   Heart attack Maternal Grandfather    Cancer Paternal Grandmother 50   Heart disease Paternal Grandfather     Heart attack Paternal Grandfather    Deep vein thrombosis Brother    Asthma Brother      Atrial Fibrillation Management history:  Previous antiarrhythmic drugs: none Previous cardioversions: none Previous ablations: none CHADS2VASC score: 6 Anticoagulation history: Eliquis   Past Medical History:  Diagnosis Date   Allergy    allergic rhinitis   Arteriosclerotic coronary artery disease 05/21/2015   Asthma    CAD (coronary artery disease)    patient says no problems   dr Williemae Natter Katrinka Blazing pcp   Cervical disc displacement    x2   COPD (chronic obstructive pulmonary disease) (HCC)    Dyspnea    Esophageal reflux    Essential hypertension 05/21/2015   Exertional dyspnea 03/01/2022   Family history of adverse reaction to anesthesia    mother hard to wake and affected b/p   Fibromyalgia    Gall stones    Generalized anxiety disorder    Headache    Hyperlipidemia    Lumbar disc herniation    L5-S1   Mini stroke    Peripheral vascular disease (HCC)    Sleep apnea    Stroke [facility])    July 2021   TIA (transient ischemic attack) 05/21/2015   Past Surgical History:  Procedure Laterality Date   ABDOMINAL AORTOGRAM W/LOWER EXTREMITY N/A 01/15/2021   Procedure: ABDOMINAL AORTOGRAM W/LOWER EXTREMITY;  Surgeon: Leonie Douglas, MD;  Location:  [facility];  Service: Cardiovascular;  Laterality: N/A;   APPLICATION OF WOUND VAC Right 02/14/2021   Procedure: APPLICATION OF WOUND VAC;  Surgeon: Cherre Robins, MD;  Location: [place];  Service: Vascular;  Laterality: Right;   BACK SURGERY  06/01/2012   lumbar   CAROTID ENDARTERECTOMY  02/01/2009   Right   CERVICAL FUSION     1995, 1996   CHOLECYSTECTOMY     CHOLECYSTECTOMY     ECTOPIC PREGNANCY SURGERY  48   ENDARTERECTOMY FEMORAL Right 02/14/2021   Procedure: RIGHT FEMORAL EXTENDED ENDARTERECTOMY WITH VEIN PATCH ANGIOPLASTY USING GREATER SAPHENOUS VEIN HARVEST AND PROFUNDOPLASTY;  Surgeon: Cherre Robins, MD;  Location: [facility]  OR;  Service: Vascular;  Laterality: Right;   ERCP N/A 11/20/2015   Procedure: ENDOSCOPIC RETROGRADE CHOLANGIOPANCREATOGRAPHY (ERCP);  Surgeon: Arta Silence, MD;  Location: [facility];  Service: Endoscopy;  Laterality: N/A;   EUS N/A 11/20/2015   Procedure: UPPER ENDOSCOPIC ULTRASOUND (EUS) RADIAL;  Surgeon: Arta Silence, MD;  Location: [facility];  Service: Endoscopy;  Laterality: N/A;   INSERTION OF ILIAC STENT Right 02/14/2021   Procedure: INSERTION OF RIGHT ILIAC ARTERY STENT;  Surgeon: Cherre Robins, MD;  Location: [facility];  Service: Vascular;  Laterality: Right;   LOOP RECORDER INSERTION N/A 02/23/2020   Procedure: LOOP RECORDER INSERTION;  Surgeon: Constance Haw, MD;  Location: [facility];  Service: Cardiovascular;  Laterality: N/A;   TUBAL LIGATION      Current Outpatient Medications  Medication Sig Dispense Refill   albuterol (PROVENTIL HFA) 108 (90 Base) MCG/ACT inhaler Inhale 1 puff into the lungs every 6 (six) hours as needed for wheezing or shortness of breath. 1 Inhaler 0   amLODipine (NORVASC) 10 MG tablet Take 10 mg by mouth daily.     aspirin EC 81 MG EC tablet Take 1 tablet (81 mg total) by mouth daily. Swallow whole. 30 tablet 11   atorvastatin (LIPITOR) 80 MG tablet Take 1 tablet (80 mg total) by mouth at bedtime. 90 tablet 3   clonazePAM (KLONOPIN) 0.5 MG tablet Take 0.5 mg by mouth 3 (three) times daily as needed for anxiety.     ELIQUIS 5 MG TABS tablet Take 1 tablet by mouth twice daily 60 tablet 4   FLUoxetine (PROZAC) 20 MG capsule Take 60 mg by mouth daily.      fluticasone (FLONASE) 50 MCG/ACT nasal spray Use 2 spray(s) in each nostril once daily 16 g 0   furosemide (LASIX) 20 MG tablet Take 40 mg by mouth daily.     gabapentin (NEURONTIN) 300 MG capsule Take 300-600 mg by mouth See admin instructions. Take 300 mg in the morning, 300 mg at noon, and 600 mg at bedtime     ipratropium (ATROVENT) 0.03 % nasal spray USE 2 SPRAY(S) IN EACH NOSTRIL  EVERY 12 HOURS 30 mL 0   lisinopril (ZESTRIL) 30 MG tablet Take 30 mg by mouth daily.      loratadine (CLARITIN) 10 MG tablet Take 10 mg by mouth daily.     metoprolol tartrate (LOPRESSOR) 25 MG tablet Take 25 mg by mouth 2 (two) times daily.     olopatadine (PATANOL) 0.1 % ophthalmic solution Place 1 drop into both eyes daily as needed (dry/itchy eyes).     omeprazole (PRILOSEC) 20 MG capsule 1 capsule 30 minutes before morning meal     tiZANidine (ZANAFLEX) 4 MG tablet Take 4 mg by mouth every 8 (eight) hours as needed for muscle  spasms.     TRELEGY ELLIPTA 100-62.5-25 MCG/INH AEPB Inhale 1 puff into the lungs daily.     No current facility-administered medications for this encounter.    Allergies  Allergen Reactions   Bupropion Other (See Comments)    Hallucinations    Septra [Sulfamethoxazole-Trimethoprim] Nausea And Vomiting   Sulfa Antibiotics Nausea And Vomiting   Zithromax [Azithromycin] Hives   Crestor [Rosuvastatin]     Other reaction(s): elevated liver functions   Cymbalta [Duloxetine Hcl] Other (See Comments)    fatigue   Zoloft [Sertraline]     Other reaction(s): fatigue    Social History   Socioeconomic History   Marital status: Divorced    Spouse name: Not on file   Number of children: Not on file   Years of education: Not on file   Highest education level: Not on file  Occupational History   Not on file  Tobacco Use   Smoking status: Former    Packs/day: 0.50    Years: 45.00    Total pack years: 22.50    Types: Cigarettes    Quit date: 11/18/2020    Years since quitting: 1.6   Smokeless tobacco: Never   Tobacco comments:    Former smoker 12/19/21  Vaping Use   Vaping Use: Never used  Substance and Sexual Activity   Alcohol use: Not Currently    Comment: 2 times a year   Drug use: No   Sexual activity: Not on file  Other Topics Concern   Not on file  Social History Narrative   Epworth Sleepiness Scale = 10 (as of 05/21/2015)   Social  Determinants of Health   Financial Resource Strain: Not on file  Food Insecurity: Not on file  Transportation Needs: Not on file  Physical Activity: Not on file  Stress: Not on file  Social Connections: Not on file  Intimate Partner Violence: Not on file     ROS- All systems are reviewed and negative except as per the HPI above.  Physical Exam: Vitals:   07/31/22 1135  BP: 116/70  Pulse: (!) 54  Weight: 91.2 kg  Height: 4\' 11"  (1.499 m)    GEN- The patient is a well appearing female, alert and oriented x 3 today.   HEENT-head normocephalic, atraumatic, sclera clear, conjunctiva pink, hearing intact, trachea midline. Lungs- Clear to ausculation bilaterally, normal work of breathing Heart- Regular rate and rhythm, no murmurs, rubs or gallops  GI- soft, NT, ND, + BS Extremities- no clubbing, cyanosis, or edema MS- no significant deformity or atrophy Skin- no rash or lesion Psych- euthymic mood, full affect Neuro- strength and sensation are intact   Wt Readings from Last 3 Encounters:  07/31/22 91.2 kg  06/01/22 92.7 kg  03/09/22 92.5 kg    EKG today demonstrates  SB Vent. rate 54 BPM PR interval 162 ms QRS duration 74 ms QT/QTcB 432/409 ms  Echo 02/22/20 demonstrated  1. Left ventricular ejection fraction, by estimation, is 60 to 65%. The  left ventricle has normal function. The left ventricle has no regional  wall motion abnormalities. There is moderate left ventricular hypertrophy. Left ventricular diastolic parameters are indeterminate.   2. Right ventricular systolic function is normal. The right ventricular  size is normal. Tricuspid regurgitation signal is inadequate for assessing PA pressure.   3. The mitral valve is grossly normal. No evidence of mitral valve  regurgitation.   4. The aortic valve was not well visualized. Aortic valve regurgitation  is not visualized. Mild  to moderate aortic valve sclerosis/calcification  is present, without any evidence  of aortic stenosis.   5. The inferior vena cava is normal in size with greater than 50%  respiratory variability, suggesting right atrial pressure of 3 mmHg.   Epic records are reviewed at length today  CHA2DS2-VASc Score = 6  The patient's score is based upon: CHF History: 0 HTN History: 1 Diabetes History: 0 Stroke History: 2 Vascular Disease History: 1 Age Score: 1 Gender Score: 1       ASSESSMENT AND PLAN: 1. Paroxysmal Atrial Fibrillation (ICD10:  I48.0) The patient's CHA2DS2-VASc score is 6, indicating a 9.7% annual risk of stroke.   ILR shows 0% afib burden with only one brief episode lasting 4 minutes. Continue Eliquis 5 mg BID Continue Lopressor 25 mg BID  2. Secondary Hypercoagulable State (ICD10:  D68.69) The patient is at significant risk for stroke/thromboembolism based upon her CHA2DS2-VASc Score of 6.  Continue Apixaban (Eliquis).   3. Obesity Body mass index is 40.6 kg/m. Lifestyle modification was discussed and encouraged including regular physical activity and weight reduction.  4. HTN Stable, no changes today.  5. CAD Coronary calcification on CT On statin No anginal symptoms.  6. PVD S/p R CEA S/p R femoral endarterectomy and R common iliac artery stenting. Followed by Dr Stanford Breed.  7. OSA Followed by Dr Ander Slade. Encouraged compliance with CPAP therapy. She plans to reach out to Dr Ander Slade about her CPAP settings.    Follow up in the AF clinic in one year.    [facility] [address] [place], [place] [address] [phone] 07/31/2022 11:51 AM

## 2022-08-04 NOTE — Progress Notes (Signed)
Carelink Summary Report / Loop Recorder 

## 2022-08-07 DIAGNOSIS — I4891 Unspecified atrial fibrillation: Secondary | ICD-10-CM | POA: Diagnosis not present

## 2022-08-07 DIAGNOSIS — I10 Essential (primary) hypertension: Secondary | ICD-10-CM | POA: Diagnosis not present

## 2022-08-07 DIAGNOSIS — J449 Chronic obstructive pulmonary disease, unspecified: Secondary | ICD-10-CM | POA: Diagnosis not present

## 2022-08-07 DIAGNOSIS — E7800 Pure hypercholesterolemia, unspecified: Secondary | ICD-10-CM | POA: Diagnosis not present

## 2022-08-11 NOTE — Progress Notes (Signed)
[facility] Summary Report / Loop Recorder

## 2022-08-25 ENCOUNTER — Ambulatory Visit: Payer: Medicare HMO

## 2022-08-25 DIAGNOSIS — I6340 Cerebral infarction due to embolism of unspecified cerebral artery: Secondary | ICD-10-CM

## 2022-08-25 LAB — CUP PACEART REMOTE DEVICE CHECK
Date Time Interrogation Session: 20240128001006
Implantable Pulse Generator Implant Date: 20210730

## 2022-09-22 NOTE — Progress Notes (Signed)
Carelink Summary Report / Loop Recorder 

## 2022-09-23 ENCOUNTER — Ambulatory Visit: Payer: Medicare HMO

## 2022-09-23 DIAGNOSIS — D631 Anemia in chronic kidney disease: Secondary | ICD-10-CM | POA: Diagnosis not present

## 2022-09-23 DIAGNOSIS — I6340 Cerebral infarction due to embolism of unspecified cerebral artery: Secondary | ICD-10-CM | POA: Diagnosis not present

## 2022-09-23 DIAGNOSIS — N1832 Chronic kidney disease, stage 3b: Secondary | ICD-10-CM | POA: Diagnosis not present

## 2022-09-23 DIAGNOSIS — I480 Paroxysmal atrial fibrillation: Secondary | ICD-10-CM | POA: Diagnosis not present

## 2022-09-23 DIAGNOSIS — N189 Chronic kidney disease, unspecified: Secondary | ICD-10-CM | POA: Diagnosis not present

## 2022-09-23 DIAGNOSIS — N2581 Secondary hyperparathyroidism of renal origin: Secondary | ICD-10-CM | POA: Diagnosis not present

## 2022-09-23 DIAGNOSIS — I129 Hypertensive chronic kidney disease with stage 1 through stage 4 chronic kidney disease, or unspecified chronic kidney disease: Secondary | ICD-10-CM | POA: Diagnosis not present

## 2022-09-23 DIAGNOSIS — I639 Cerebral infarction, unspecified: Secondary | ICD-10-CM | POA: Diagnosis not present

## 2022-09-23 LAB — CUP PACEART REMOTE DEVICE CHECK
Date Time Interrogation Session: 20240228000652
Implantable Pulse Generator Implant Date: 20210730

## 2022-09-24 ENCOUNTER — Other Ambulatory Visit (HOSPITAL_COMMUNITY): Payer: Self-pay | Admitting: Physician Assistant

## 2022-09-28 ENCOUNTER — Ambulatory Visit: Payer: Medicare HMO

## 2022-10-02 ENCOUNTER — Other Ambulatory Visit: Payer: Self-pay | Admitting: Nephrology

## 2022-10-02 DIAGNOSIS — I129 Hypertensive chronic kidney disease with stage 1 through stage 4 chronic kidney disease, or unspecified chronic kidney disease: Secondary | ICD-10-CM

## 2022-10-02 DIAGNOSIS — N1832 Chronic kidney disease, stage 3b: Secondary | ICD-10-CM

## 2022-10-22 DIAGNOSIS — I4891 Unspecified atrial fibrillation: Secondary | ICD-10-CM | POA: Diagnosis not present

## 2022-10-22 DIAGNOSIS — J449 Chronic obstructive pulmonary disease, unspecified: Secondary | ICD-10-CM | POA: Diagnosis not present

## 2022-10-22 DIAGNOSIS — E7800 Pure hypercholesterolemia, unspecified: Secondary | ICD-10-CM | POA: Diagnosis not present

## 2022-10-22 DIAGNOSIS — I10 Essential (primary) hypertension: Secondary | ICD-10-CM | POA: Diagnosis not present

## 2022-10-26 ENCOUNTER — Ambulatory Visit (INDEPENDENT_AMBULATORY_CARE_PROVIDER_SITE_OTHER): Payer: Medicare HMO

## 2022-10-26 DIAGNOSIS — I6340 Cerebral infarction due to embolism of unspecified cerebral artery: Secondary | ICD-10-CM | POA: Diagnosis not present

## 2022-10-27 ENCOUNTER — Ambulatory Visit
Admission: RE | Admit: 2022-10-27 | Discharge: 2022-10-27 | Disposition: A | Discharge status: Home / Self Care | Payer: Medicare HMO | Source: Ambulatory Visit | Attending: Nephrology | Admitting: Nephrology

## 2022-10-27 DIAGNOSIS — N1832 Chronic kidney disease, stage 3b: Secondary | ICD-10-CM

## 2022-10-27 DIAGNOSIS — I129 Hypertensive chronic kidney disease with stage 1 through stage 4 chronic kidney disease, or unspecified chronic kidney disease: Secondary | ICD-10-CM

## 2022-10-27 DIAGNOSIS — N189 Chronic kidney disease, unspecified: Secondary | ICD-10-CM | POA: Diagnosis not present

## 2022-10-27 LAB — CUP PACEART REMOTE DEVICE CHECK
Date Time Interrogation Session: 20240401001141
Implantable Pulse Generator Implant Date: 20210730

## 2022-10-28 NOTE — Progress Notes (Signed)
Carelink Summary Report / Loop Recorder 

## 2022-11-02 ENCOUNTER — Ambulatory Visit: Payer: Medicare HMO

## 2022-11-24 ENCOUNTER — Ambulatory Visit (HOSPITAL_BASED_OUTPATIENT_CLINIC_OR_DEPARTMENT_OTHER): Payer: Medicare HMO | Admitting: Family

## 2022-11-24 ENCOUNTER — Other Ambulatory Visit: Payer: Self-pay | Admitting: *Deleted

## 2022-11-24 ENCOUNTER — Encounter (HOSPITAL_BASED_OUTPATIENT_CLINIC_OR_DEPARTMENT_OTHER): Payer: Self-pay | Admitting: Family

## 2022-11-24 VITALS — BP 168/80 | HR 61 | Ht 59.0 in | Wt 194.4 lb

## 2022-11-24 DIAGNOSIS — I10 Essential (primary) hypertension: Secondary | ICD-10-CM

## 2022-11-24 DIAGNOSIS — N1832 Chronic kidney disease, stage 3b: Secondary | ICD-10-CM

## 2022-11-24 DIAGNOSIS — I4891 Unspecified atrial fibrillation: Secondary | ICD-10-CM | POA: Diagnosis not present

## 2022-11-24 DIAGNOSIS — I739 Peripheral vascular disease, unspecified: Secondary | ICD-10-CM

## 2022-11-24 DIAGNOSIS — E7800 Pure hypercholesterolemia, unspecified: Secondary | ICD-10-CM | POA: Diagnosis not present

## 2022-11-24 DIAGNOSIS — J449 Chronic obstructive pulmonary disease, unspecified: Secondary | ICD-10-CM | POA: Diagnosis not present

## 2022-11-24 MED ORDER — AMLODIPINE BESYLATE 5 MG PO TABS: 5.0000 mg | ORAL_TABLET | Freq: Every day | ORAL | 0 refills | Status: DC

## 2022-11-24 MED ORDER — AMLODIPINE BESYLATE 5 MG PO TABS: 5.0000 mg | ORAL_TABLET | Freq: Every day | ORAL | 1 refills | Status: DC

## 2022-11-24 NOTE — Progress Notes (Signed)
Office Visit    Patient Name: Norma Scott Date of Encounter: 11/24/2022  PCP:  Merri Brunette, MD   [facility]  Cardiologist:  Chilton Si, MD  Advanced Practice Provider:  No care team member to display Electrophysiologist:  None      Chief Complaint    Norma Scott is a 72 y.o. female presents today for hypertension follow up.    Past Medical History    Past Medical History:  Diagnosis Date   Allergy    allergic rhinitis   Arteriosclerotic coronary artery disease 05/21/2015   Asthma    CAD (coronary artery disease)    patient says no problems   dr Williemae Natter Katrinka Blazing pcp   Cervical disc displacement    x2   COPD (chronic obstructive pulmonary disease) (HCC)    Dyspnea    Esophageal reflux    Essential hypertension 05/21/2015   Exertional dyspnea 03/01/2022   Family history of adverse reaction to anesthesia    mother hard to wake and affected b/p   Fibromyalgia    Gall stones    Generalized anxiety disorder    Headache    Hyperlipidemia    Lumbar disc herniation    L5-S1   Mini stroke    Peripheral vascular disease (HCC)    Sleep apnea    Stroke [facility])    July 2021   TIA (transient ischemic attack) 05/21/2015   Past Surgical History:  Procedure Laterality Date   ABDOMINAL AORTOGRAM W/LOWER EXTREMITY N/A 01/15/2021   Procedure: ABDOMINAL AORTOGRAM W/LOWER EXTREMITY;  Surgeon: Leonie Douglas, MD;  Location: [facility] INVASIVE CV LAB;  Service: Cardiovascular;  Laterality: N/A;   APPLICATION OF WOUND VAC Right 02/14/2021   Procedure: APPLICATION OF WOUND VAC;  Surgeon: Leonie Douglas, MD;  Location: [facility] OR;  Service: Vascular;  Laterality: Right;   BACK SURGERY  06/01/2012   lumbar   CAROTID ENDARTERECTOMY  02/01/2009   Right   CERVICAL FUSION     1995, 1996   CHOLECYSTECTOMY     CHOLECYSTECTOMY     ECTOPIC PREGNANCY SURGERY  89   ENDARTERECTOMY FEMORAL Right 02/14/2021   Procedure: RIGHT FEMORAL EXTENDED  ENDARTERECTOMY WITH VEIN PATCH ANGIOPLASTY USING GREATER SAPHENOUS VEIN HARVEST AND PROFUNDOPLASTY;  Surgeon: Leonie Douglas, MD;  Location: [facility] OR;  Service: Vascular;  Laterality: Right;   ERCP N/A 11/20/2015   Procedure: ENDOSCOPIC RETROGRADE CHOLANGIOPANCREATOGRAPHY (ERCP);  Surgeon: Willis Modena, MD;  Location: [facility];  Service: Endoscopy;  Laterality: N/A;   EUS N/A 11/20/2015   Procedure: UPPER ENDOSCOPIC ULTRASOUND (EUS) RADIAL;  Surgeon: Willis Modena, MD;  Location: [facility] ENDOSCOPY;  Service: Endoscopy;  Laterality: N/A;   INSERTION OF ILIAC STENT Right 02/14/2021   Procedure: INSERTION OF RIGHT ILIAC ARTERY STENT;  Surgeon: Leonie Douglas, MD;  Location: [facility] OR;  Service: Vascular;  Laterality: Right;   LOOP RECORDER INSERTION N/A 02/23/2020   Procedure: LOOP RECORDER INSERTION;  Surgeon: Regan Lemming, MD;  Location: [facility] INVASIVE CV LAB;  Service: Cardiovascular;  Laterality: N/A;   TUBAL LIGATION      Allergies  Allergies  Allergen Reactions   Bupropion Other (See Comments)    Hallucinations    Septra [Sulfamethoxazole-Trimethoprim] Nausea And Vomiting   Sulfa Antibiotics Nausea And Vomiting   Zithromax [Azithromycin] Hives   Crestor [Rosuvastatin]     Other reaction(s): elevated liver functions   Cymbalta [Duloxetine Hcl] Other (See Comments)    fatigue   Zoloft [Sertraline]  Other reaction(s): fatigue    History of Present Illness    Norma Scott is a 72 y.o. female with a hx of CAD, carotid artery occlusion, TIA, PVD s/p R CEA and s/p femoral endarterectomy and right common iliac artery stenting, hypertension, hyperlipidemia, fibromyalgia, COPD, OSA on CPAP, paroxysmal atrial fibrillation last seen 06/01/2022.  Initially diagnosed with atrial fibrillation on ILR which was placed for cryptogenic stroke in 2021.  Initial up for atrial fibrillation 12/17/2021 with episode lasting as long as 4.5 hours.  She was asymptomatic.  Due to CHA2DS2-VASc of 6  she was started on Eliquis 5 mg twice daily.  Aspirin continued due to PAD.  Myoview 02/2022 due to dyspnea negative for ischemia.  Carotid Dopplers 03/2022 were stable.  Last seen 06/01/2022 doing overall well from a cardiac perspective.  She did have 2 falls while getting out of the bed and had a pulmonary follow-up with primary care.  Blood pressure was at goal.  She presents today for follow-up. Enjoyed a trip and cruise and [place] No chest pain, pressure, tightness. Reports exertional dyspnea which is longstanding. Notes when she is reclined in her chair will also feel a bit short of breath and has to scoot up. No PND. Notes her CPAP machine has been having difficulties machine and is calling the company to have it repaired. She is prescribed oxygen to use with her CPAP in the evening.   She is not taking her Amlodipine nor Prilosec. She is not sure who stopped her Amlodipine. Notes her blood pressure has been running high. She has not had swelling in a few months. She is taking Furosemide 40mg  daily. Reports no recent swelling. A few days since having Klonazepam as it running late in the mail.  EKGs/Labs/Other Studies Reviewed:   The following studies were reviewed today: Cardiac Studies & Procedures     STRESS TESTS  MYOCARDIAL PERFUSION IMAGING 03/13/2022  Narrative   The study is normal. Findings are consistent with no prior ischemia and no prior myocardial infarction. The study is low risk.   No ST deviation was noted.   LV perfusion is normal. There is no evidence of ischemia. There is no evidence of infarction.   Left ventricular function is normal. Nuclear stress EF: 52 %. The left ventricular ejection fraction is mildly decreased (45-54%). End diastolic cavity size is normal. End systolic cavity size is normal.   Prior study available for comparison from 01/21/2015.  Images not availabel for comparison.   ECHOCARDIOGRAM  ECHOCARDIOGRAM COMPLETE  02/22/2020  Narrative ECHOCARDIOGRAM REPORT    Patient Name:   Norma Scott [facility] Date of Exam: 02/22/2020 Medical Rec #:  [id number]              Height:       60.0 in Accession #:    [id number]             Weight:       187.4 lb Date of Birth:  03-13-1951              BSA:          1.816 m Patient Age:    68 years               BP:           112/45 mmHg Patient Gender: F                      HR:  64 bpm. Exam Location:  Inpatient  Procedure: 2D Echo, Cardiac Doppler and Color Doppler  Indications:    TIA  History:        Patient has prior history of Echocardiogram examinations, most recent 01/19/2015. COPD; Risk Factors:Current Smoker, Hypertension and Dyslipidemia.  Sonographer:    Ross Ludwig RDCS (AE) Referring Phys: [phone] DEBBY CROSLEY   Sonographer Comments: Patient is morbidly obese and suboptimal parasternal window. IMPRESSIONS   1. Left ventricular ejection fraction, by estimation, is 60 to 65%. The left ventricle has normal function. The left ventricle has no regional wall motion abnormalities. There is moderate left ventricular hypertrophy. Left ventricular diastolic parameters are indeterminate. 2. Right ventricular systolic function is normal. The right ventricular size is normal. Tricuspid regurgitation signal is inadequate for assessing PA pressure. 3. The mitral valve is grossly normal. No evidence of mitral valve regurgitation. 4. The aortic valve was not well visualized. Aortic valve regurgitation is not visualized. Mild to moderate aortic valve sclerosis/calcification is present, without any evidence of aortic stenosis. 5. The inferior vena cava is normal in size with greater than 50% respiratory variability, suggesting right atrial pressure of 3 mmHg.  FINDINGS Left Ventricle: Left ventricular ejection fraction, by estimation, is 60 to 65%. The left ventricle has normal function. The left ventricle has no regional wall motion abnormalities. The left  ventricular internal cavity size was normal in size. There is moderate left ventricular hypertrophy. Left ventricular diastolic parameters are indeterminate.  Right Ventricle: The right ventricular size is normal. Right vetricular wall thickness was not assessed. Right ventricular systolic function is normal. Tricuspid regurgitation signal is inadequate for assessing PA pressure.  Left Atrium: Left atrial size was normal in size.  Right Atrium: Right atrial size was normal in size.  Pericardium: Trivial pericardial effusion is present. Presence of pericardial fat pad.  Mitral Valve: The mitral valve is grossly normal. No evidence of mitral valve regurgitation. MV peak gradient, 3.8 mmHg. The mean mitral valve gradient is 1.0 mmHg.  Tricuspid Valve: The tricuspid valve is grossly normal. Tricuspid valve regurgitation is not demonstrated.  Aortic Valve: The aortic valve was not well visualized. Aortic valve regurgitation is not visualized. Mild to moderate aortic valve sclerosis/calcification is present, without any evidence of aortic stenosis. Aortic valve mean gradient measures 4.0 mmHg. Aortic valve peak gradient measures 8.5 mmHg. Aortic valve area, by VTI measures 2.01 cm.  Pulmonic Valve: The pulmonic valve was not well visualized. Pulmonic valve regurgitation is not visualized.  Aorta: The aortic root is normal in size and structure.  Venous: The inferior vena cava is normal in size with greater than 50% respiratory variability, suggesting right atrial pressure of 3 mmHg.  IAS/Shunts: The interatrial septum was not well visualized.   LEFT VENTRICLE PLAX 2D LVIDd:         4.40 cm  Diastology LVIDs:         3.00 cm  LV e' lateral:   11.30 cm/s LV PW:         1.50 cm  LV E/e' lateral: 6.8 LV IVS:        1.40 cm  LV e' medial:    5.00 cm/s LVOT diam:     2.00 cm  LV E/e' medial:  15.3 LV SV:         66 LV SV Index:   37 LVOT Area:     3.14 cm   RIGHT VENTRICLE              IVC  RV Basal diam:  3.00 cm     IVC diam: 1.40 cm RV S prime:     13.20 cm/s TAPSE (M-mode): 2.0 cm  LEFT ATRIUM             Index       RIGHT ATRIUM           Index LA diam:        3.30 cm 1.82 cm/m  RA Area:     13.70 cm LA Vol (A2C):   42.0 ml 23.13 ml/m RA Volume:   29.90 ml  16.47 ml/m LA Vol (A4C):   32.6 ml 17.95 ml/m LA Biplane Vol: 37.4 ml 20.60 ml/m AORTIC VALVE AV Area (Vmax):    2.01 cm AV Area (Vmean):   2.04 cm AV Area (VTI):     2.01 cm AV Vmax:           146.00 cm/s AV Vmean:          96.600 cm/s AV VTI:            0.329 m AV Peak Grad:      8.5 mmHg AV Mean Grad:      4.0 mmHg LVOT Vmax:         93.30 cm/s LVOT Vmean:        62.600 cm/s LVOT VTI:          0.211 m LVOT/AV VTI ratio: 0.64  AORTA Ao Root diam: 3.20 cm  MITRAL VALVE MV Area (PHT): 2.48 cm    SHUNTS MV Peak grad:  3.8 mmHg    Systemic VTI:  0.21 m MV Mean grad:  1.0 mmHg    Systemic Diam: 2.00 cm MV Vmax:       0.97 m/s MV Vmean:      52.1 cm/s MV Decel Time: 306 msec MV E velocity: 76.30 cm/s MV A velocity: 91.70 cm/s MV E/A ratio:  0.83  Epifanio Lesches MD Electronically signed by Epifanio Lesches MD Signature Date/Time: 02/22/2020/10:33:19 AM    Final              EKG:  EKG is not ordered today.  Recent Labs: 01/23/2022: Hemoglobin 12.9; Platelets 234  Recent Lipid Panel    Component Value Date/Time   CHOL 107 02/15/2021 0446   TRIG 39 02/15/2021 0446   HDL 38 (L) 02/15/2021 0446   CHOLHDL 2.8 02/15/2021 0446   VLDL 8 02/15/2021 0446   LDLCALC 61 02/15/2021 0446    Risk Assessment/Calculations:   CHA2DS2-VASc Score = 6   This indicates a 9.7% annual risk of stroke. The patient's score is based upon: CHF History: 0 HTN History: 1 Diabetes History: 0 Stroke History: 2 Vascular Disease History: 1 Age Score: 1 Gender Score: 1   Home Medications   Current Meds  Medication Sig   albuterol (PROVENTIL HFA) 108 (90 Base) MCG/ACT inhaler Inhale 1  puff into the lungs every 6 (six) hours as needed for wheezing or shortness of breath.   apixaban (ELIQUIS) 5 MG TABS tablet Take 1 tablet by mouth twice daily   aspirin EC 81 MG EC tablet Take 1 tablet (81 mg total) by mouth daily. Swallow whole.   atorvastatin (LIPITOR) 80 MG tablet Take 1 tablet (80 mg total) by mouth at bedtime.   clonazePAM (KLONOPIN) 0.5 MG tablet Take 0.5 mg by mouth 3 (three) times daily as needed for anxiety.   FLUoxetine (PROZAC) 20 MG capsule Take 60 mg by mouth daily.  fluticasone (FLONASE) 50 MCG/ACT nasal spray Use 2 spray(s) in each nostril once daily   furosemide (LASIX) 20 MG tablet Take 40 mg by mouth daily.   gabapentin (NEURONTIN) 300 MG capsule Take 300-600 mg by mouth See admin instructions. Take 300 mg in the morning, 300 mg at noon, and 600 mg at bedtime   ipratropium (ATROVENT) 0.03 % nasal spray USE 2 SPRAY(S) IN EACH NOSTRIL EVERY 12 HOURS   lisinopril (ZESTRIL) 30 MG tablet Take 30 mg by mouth daily.    loratadine (CLARITIN) 10 MG tablet Take 10 mg by mouth daily.   metoprolol tartrate (LOPRESSOR) 25 MG tablet Take 25 mg by mouth 2 (two) times daily.   tiZANidine (ZANAFLEX) 4 MG tablet Take 4 mg by mouth every 8 (eight) hours as needed for muscle spasms.   TRELEGY ELLIPTA 100-62.5-25 MCG/INH AEPB Inhale 1 puff into the lungs daily.   [DISCONTINUED] amLODipine (NORVASC) 5 MG tablet Take 1 tablet (5 mg total) by mouth daily.    Review of Systems      All other systems reviewed and are otherwise negative except as noted above.  Physical Exam    VS:  BP (!) 168/80   Pulse 61   Ht 4\' 11"  (1.499 m)   Wt 194 lb 6.4 oz (88.2 kg)   SpO2 92%   BMI 39.26 kg/m  , BMI Body mass index is 39.26 kg/m.  Wt Readings from Last 3 Encounters:  11/24/22 194 lb 6.4 oz (88.2 kg)  07/31/22 201 lb (91.2 kg)  06/01/22 204 lb 6.4 oz (92.7 kg)     GEN: Well nourished, well developed, in no acute distress. Overweight.  HEENT: normal. Neck: Supple, no JVD,  carotid bruits, or masses. Cardiac: RRR, no murmurs, rubs, or gallops. No clubbing, cyanosis, edema Radials/PT 2+ and equal bilaterally.  Respiratory:  Respirations regular and unlabored, clear to auscultation bilaterally. GI: Soft, nontender, nondistended. MS: No deformity or atrophy. Skin: Warm and dry, no rash. Neuro:  Strength and sensation are intact. Psych: Normal affect.  Assessment & Plan    HTN- BP not at goal <130/80. Her Amlodipine has been discontinued, unclear why or by what provider. Will resume at Amlodipine 5mg  dose in case the 10mg  dose was causing swelling. Discussed to monitor BP at home at least 2 hours after medications and sitting for 5-10 minutes. Continue Lisinopril 30mg  QD, Furosemide 40mg  QD, Metoprolol 25mg  BID. If future control needed could transition Metoprolol to Carvedilol. 10/27/2022 renal duplex with no stenosis.  PAF/hypercoagulable state- NSR by auscultation today. Denies palpitations. Continue Metoprolol tartrate 25mg  BID and Eliquis 5mg  BID. Does not meet dose reduction criteria. Denies bleeding complications.  Eliquis monitoring: 09/23/21 creatinine 1.27, Hb 13.3.  Aortic atherosclerosis/HDL, LDL goal less than 70-05/2022 total cholesterol 137, triglycerides 97, HDL 58, LDL 60.  Continue atorvastatin 80 mg daily.  Bilateral carotid stenosis-s/p right CEA.  Carotid Doppler 03/2022 with right ICA 1 to 39% stenosis, left ICA 40-59% stenosis.  Repeat study in 1 year already ordered.  Continue optimal lipid and blood pressure control.  PAD-known carotid stenosis and prior femoral endarterectomy. Follows with Dr. Lenell Antu. Continue lipid control, as above.   CKD - Follows with Dr. Signe Colt. Careful titration of diuretic and antihypertensive.    OSA - Follows with Dr. Wynona Neat. CPAP compliance encouraged.   HYPERTENSION CONTROL Vitals:   11/24/22 1320 11/24/22 1348  BP: (!) 174/68 (!) 168/80    The patient's blood pressure is elevated above target today.  In  order  to address the patient's elevated BP: A new medication was prescribed today.; Follow up with general cardiology has been recommended.        Disposition: Follow up  in 2-3 months  with Chilton Si, MD or APP.  Signed, Alver Sorrow, NP 11/24/2022, 1:55 PM [facility]

## 2022-11-24 NOTE — Patient Instructions (Addendum)
Medication Instructions:  Your physician has recommended you make the following change in your medication:   START Amlodipine 5mg  daily in the evening  *If you need a refill on your cardiac medications before your next appointment, please call your pharmacy*  Lab Work/Testing/Procedures: None ordered today.  Follow-Up: At [facility], you and your health needs are our priority.  As part of our continuing mission to provide you with exceptional heart care, we have created designated Provider Care Teams.  These Care Teams include your primary Cardiologist (physician) and Advanced Practice Providers (APPs -  Physician Assistants and Nurse Practitioners) who all work together to provide you with the care you need, when you need it.  We recommend signing up for the patient portal called "MyChart".  Sign up information is provided on this After Visit Summary.  MyChart is used to connect with patients for Virtual Visits (Telemedicine).  Patients are able to view lab/test results, encounter notes, upcoming appointments, etc.  Non-urgent messages can be sent to your provider as well.   To learn more about what you can do with MyChart, go to [email or web address].    Your next appointment:   3 month(s)  Provider:   Chilton Si, MD or Gillian Shields, NP    Other Instructions  If you get lightheaded or have a low blood pressure with systolic blood pressure (the top number) less than 100 recommend eating and drinking something as it can help to naturally raise your blood pressure.   Heart Healthy Diet Recommendations: A low-salt diet is recommended. Meats should be grilled, baked, or boiled. Avoid fried foods. Focus on lean protein sources like fish or chicken with vegetables and fruits. The American Heart Association is a Chief Technology Officer!  American Heart Association Diet and Lifeystyle Recommendations   Exercise recommendations: The American Heart Association recommends 150 minutes  of moderate intensity exercise weekly. Try 30 minutes of moderate intensity exercise 4-5 times per week. This could include walking, jogging, or swimming.  Tips to Measure your Blood Pressure Correctly  Here's what you can do to ensure a correct reading:  Don't drink a caffeinated beverage or smoke during the 30 minutes before the test.  Sit quietly for five minutes before the test begins.  During the measurement, sit in a chair with your feet on the floor and your arm supported so your elbow is at about heart level.  The inflatable part of the cuff should completely cover at least 80% of your upper arm, and the cuff should be placed on bare skin, not over a shirt.  Don't talk during the measurement.  Blood pressure categories  Blood pressure category SYSTOLIC (upper number)  DIASTOLIC (lower number)  Normal Less than 120 mm Hg and Less than 80 mm Hg  Elevated 120-129 mm Hg and Less than 80 mm Hg  High blood pressure: Stage 1 hypertension 130-139 mm Hg or 80-89 mm Hg  High blood pressure: Stage 2 hypertension 140 mm Hg or higher or 90 mm Hg or higher  Hypertensive crisis (consult your doctor immediately) Higher than 180 mm Hg and/or Higher than 120 mm Hg  Source: American Heart Association and American Stroke Association. For more on getting your blood pressure under control, buy Controlling Your Blood Pressure, a Special Health Report from [facility].  Blood Pressure Log   Date   Time  Blood Pressure  Example: Nov 1 9 AM 124/78

## 2022-11-27 ENCOUNTER — Telehealth: Payer: Self-pay

## 2022-11-27 NOTE — Telephone Encounter (Signed)
Called to discuss PREP program, explained program; offered classes at [place] and [facility], states Yehuda Budd is opposite side of town and afraid she would get lost going to [place]; she wants to think about it, has my name and number and will call me when she is ready to attend.

## 2022-11-30 ENCOUNTER — Ambulatory Visit (INDEPENDENT_AMBULATORY_CARE_PROVIDER_SITE_OTHER): Payer: Medicare HMO

## 2022-11-30 DIAGNOSIS — I6340 Cerebral infarction due to embolism of unspecified cerebral artery: Secondary | ICD-10-CM | POA: Diagnosis not present

## 2022-11-30 LAB — CUP PACEART REMOTE DEVICE CHECK
Date Time Interrogation Session: 20240506000347
Implantable Pulse Generator Implant Date: 20210730

## 2022-12-01 ENCOUNTER — Ambulatory Visit: Payer: Medicare HMO | Admitting: Vascular Surgery

## 2022-12-01 ENCOUNTER — Ambulatory Visit (HOSPITAL_COMMUNITY)
Admission: RE | Admit: 2022-12-01 | Discharge: 2022-12-01 | Disposition: A | Discharge status: Home / Self Care | Payer: Medicare HMO | Source: Ambulatory Visit | Attending: Surgery | Admitting: Surgery

## 2022-12-01 DIAGNOSIS — M797 Fibromyalgia: Secondary | ICD-10-CM | POA: Diagnosis not present

## 2022-12-01 DIAGNOSIS — I679 Cerebrovascular disease, unspecified: Secondary | ICD-10-CM | POA: Diagnosis not present

## 2022-12-01 DIAGNOSIS — I739 Peripheral vascular disease, unspecified: Secondary | ICD-10-CM | POA: Diagnosis not present

## 2022-12-01 DIAGNOSIS — N1832 Chronic kidney disease, stage 3b: Secondary | ICD-10-CM | POA: Diagnosis not present

## 2022-12-01 DIAGNOSIS — I4891 Unspecified atrial fibrillation: Secondary | ICD-10-CM | POA: Diagnosis not present

## 2022-12-01 DIAGNOSIS — J449 Chronic obstructive pulmonary disease, unspecified: Secondary | ICD-10-CM | POA: Diagnosis not present

## 2022-12-01 DIAGNOSIS — I10 Essential (primary) hypertension: Secondary | ICD-10-CM | POA: Diagnosis not present

## 2022-12-01 DIAGNOSIS — F411 Generalized anxiety disorder: Secondary | ICD-10-CM | POA: Diagnosis not present

## 2022-12-01 DIAGNOSIS — E7800 Pure hypercholesterolemia, unspecified: Secondary | ICD-10-CM | POA: Diagnosis not present

## 2022-12-01 LAB — VAS US ABI WITH/WO TBI
Left ABI: 0.63
Right ABI: 1.07

## 2022-12-03 NOTE — Progress Notes (Signed)
Carelink Summary Report / Loop Recorder 

## 2022-12-07 ENCOUNTER — Ambulatory Visit: Payer: Medicare HMO

## 2022-12-19 ENCOUNTER — Other Ambulatory Visit: Payer: Self-pay | Admitting: Cardiovascular Disease

## 2022-12-22 ENCOUNTER — Encounter: Payer: Self-pay | Admitting: Vascular Surgery

## 2022-12-22 ENCOUNTER — Ambulatory Visit: Payer: Medicare HMO | Admitting: Vascular Surgery

## 2022-12-22 VITALS — BP 114/62 | HR 62 | Temp 98.1°F | Resp 20 | Ht 59.0 in | Wt 191.2 lb

## 2022-12-22 DIAGNOSIS — I739 Peripheral vascular disease, unspecified: Secondary | ICD-10-CM

## 2022-12-22 NOTE — Progress Notes (Signed)
POST OPERATIVE OFFICE NOTE    CC:  F/u for surgery  HPI:  This is a 72 y.o. female who is s/p   1) right great saphenous vein harvest 2) extended right femoral endarterectomy and profundoplasty with greater saphenous vein patch angioplasty 3) right common iliac artery stenting (8 x 39 mm VBX)  on 02/14/21    Pt returns today for follow up.  Pt states She is doing well and walking without symptoms of claudication, rest pain or non healing wounds.  12/22/22: Patient returns for surveillance of peripheral arterial disease.  She is doing well overall.  She reports persistent sciatica discomfort which limits her ability to walk.  She does not describe claudication, rest pain, or ischemic ulceration.  Allergies  Allergen Reactions   Bupropion Other (See Comments)    Hallucinations    Septra [Sulfamethoxazole-Trimethoprim] Nausea And Vomiting   Sulfa Antibiotics Nausea And Vomiting   Zithromax [Azithromycin] Hives   Crestor [Rosuvastatin]     Other reaction(s): elevated liver functions   Cymbalta [Duloxetine Hcl] Other (See Comments)    fatigue   Zoloft [Sertraline]     Other reaction(s): fatigue    Current Outpatient Medications  Medication Sig Dispense Refill   albuterol (PROVENTIL HFA) 108 (90 Base) MCG/ACT inhaler Inhale 1 puff into the lungs every 6 (six) hours as needed for wheezing or shortness of breath. 1 Inhaler 0   amLODipine (NORVASC) 5 MG tablet Take 1 tablet (5 mg total) by mouth daily. 90 tablet 1   apixaban (ELIQUIS) 5 MG TABS tablet Take 1 tablet by mouth twice daily 60 tablet 6   aspirin EC 81 MG EC tablet Take 1 tablet (81 mg total) by mouth daily. Swallow whole. 30 tablet 11   atorvastatin (LIPITOR) 80 MG tablet Take 1 tablet (80 mg total) by mouth at bedtime. 90 tablet 3   clonazePAM (KLONOPIN) 0.5 MG tablet Take 0.5 mg by mouth 3 (three) times daily as needed for anxiety.     FLUoxetine (PROZAC) 20 MG capsule Take 60 mg by mouth daily.      fluticasone  (FLONASE) 50 MCG/ACT nasal spray Use 2 spray(s) in each nostril once daily 16 g 0   furosemide (LASIX) 20 MG tablet Take 40 mg by mouth daily.     gabapentin (NEURONTIN) 300 MG capsule Take 300-600 mg by mouth See admin instructions. Take 300 mg in the morning, 300 mg at noon, and 600 mg at bedtime     ipratropium (ATROVENT) 0.03 % nasal spray USE 2 SPRAY(S) IN EACH NOSTRIL EVERY 12 HOURS 30 mL 0   lisinopril (ZESTRIL) 30 MG tablet Take 30 mg by mouth daily.      loratadine (CLARITIN) 10 MG tablet Take 10 mg by mouth daily.     metoprolol tartrate (LOPRESSOR) 25 MG tablet Take 25 mg by mouth 2 (two) times daily.     tiZANidine (ZANAFLEX) 4 MG tablet Take 4 mg by mouth every 8 (eight) hours as needed for muscle spasms.     TRELEGY ELLIPTA 100-62.5-25 MCG/INH AEPB Inhale 1 puff into the lungs daily.     No current facility-administered medications for this visit.     ROS:  See HPI  Physical Exam: Incision:  Healing very well without breakdown, drainage or erythema Extremities: doppler signals brisk in all three tibials   +-------+-----------+-----------+------------+------------+  ABI/TBIToday's ABIToday's TBIPrevious ABIPrevious TBI  +-------+-----------+-----------+------------+------------+  Right 1.07       0.77       0.93  0.55          +-------+-----------+-----------+------------+------------+  Left  0.63       0.56       0.57        0.28          +-------+-----------+-----------+------------+------------+   Assessment/Plan:  This is a 72 y.o. female who is s/p: 1) right great saphenous vein harvest 2) extended right femoral endarterectomy and profundoplasty with greater saphenous vein patch angioplasty 3) right common iliac artery stenting (8 x 39 mm VBX)  Doing well overall.  She has a chronic left iliac artery occlusion that is currently asymptomatic.  Follow up in 1 year with ABI.  Rande Brunt. Lenell Antu, MD [facility] Phone Number: [phone] 12/22/2022 8:03 AM

## 2022-12-28 ENCOUNTER — Other Ambulatory Visit: Payer: Self-pay

## 2022-12-28 DIAGNOSIS — I739 Peripheral vascular disease, unspecified: Secondary | ICD-10-CM

## 2022-12-30 NOTE — Progress Notes (Signed)
Carelink Summary Report / Loop Recorder 

## 2023-01-04 ENCOUNTER — Ambulatory Visit (INDEPENDENT_AMBULATORY_CARE_PROVIDER_SITE_OTHER): Payer: Medicare HMO

## 2023-01-04 DIAGNOSIS — I6340 Cerebral infarction due to embolism of unspecified cerebral artery: Secondary | ICD-10-CM

## 2023-01-04 LAB — CUP PACEART REMOTE DEVICE CHECK
Date Time Interrogation Session: 20240610000401
Implantable Pulse Generator Implant Date: 20210730

## 2023-01-07 ENCOUNTER — Other Ambulatory Visit: Payer: Self-pay | Admitting: Acute Care

## 2023-01-07 DIAGNOSIS — Z87891 Personal history of nicotine dependence: Secondary | ICD-10-CM

## 2023-01-07 DIAGNOSIS — Z122 Encounter for screening for malignant neoplasm of respiratory organs: Secondary | ICD-10-CM

## 2023-01-11 ENCOUNTER — Ambulatory Visit: Payer: Medicare HMO

## 2023-01-21 DIAGNOSIS — D631 Anemia in chronic kidney disease: Secondary | ICD-10-CM | POA: Diagnosis not present

## 2023-01-21 DIAGNOSIS — N1832 Chronic kidney disease, stage 3b: Secondary | ICD-10-CM | POA: Diagnosis not present

## 2023-01-21 DIAGNOSIS — I639 Cerebral infarction, unspecified: Secondary | ICD-10-CM | POA: Diagnosis not present

## 2023-01-21 DIAGNOSIS — N2581 Secondary hyperparathyroidism of renal origin: Secondary | ICD-10-CM | POA: Diagnosis not present

## 2023-01-21 DIAGNOSIS — I480 Paroxysmal atrial fibrillation: Secondary | ICD-10-CM | POA: Diagnosis not present

## 2023-01-21 DIAGNOSIS — N189 Chronic kidney disease, unspecified: Secondary | ICD-10-CM | POA: Diagnosis not present

## 2023-01-21 DIAGNOSIS — I129 Hypertensive chronic kidney disease with stage 1 through stage 4 chronic kidney disease, or unspecified chronic kidney disease: Secondary | ICD-10-CM | POA: Diagnosis not present

## 2023-01-26 ENCOUNTER — Encounter: Payer: Self-pay | Admitting: Acute Care

## 2023-01-27 NOTE — Progress Notes (Signed)
Carelink Summary Report / Loop Recorder 

## 2023-02-04 LAB — CUP PACEART REMOTE DEVICE CHECK
Date Time Interrogation Session: 20240711000323
Implantable Pulse Generator Implant Date: 20210730

## 2023-02-05 ENCOUNTER — Ambulatory Visit
Admission: RE | Admit: 2023-02-05 | Discharge: 2023-02-05 | Disposition: A | Discharge status: Home / Self Care | Payer: Medicare HMO | Source: Ambulatory Visit | Attending: Family Medicine | Admitting: Family Medicine

## 2023-02-05 DIAGNOSIS — Z87891 Personal history of nicotine dependence: Secondary | ICD-10-CM

## 2023-02-05 DIAGNOSIS — Z122 Encounter for screening for malignant neoplasm of respiratory organs: Secondary | ICD-10-CM

## 2023-02-08 ENCOUNTER — Ambulatory Visit (INDEPENDENT_AMBULATORY_CARE_PROVIDER_SITE_OTHER): Payer: Medicare HMO

## 2023-02-08 DIAGNOSIS — I6340 Cerebral infarction due to embolism of unspecified cerebral artery: Secondary | ICD-10-CM | POA: Diagnosis not present

## 2023-02-09 ENCOUNTER — Other Ambulatory Visit: Payer: Self-pay | Admitting: Acute Care

## 2023-02-09 DIAGNOSIS — Z122 Encounter for screening for malignant neoplasm of respiratory organs: Secondary | ICD-10-CM

## 2023-02-09 DIAGNOSIS — Z87891 Personal history of nicotine dependence: Secondary | ICD-10-CM

## 2023-02-15 ENCOUNTER — Ambulatory Visit: Payer: Medicare HMO

## 2023-02-16 ENCOUNTER — Other Ambulatory Visit: Payer: Self-pay | Admitting: Family Medicine

## 2023-02-16 DIAGNOSIS — Z1231 Encounter for screening mammogram for malignant neoplasm of breast: Secondary | ICD-10-CM

## 2023-02-17 ENCOUNTER — Ambulatory Visit
Admission: RE | Admit: 2023-02-17 | Discharge: 2023-02-17 | Disposition: A | Discharge status: Home / Self Care | Payer: Medicare HMO | Source: Ambulatory Visit | Attending: Family Medicine | Admitting: Family Medicine

## 2023-02-17 DIAGNOSIS — Z1231 Encounter for screening mammogram for malignant neoplasm of breast: Secondary | ICD-10-CM

## 2023-02-23 ENCOUNTER — Encounter (HOSPITAL_BASED_OUTPATIENT_CLINIC_OR_DEPARTMENT_OTHER): Payer: Self-pay

## 2023-02-23 ENCOUNTER — Emergency Department (HOSPITAL_BASED_OUTPATIENT_CLINIC_OR_DEPARTMENT_OTHER): Payer: Medicare HMO | Admitting: Radiology

## 2023-02-23 ENCOUNTER — Other Ambulatory Visit: Payer: Self-pay

## 2023-02-23 ENCOUNTER — Ambulatory Visit (HOSPITAL_BASED_OUTPATIENT_CLINIC_OR_DEPARTMENT_OTHER): Payer: Medicare HMO | Admitting: Family

## 2023-02-23 ENCOUNTER — Emergency Department (HOSPITAL_BASED_OUTPATIENT_CLINIC_OR_DEPARTMENT_OTHER)
Admission: EM | Admit: 2023-02-23 | Discharge: 2023-02-23 | Disposition: A | Discharge status: Home / Self Care | Payer: Medicare HMO | Attending: Emergency Medicine | Admitting: Emergency Medicine

## 2023-02-23 ENCOUNTER — Encounter (HOSPITAL_BASED_OUTPATIENT_CLINIC_OR_DEPARTMENT_OTHER): Payer: Self-pay | Admitting: Family

## 2023-02-23 VITALS — BP 80/60 | HR 58 | Ht 59.0 in | Wt 192.0 lb

## 2023-02-23 DIAGNOSIS — I10 Essential (primary) hypertension: Secondary | ICD-10-CM | POA: Diagnosis not present

## 2023-02-23 DIAGNOSIS — J439 Emphysema, unspecified: Secondary | ICD-10-CM | POA: Diagnosis not present

## 2023-02-23 DIAGNOSIS — J189 Pneumonia, unspecified organism: Secondary | ICD-10-CM | POA: Insufficient documentation

## 2023-02-23 DIAGNOSIS — R001 Bradycardia, unspecified: Secondary | ICD-10-CM | POA: Diagnosis not present

## 2023-02-23 DIAGNOSIS — Z7901 Long term (current) use of anticoagulants: Secondary | ICD-10-CM | POA: Insufficient documentation

## 2023-02-23 DIAGNOSIS — I739 Peripheral vascular disease, unspecified: Secondary | ICD-10-CM | POA: Diagnosis not present

## 2023-02-23 DIAGNOSIS — R918 Other nonspecific abnormal finding of lung field: Secondary | ICD-10-CM | POA: Diagnosis not present

## 2023-02-23 DIAGNOSIS — E785 Hyperlipidemia, unspecified: Secondary | ICD-10-CM | POA: Diagnosis not present

## 2023-02-23 DIAGNOSIS — Z7982 Long term (current) use of aspirin: Secondary | ICD-10-CM | POA: Insufficient documentation

## 2023-02-23 DIAGNOSIS — I480 Paroxysmal atrial fibrillation: Secondary | ICD-10-CM

## 2023-02-23 DIAGNOSIS — R42 Dizziness and giddiness: Secondary | ICD-10-CM | POA: Diagnosis present

## 2023-02-23 DIAGNOSIS — R55 Syncope and collapse: Secondary | ICD-10-CM | POA: Diagnosis not present

## 2023-02-23 DIAGNOSIS — J168 Pneumonia due to other specified infectious organisms: Secondary | ICD-10-CM | POA: Diagnosis not present

## 2023-02-23 DIAGNOSIS — R8290 Unspecified abnormal findings in urine: Secondary | ICD-10-CM | POA: Insufficient documentation

## 2023-02-23 DIAGNOSIS — I4891 Unspecified atrial fibrillation: Secondary | ICD-10-CM | POA: Insufficient documentation

## 2023-02-23 DIAGNOSIS — Z87891 Personal history of nicotine dependence: Secondary | ICD-10-CM | POA: Diagnosis not present

## 2023-02-23 DIAGNOSIS — I959 Hypotension, unspecified: Secondary | ICD-10-CM | POA: Insufficient documentation

## 2023-02-23 DIAGNOSIS — D6859 Other primary thrombophilia: Secondary | ICD-10-CM | POA: Diagnosis not present

## 2023-02-23 DIAGNOSIS — I25118 Atherosclerotic heart disease of native coronary artery with other forms of angina pectoris: Secondary | ICD-10-CM

## 2023-02-23 LAB — CBC
HCT: 40.5 % (ref 36.0–46.0)
Hemoglobin: 13.1 g/dL (ref 12.0–15.0)
MCH: 27.6 pg (ref 26.0–34.0)
MCHC: 32.3 g/dL (ref 30.0–36.0)
MCV: 85.3 fL (ref 80.0–100.0)
Platelets: 208 10*3/uL (ref 150–400)
RBC: 4.75 MIL/uL (ref 3.87–5.11)
RDW: 14.8 % (ref 11.5–15.5)
WBC: 8.6 10*3/uL (ref 4.0–10.5)
nRBC: 0 % (ref 0.0–0.2)

## 2023-02-23 LAB — URINALYSIS, ROUTINE W REFLEX MICROSCOPIC
Bilirubin Urine: NEGATIVE
Glucose, UA: NEGATIVE mg/dL
Hgb urine dipstick: NEGATIVE
Ketones, ur: NEGATIVE mg/dL
Nitrite: NEGATIVE
Protein, ur: NEGATIVE mg/dL
Specific Gravity, Urine: 1.014 (ref 1.005–1.030)
pH: 5.5 (ref 5.0–8.0)

## 2023-02-23 LAB — BASIC METABOLIC PANEL
Anion gap: 9 (ref 5–15)
BUN: 15 mg/dL (ref 8–23)
CO2: 24 mmol/L (ref 22–32)
Calcium: 9.2 mg/dL (ref 8.9–10.3)
Chloride: 103 mmol/L (ref 98–111)
Creatinine, Ser: 1.32 mg/dL — ABNORMAL HIGH (ref 0.44–1.00)
GFR, Estimated: 43 mL/min — ABNORMAL LOW (ref >60)
Glucose, Bld: 97 mg/dL (ref 70–99)
Potassium: 3.9 mmol/L (ref 3.5–5.1)
Sodium: 136 mmol/L (ref 135–145)

## 2023-02-23 LAB — TROPONIN I (HIGH SENSITIVITY): Troponin I (High Sensitivity): 5 ng/L (ref <18)

## 2023-02-23 LAB — BRAIN NATRIURETIC PEPTIDE: B Natriuretic Peptide: 327 pg/mL — ABNORMAL HIGH (ref 0.0–100.0)

## 2023-02-23 MED ORDER — SODIUM CHLORIDE 0.9 % IV SOLN
1.0000 g | Freq: Once | INTRAVENOUS | Status: AC
  Administered 2023-02-23: 1 g via INTRAVENOUS
  Filled 2023-02-23: qty 10

## 2023-02-23 MED ORDER — SODIUM CHLORIDE 0.9 % IV SOLN
100.0000 mg | Freq: Once | INTRAVENOUS | Status: AC
  Administered 2023-02-23: 100 mg via INTRAVENOUS
  Filled 2023-02-23: qty 100

## 2023-02-23 MED ORDER — DOXYCYCLINE HYCLATE 100 MG PO CAPS: 100.0000 mg | ORAL_CAPSULE | Freq: Two times a day (BID) | ORAL | 0 refills | Status: AC

## 2023-02-23 MED ORDER — SODIUM CHLORIDE 0.9 % IV BOLUS
1000.0000 mL | Freq: Once | INTRAVENOUS | Status: AC
  Administered 2023-02-23: 1000 mL via INTRAVENOUS

## 2023-02-23 NOTE — Patient Instructions (Signed)
Medication Instructions:  Your physician recommends that you continue on your current medications as directed. Please refer to the Current Medication list given to you today.  *If you need a refill on your cardiac medications before your next appointment, please call your pharmacy*  Follow-Up: At [facility], you and your health needs are our priority.  As part of our continuing mission to provide you with exceptional heart care, we have created designated Provider Care Teams.  These Care Teams include your primary Cardiologist (physician) and Advanced Practice Providers (APPs -  Physician Assistants and Nurse Practitioners) who all work together to provide you with the care you need, when you need it.  We recommend signing up for the patient portal called "MyChart".  Sign up information is provided on this After Visit Summary.  MyChart is used to connect with patients for Virtual Visits (Telemedicine).  Patients are able to view lab/test results, encounter notes, upcoming appointments, etc.  Non-urgent messages can be sent to your provider as well.   To learn more about what you can do with MyChart, go to [email or web address].    Your next appointment:   Report to the Emergency Department

## 2023-02-23 NOTE — ED Triage Notes (Signed)
Pt coming from cardiac appt. For low blood pressure. Lowest reading 55/33 in the Dr. Isidore Moos. Pt stated she felt fatigue. Pt also reports feeling dizzy and lightheaded

## 2023-02-23 NOTE — ED Provider Notes (Signed)
[facility] EMERGENCY DEPARTMENT AT [facility] Provider Note   CSN: [id number] Arrival date & time: 02/23/23  1136     History  Chief Complaint  Patient presents with   Hypotension    Norma Scott is a 72 y.o. female presented emergency department with concern for low blood pressure and dizziness.  Patient reports that she was her normal state of health yesterday evening as well as this morning.  She said that she was walking into her cardiology office for an appointment today, she began to feel very lightheaded and dizzy.  She was noted to have low blood pressure at the cardiologist office, 80 systolic mmhg on manual, 50's systolic on automated cuff.  She advised to come to the ED.  She is feeling better now sitting in the emergency department.  Denies persistent headache.  She denies any chest pain or pressure today.  Denies difficulty breathing.  She denies black or dark or tarry stools.  She is on Eliquis for A-fib or a flutter.  Patient reports she has been drinking very little water this week, because she has to force himself to drink water.  She is prescribed Lasix as needed but does not take it regularly including this week.  HPI     Home Medications Prior to Admission medications   Medication Sig Start Date End Date Taking? Authorizing Provider  amLODipine (NORVASC) 5 MG tablet Take 1 tablet (5 mg total) by mouth daily. 11/24/22 02/23/23 Yes Alver Sorrow, NP  apixaban (ELIQUIS) 5 MG TABS tablet Take 1 tablet by mouth twice daily 09/24/22  Yes Fenton, Clint R, PA  aspirin EC 81 MG EC tablet Take 1 tablet (81 mg total) by mouth daily. Swallow whole. 02/24/20  Yes Pokhrel, Laxman, MD  atorvastatin (LIPITOR) 80 MG tablet TAKE 1 TABLET AT BEDTIME (DISCONTINUE ATORVASTATIN 40MG , NEW DOSE) 12/22/22  Yes Chilton Si, MD  calcium carbonate (OSCAL) 1500 (600 Ca) MG TABS tablet Take 1,500 mg by mouth daily.   Yes [provider]  clonazePAM (KLONOPIN)  0.5 MG tablet Take 0.5 mg by mouth 3 (three) times daily as needed for anxiety.   Yes [provider]  co-enzyme Q-10 30 MG capsule Take 30 mg by mouth daily.   Yes [provider]  doxycycline (VIBRAMYCIN) 100 MG capsule Take 1 capsule (100 mg total) by mouth 2 (two) times daily for 6 days. 02/24/23 03/02/23 Yes Gryphon Vanderveen, Kermit Balo, MD  famotidine (PEPCID) 20 MG tablet Take 20 mg by mouth every other day. Alternate with prilosec every other day   Yes [provider]  FLUoxetine (PROZAC) 20 MG capsule Take 60 mg by mouth daily.    Yes [provider]  fluticasone (FLONASE) 50 MCG/ACT nasal spray Use 2 spray(s) in each nostril once daily 06/29/22  Yes Dewald, Bettina Gavia, MD  furosemide (LASIX) 20 MG tablet Take 40 mg by mouth daily.   Yes [provider]  gabapentin (NEURONTIN) 300 MG capsule Take 300-600 mg by mouth See admin instructions. Take 300 mg in the morning, and 600 mg at bedtime 04/30/15  Yes [provider]  ipratropium (ATROVENT) 0.03 % nasal spray USE 2 SPRAY(S) IN EACH NOSTRIL EVERY 12 HOURS 04/17/22  Yes Martina Sinner, MD  lisinopril (ZESTRIL) 30 MG tablet Take 30 mg by mouth daily.  11/06/19  Yes [provider]  loratadine (CLARITIN) 10 MG tablet Take 10 mg by mouth daily.   Yes [provider]  metoprolol tartrate (LOPRESSOR) 25  MG tablet Take 25 mg by mouth 2 (two) times daily. 04/30/15  Yes [provider]  omeprazole (PRILOSEC) 20 MG capsule Take 20 mg by mouth every other day. Alternate with famotidine every other day   Yes [provider]  tiZANidine (ZANAFLEX) 4 MG tablet Take 4 mg by mouth every evening. 11/03/19  Yes [provider]  TRELEGY ELLIPTA 100-62.5-25 MCG/INH AEPB Inhale 1 puff into the lungs daily. 01/10/20  Yes [provider]  albuterol (PROVENTIL HFA) 108 (90 Base) MCG/ACT inhaler Inhale 1 puff into the lungs every 6 (six) hours as needed for wheezing or shortness  of breath. 07/04/16   Alison Murray, MD      Allergies    Bupropion, Septra [sulfamethoxazole-trimethoprim], Sulfa antibiotics, Zithromax [azithromycin], Crestor [rosuvastatin], Cymbalta [duloxetine hcl], and Zoloft [sertraline]    Review of Systems   Review of Systems  Physical Exam Updated Vital Signs BP 121/64   Pulse (!) 54   Temp 97.7 F (36.5 C) (Oral)   Resp 16   SpO2 96%  Physical Exam Constitutional:      General: She is not in acute distress. HENT:     Head: Normocephalic and atraumatic.     Comments: Tacky mucous membrane Eyes:     Conjunctiva/sclera: Conjunctivae normal.     Pupils: Pupils are equal, round, and reactive to light.  Cardiovascular:     Rate and Rhythm: Bradycardia present. Rhythm irregular.  Pulmonary:     Effort: Pulmonary effort is normal. No respiratory distress.     Comments: On home 2L O2 Abdominal:     General: There is no distension.     Tenderness: There is no abdominal tenderness.  Skin:    General: Skin is warm and dry.  Neurological:     General: No focal deficit present.     Mental Status: She is alert. Mental status is at baseline.  Psychiatric:        Mood and Affect: Mood normal.        Behavior: Behavior normal.     ED Results / Procedures / Treatments   Labs (all labs ordered are listed, but only abnormal results are displayed) Labs Reviewed  BASIC METABOLIC PANEL - Abnormal; Notable for the following components:      Result Value   Creatinine, Ser 1.32 (*)    GFR, Estimated 43 (*)    All other components within normal limits  BRAIN NATRIURETIC PEPTIDE - Abnormal; Notable for the following components:   B Natriuretic Peptide 327.0 (*)    All other components within normal limits  URINALYSIS, ROUTINE W REFLEX MICROSCOPIC - Abnormal; Notable for the following components:   Leukocytes,Ua LARGE (*)    Bacteria, UA RARE (*)    All other components within normal limits  URINE CULTURE  CBC  TROPONIN I (HIGH  SENSITIVITY)    EKG EKG Interpretation Date/Time:  Tuesday February 23 2023 11:53:35 EDT Ventricular Rate:  57 PR Interval:  155 QRS Duration:  89 QT Interval:  433 QTC Calculation: 422 R Axis:   67  Text Interpretation: Sinus rhythm Low voltage, precordial leads Confirmed by Alvester Chou [phone]) on 02/23/2023 12:22:44 PM  Radiology DG Chest 2 View  Result Date: 02/23/2023 CLINICAL DATA:  near syncope EXAM: CHEST - 2 VIEW COMPARISON:  02/21/2020. FINDINGS: There are multiple small heterogeneous opacities overlying the bilateral lungs, which are nonspecific but concerning for multi lobar pneumonia. Correlate clinically. Consider additional imaging, as clinically indicated. Bilateral lungs are otherwise clear.  Bilateral costophrenic angles are clear. Normal cardio-mediastinal silhouette. Loop recorder device noted overlying the left lower anterior chest wall. No acute osseous abnormalities. The soft tissues are within normal limits. IMPRESSION: 1. Multiple small heterogeneous opacities overlying the bilateral lungs, which are nonspecific but concerning for multi lobar pneumonia. Consider additional imaging, as clinically indicated. Electronically Signed   By: Jules Schick M.D.   On: 02/23/2023 13:18    Procedures Procedures    Medications Ordered in ED Medications  cefTRIAXone (ROCEPHIN) 1 g in sodium chloride 0.9 % 100 mL IVPB (has no administration in time range)  doxycycline (VIBRAMYCIN) 100 mg in sodium chloride 0.9 % 250 mL IVPB (has no administration in time range)  sodium chloride 0.9 % bolus 1,000 mL (0 mLs Intravenous Stopped 02/23/23 1330)    ED Course/ Medical Decision Making/ A&P Clinical Course as of 02/23/23 1539  Tue Feb 23, 2023  1531 BP 130's systolic, pt asymptomatic - rocephin and doxycycline for cross-coverage UTI possibly and PNA (vs scar tissue) -she is otherwise very much wanting to go home, and I do not see any emergent cause for medical admission.  No clear  evidence of sepsis.  I do advise she monitor her blood pressure she has a cuff at home, and that if her systolic pressures under 100 not to take her blood pressure medication for that round.  She has a son who lives with her could also help keep an eye on her.  Okay for discharge [MT]    Clinical Course User Index [MT] Ulus Hazen, Kermit Balo, MD                                 Medical Decision Making Amount and/or Complexity of Data Reviewed Labs: ordered. Radiology: ordered.  Risk Prescription drug management.   This patient presents to the ED with concern for near syncope, hypotension. This involves an extensive number of treatment options, and is a complaint that carries with it a high risk of complications and morbidity.  The differential diagnosis includes dehydration versus anemia versus arrhythmia versus other  Co-morbidities that complicate the patient evaluation: History of  emphysema, former smoking  External records from outside source obtained and reviewed including cardiology office evaluation note from earlier today  I ordered and personally interpreted labs.  The pertinent results include: Labs are baseline level including mildly elevated BNP.  Troponin is normal.  UA with large leukocytes, negative nitrites.  BMP and CBC within normal limits.  No leukocytosis  I ordered imaging studies including x-ray of the chest I independently visualized and interpreted imaging which showed patchy bilateral findings may be consistent multifocal pneumonia versus chronic pulmonary mass or scar tissue I agree with the radiologist interpretation  The patient was maintained on a cardiac monitor.  I personally viewed and interpreted the cardiac monitored which showed an underlying rhythm of: Sinus bradycardia first rate controlled a flutter  Per my interpretation the patient's ECG shows sinus bradycardia versus a flutter pattern, rate controlled, no acute ischemic findings  I ordered  medication including IV fluid for hydration and hypotension.  IV antibiotics for potential community pneumonia and or UTI.  Urine culture will be sent I have reviewed the patients home medicines and have made adjustments as needed  Test Considered: Lower suspicion for acute PE in the setting.  No indication for CT angiogram imaging.  After the interventions noted above, I reevaluated the patient and found that  they have: improved  Dispostion:  After consideration of the diagnostic results and the patients response to treatment, I feel that the patent would benefit from close outpatient follow-up.  Low suspicion at this time for sepsis or bacteremia.  Encouraged the patient continue stay hydrated and drink water at home, monitor home BP, discuss with PCP.         Final Clinical Impression(s) / ED Diagnoses Final diagnoses:  Hypotension, unspecified hypotension type  Pneumonia due to infectious organism, unspecified laterality, unspecified part of lung    Rx / DC Orders ED Discharge Orders          Ordered    doxycycline (VIBRAMYCIN) 100 MG capsule  2 times daily        02/23/23 1534              Terald Sleeper, MD 02/23/23 1540

## 2023-02-23 NOTE — ED Notes (Signed)
1L NS bolus hung at this time

## 2023-02-23 NOTE — Discharge Instructions (Addendum)
You are seen in the ER for lower blood pressure today.  You were given IV fluids which helped raise her blood pressure.  Your workup showed the possible signs of a pneumonia or possible urinary infection.  You are given antibiotics to treat for these, and will need to continue 6 more days of antibiotic beginning tomorrow morning.  This was sent to your pharmacy.  I would also recommend following up with your primary care provider's office this week for recheck of your vital signs and blood pressure.  Please monitor your blood pressure at home by checking twice a day.  If your systolic pressure, which is the top number, is under , please skip your amlodipine blood pressure medication.  We talked about the option of bringing you to observation in the hospital overnight, but you preferred to treat this as an outpatient at home.  If you do begin to feel dizzy or, lightheaded, have loss of consciousness, difficulty breathing, or any other serious medical concerns, please call 911 or return immediately to the ER.

## 2023-02-23 NOTE — Progress Notes (Signed)
Cardiology Office Note:  .   Date:  02/23/2023  ID:  Norma Scott, DOB 05-Jul-1951, MRN [id number] PCP: Merri Brunette, MD  Nephrologist: Dr. [facility] Providers Cardiologist:  Chilton Si, MD    History of Present Illness: .   Norma Scott is a 72 y.o. female with a hx of CAD, carotid artery occlusion, TIA, PVD s/p R CEA and s/p femoral endarterectomy and right common iliac artery stenting, hypertension, hyperlipidemia, fibromyalgia, COPD, OSA on CPAP, paroxysmal atrial fibrillation . Initially diagnosed with atrial fibrillation on ILR which was placed for cryptogenic stroke in 2021.    Initial visit for atrial fibrillation 12/17/2021 with episode lasting as long as 4.5 hours.  She was asymptomatic.  Due to CHA2DS2-VASc of 6 she was started on Eliquis 5 mg twice daily.  Aspirin continued due to PAD. Myoview 02/2022 due to dyspnea negative for ischemia.     Seen 06/01/2022 doing overall well from a cardiac perspective.  Last seen 11/24/22 with BP not at goal, Amlodipine was resumed. It was unclear why it had been stopped. She was referred to PREP exercise program but wished to think about it prior to participating.   She presents today for follow-up independently. She notes her son gave her medications including lisinopril, Metoprolol. Taking lasix only PRN, has not needed recently. She takes her Amlodipine in the evening. She did drive herself to her appointment and walking into the office started feeling lightheaded and woozy. She did not have breakfast this morning. Only a few sips of Sprite. Notes nephrology recently encouraged her to start iron supplementation.   Labs via [facility]: 01/21/23 creatinine 1.29, Hb 13.3, hct 40.5, Plt 222 12/2022 ferritin 20, TIBC 376, UIBC 324, Iron 52, Iron saturation 14% (low) 05/2022 LDL 60   ROS: Please see the history of present illness.    All other systems reviewed and are negative.   Studies Reviewed: .         Cardiac Studies & Procedures     STRESS TESTS  MYOCARDIAL PERFUSION IMAGING 03/13/2022  Narrative   The study is normal. Findings are consistent with no prior ischemia and no prior myocardial infarction. The study is low risk.   No ST deviation was noted.   LV perfusion is normal. There is no evidence of ischemia. There is no evidence of infarction.   Left ventricular function is normal. Nuclear stress EF: 52 %. The left ventricular ejection fraction is mildly decreased (45-54%). End diastolic cavity size is normal. End systolic cavity size is normal.   Prior study available for comparison from 01/21/2015.  Images not availabel for comparison.   ECHOCARDIOGRAM  ECHOCARDIOGRAM COMPLETE 02/22/2020  Narrative ECHOCARDIOGRAM REPORT    Patient Name:   Norma Scott [facility] Date of Exam: 02/22/2020 Medical Rec #:  [id number]              Height:       60.0 in Accession #:    [id number]             Weight:       187.4 lb Date of Birth:  May 14, 1951              BSA:          1.816 m Patient Age:    68 years               BP:           112/45 mmHg Patient Gender: F  HR:           64 bpm. Exam Location:  Inpatient  Procedure: 2D Echo, Cardiac Doppler and Color Doppler  Indications:    TIA  History:        Patient has prior history of Echocardiogram examinations, most recent 01/19/2015. COPD; Risk Factors:Current Smoker, Hypertension and Dyslipidemia.  Sonographer:    Ross Ludwig RDCS (AE) Referring Phys: [phone] DEBBY CROSLEY   Sonographer Comments: Patient is morbidly obese and suboptimal parasternal window. IMPRESSIONS   1. Left ventricular ejection fraction, by estimation, is 60 to 65%. The left ventricle has normal function. The left ventricle has no regional wall motion abnormalities. There is moderate left ventricular hypertrophy. Left ventricular diastolic parameters are indeterminate. 2. Right ventricular systolic function is normal. The right ventricular  size is normal. Tricuspid regurgitation signal is inadequate for assessing PA pressure. 3. The mitral valve is grossly normal. No evidence of mitral valve regurgitation. 4. The aortic valve was not well visualized. Aortic valve regurgitation is not visualized. Mild to moderate aortic valve sclerosis/calcification is present, without any evidence of aortic stenosis. 5. The inferior vena cava is normal in size with greater than 50% respiratory variability, suggesting right atrial pressure of 3 mmHg.  FINDINGS Left Ventricle: Left ventricular ejection fraction, by estimation, is 60 to 65%. The left ventricle has normal function. The left ventricle has no regional wall motion abnormalities. The left ventricular internal cavity size was normal in size. There is moderate left ventricular hypertrophy. Left ventricular diastolic parameters are indeterminate.  Right Ventricle: The right ventricular size is normal. Right vetricular wall thickness was not assessed. Right ventricular systolic function is normal. Tricuspid regurgitation signal is inadequate for assessing PA pressure.  Left Atrium: Left atrial size was normal in size.  Right Atrium: Right atrial size was normal in size.  Pericardium: Trivial pericardial effusion is present. Presence of pericardial fat pad.  Mitral Valve: The mitral valve is grossly normal. No evidence of mitral valve regurgitation. MV peak gradient, 3.8 mmHg. The mean mitral valve gradient is 1.0 mmHg.  Tricuspid Valve: The tricuspid valve is grossly normal. Tricuspid valve regurgitation is not demonstrated.  Aortic Valve: The aortic valve was not well visualized. Aortic valve regurgitation is not visualized. Mild to moderate aortic valve sclerosis/calcification is present, without any evidence of aortic stenosis. Aortic valve mean gradient measures 4.0 mmHg. Aortic valve peak gradient measures 8.5 mmHg. Aortic valve area, by VTI measures 2.01 cm.  Pulmonic Valve: The  pulmonic valve was not well visualized. Pulmonic valve regurgitation is not visualized.  Aorta: The aortic root is normal in size and structure.  Venous: The inferior vena cava is normal in size with greater than 50% respiratory variability, suggesting right atrial pressure of 3 mmHg.  IAS/Shunts: The interatrial septum was not well visualized.   LEFT VENTRICLE PLAX 2D LVIDd:         4.40 cm  Diastology LVIDs:         3.00 cm  LV e' lateral:   11.30 cm/s LV PW:         1.50 cm  LV E/e' lateral: 6.8 LV IVS:        1.40 cm  LV e' medial:    5.00 cm/s LVOT diam:     2.00 cm  LV E/e' medial:  15.3 LV SV:         66 LV SV Index:   37 LVOT Area:     3.14 cm   RIGHT VENTRICLE  IVC RV Basal diam:  3.00 cm     IVC diam: 1.40 cm RV S prime:     13.20 cm/s TAPSE (M-mode): 2.0 cm  LEFT ATRIUM             Index       RIGHT ATRIUM           Index LA diam:        3.30 cm 1.82 cm/m  RA Area:     13.70 cm LA Vol (A2C):   42.0 ml 23.13 ml/m RA Volume:   29.90 ml  16.47 ml/m LA Vol (A4C):   32.6 ml 17.95 ml/m LA Biplane Vol: 37.4 ml 20.60 ml/m AORTIC VALVE AV Area (Vmax):    2.01 cm AV Area (Vmean):   2.04 cm AV Area (VTI):     2.01 cm AV Vmax:           146.00 cm/s AV Vmean:          96.600 cm/s AV VTI:            0.329 m AV Peak Grad:      8.5 mmHg AV Mean Grad:      4.0 mmHg LVOT Vmax:         93.30 cm/s LVOT Vmean:        62.600 cm/s LVOT VTI:          0.211 m LVOT/AV VTI ratio: 0.64  AORTA Ao Root diam: 3.20 cm  MITRAL VALVE MV Area (PHT): 2.48 cm    SHUNTS MV Peak grad:  3.8 mmHg    Systemic VTI:  0.21 m MV Mean grad:  1.0 mmHg    Systemic Diam: 2.00 cm MV Vmax:       0.97 m/s MV Vmean:      52.1 cm/s MV Decel Time: 306 msec MV E velocity: 76.30 cm/s MV A velocity: 91.70 cm/s MV E/A ratio:  0.83  Epifanio Lesches MD Electronically signed by Epifanio Lesches MD Signature Date/Time: 02/22/2020/10:33:19 AM    Final             Risk  Assessment/Calculations:    CHA2DS2-VASc Score = 6   This indicates a 9.7% annual risk of stroke. The patient's score is based upon: CHF History: 0 HTN History: 1 Diabetes History: 0 Stroke History: 2 Vascular Disease History: 1 Age Score: 1 Gender Score: 1            Physical Exam:   VS:  BP (!) 80/60   Pulse (!) 58   Ht 4\' 11"  (1.499 m)   Wt 192 lb (87.1 kg)   BMI 38.78 kg/m    Wt Readings from Last 3 Encounters:  02/23/23 192 lb (87.1 kg)  12/22/22 191 lb 3.2 oz (86.7 kg)  11/24/22 194 lb 6.4 oz (88.2 kg)    GEN: Well nourished, overweight, well developed in no acute distress NECK: No JVD; No carotid bruits CARDIAC: RRR, no murmurs, rubs, gallops RESPIRATORY:  Clear to auscultation without rales, wheezing or rhonchi  ABDOMEN: Soft, non-tender, non-distended EXTREMITIES:  No edema; No deformity   ASSESSMENT AND PLAN: .    HTN-marked hypotension in clinic today.  Manual reading 80/60. Auto cuff 55/39. A&O x4. Symptomatic with lightheadedness, sensation of weakness.  Has not been monitoring BP routinely at home.  No recent medication changes.  Plan to proceed to ED for evaluation including BMP, CBC as concern dehydration, infection, or worsening anemia could be etiology of marked hypotension. Present regimen:  Continue Lisinopril 30mg  QD, Furosemide 40mg  PRN, Amlodipine 5mg  QHS, Metoprolol 25mg  BID. Consider reducing regimen pending ED evaluation.  10/27/2022 renal duplex with no stenosis.   PAF/hypercoagulable state- Denies palpitations. Continue Metoprolol tartrate 25mg  BID and Eliquis 5mg  BID. Does not meet dose reduction criteria. Denies melena, hematuria.  Concern for new anemia given marked hypotension.  Plan for ED eval.  Aortic atherosclerosis/HDL, LDL goal less than 70-05/2022 total cholesterol 137, triglycerides 97, HDL 58, LDL 60.  Continue atorvastatin 80 mg daily.  CKD -follows with Dr. Signe Colt with nephrology. Careful titration of diuretic and antihypertensive.      Bilateral carotid stenosis-s/p right CEA.  Carotid Doppler 03/2022 with right ICA 1 to 39% stenosis, left ICA 40-59% stenosis.  Repeat study in 1 year already ordered.  Continue optimal lipid and blood pressure control.   PAD-known carotid stenosis and prior femoral endarterectomy. Follows with Dr. Lenell Antu due for follow up 11/2023. Continue lipid control, as above.    OSA - Follows with Dr. Wynona Neat. CPAP compliance encouraged.        Dispo: follow up pending ED eval  Signed, Alver Sorrow, NP

## 2023-02-23 NOTE — Progress Notes (Signed)
Carelink Summary Report / Loop Recorder 

## 2023-02-23 NOTE — ED Notes (Signed)
Patient has home O2 at 2lpm for night time use. She had to placed on 2lpm [place] at this time due to a decrease in oxygen saturation of 88% RA.

## 2023-02-26 ENCOUNTER — Telehealth (HOSPITAL_BASED_OUTPATIENT_CLINIC_OR_DEPARTMENT_OTHER): Payer: Self-pay | Admitting: *Deleted

## 2023-02-26 NOTE — Telephone Encounter (Signed)
Post ED Visit - Positive Culture Follow-up  Culture report reviewed by antimicrobial stewardship pharmacist: [facility] [x]  Canon Amend,  [place].D. []  Celedonio Miyamoto, Pharm.D., BCPS AQ-ID []  Garvin Fila, Pharm.D., BCPS []  Georgina Pillion, Pharm.D., BCPS []  [place], [address].D., BCPS, AAHIVP []  Estella Husk, Pharm.D., BCPS, AAHIVP []  Lysle Pearl, PharmD, BCPS []  Phillips Climes, PharmD, BCPS []  Agapito Games, PharmD, BCPS []  Verlan Friends, PharmD []  Mervyn Gay, PharmD, BCPS []  Vinnie Level, PharmD  [facility] []  Len Childs, PharmD []  Greer Pickerel, PharmD []  Adalberto Cole, PharmD []  Perlie Gold, Rph []  Lonell Face) Jean Rosenthal, PharmD []  Earl Many, PharmD []  Junita Push, PharmD []  Dorna Leitz, PharmD []  Terrilee Files, PharmD []  Lynann Beaver, PharmD []  Keturah Barre, PharmD []  Loralee Pacas, PharmD []  Bernadene Person, PharmD   Positive urine culture Treated with Doxycycline, organism sensitive to the same and no further patient follow-up is required at this time.  Virl Axe [facility] 02/26/2023, 8:22 AM

## 2023-03-15 ENCOUNTER — Ambulatory Visit (INDEPENDENT_AMBULATORY_CARE_PROVIDER_SITE_OTHER): Payer: Medicare HMO

## 2023-03-15 DIAGNOSIS — I6340 Cerebral infarction due to embolism of unspecified cerebral artery: Secondary | ICD-10-CM

## 2023-03-16 LAB — CUP PACEART REMOTE DEVICE CHECK
Date Time Interrogation Session: 20240819000345
Implantable Pulse Generator Implant Date: 20210730

## 2023-03-22 ENCOUNTER — Ambulatory Visit: Payer: Medicare HMO

## 2023-03-26 NOTE — Progress Notes (Signed)
Carelink Summary Report / Loop Recorder 

## 2023-04-17 ENCOUNTER — Other Ambulatory Visit (HOSPITAL_BASED_OUTPATIENT_CLINIC_OR_DEPARTMENT_OTHER): Payer: Self-pay | Admitting: Family

## 2023-04-17 DIAGNOSIS — I10 Essential (primary) hypertension: Secondary | ICD-10-CM

## 2023-04-19 ENCOUNTER — Ambulatory Visit (INDEPENDENT_AMBULATORY_CARE_PROVIDER_SITE_OTHER): Payer: Medicare HMO

## 2023-04-19 DIAGNOSIS — I6340 Cerebral infarction due to embolism of unspecified cerebral artery: Secondary | ICD-10-CM

## 2023-04-20 ENCOUNTER — Ambulatory Visit: Payer: Medicare HMO | Admitting: Pulmonary Disease

## 2023-04-20 ENCOUNTER — Encounter: Payer: Self-pay | Admitting: Pulmonary Disease

## 2023-04-20 VITALS — BP 118/70 | HR 53 | Temp 97.9°F | Ht 59.0 in | Wt 193.2 lb

## 2023-04-20 DIAGNOSIS — J449 Chronic obstructive pulmonary disease, unspecified: Secondary | ICD-10-CM

## 2023-04-20 DIAGNOSIS — G4733 Obstructive sleep apnea (adult) (pediatric): Secondary | ICD-10-CM | POA: Diagnosis not present

## 2023-04-20 LAB — CUP PACEART REMOTE DEVICE CHECK
Date Time Interrogation Session: 20240919000234
Implantable Pulse Generator Implant Date: 20210730

## 2023-04-20 NOTE — Progress Notes (Signed)
Synopsis: Referred in March 2022 by Dr. Merri Brunette for COPD  Subjective:   PATIENT ID: Norma Scott GENDER: female DOB: [birth year], MRN: [id number]  HPI  Chief Complaint  Patient presents with   Follow-up    Breathing is overall doing well. She has had some wheezing and cough- relates to fall allergy season. Her cough is prod with clear sputum.    Norma Scott is a 72 year old woman, former smoker with history of coronary artery disease, GERD, stroke, obstructive sleep apnea and COPD who returns to pulmonary clinic for COPD follow up.   She continues on trelegy 1 puff daily. Has not required albuterol as needed. Feels increased sinus pressure/congestion recently with season changes. She stopped using her CPAP machine because it won't turn off - she had it looked at by DME company.   OV 01/15/22 She has done well since last year. She continues on trelegy 1 puff daily and is not needing albuterol inhaler much. She started using her CPAP with 2L of oxygen last year. Compliance report shows 74% usage days and  > 4 hours used 57%.   She has not completed PFTs.   OV 01/20/21 She reports she quit smoking on April 25 using nicotine patches and generic Chantix.  She had a sleep study done which showed severe obstructive sleep apnea and severe oxygen desaturations.  She was then set up for CPAP titration study and has been ordered a CPAP machine and supplies.  She has enrolled in our CT lung cancer screening program and has a repeat CT scan in 6 months.  OV 10/15/20 She is currently using trelegy ellipta 100 and has been on this medication for a couple years. She has noted benefit from its use. She is using albuterol as needed, mainly when she has exacerbations.   She reports having seasonal allergies that can be worse in the spring time. She does have sinus congestion and drainage.   She does have GERD but this is fairly well controlled on omeprazole.   She does have  daily cough with whitish sputum production.   She continues to smoke 7 cigarettes daily. She is wearing a 21mg  nicotine patch which has helped her cut back. She previously liked chantix as this helped her cut back to 3 cigarettes daily. She has been smoking since age 22 and has smoked up to 2 packs per day. She has over a 30 pack year smoking history.   She has had swelling of her lower extremities, right greater than left in which she had lower extremity US 01/2020 which was negative for DVT.   She has trouble being physically active due to her back pain.   She reports occasionally waking up short of breath. She has history of snoring.   Past Medical History:  Diagnosis Date   Allergy    allergic rhinitis   Arteriosclerotic coronary artery disease 05/21/2015   Asthma    CAD (coronary artery disease)    patient says no problems   dr Severiano Gilbert pcp   Cervical disc displacement    x2   COPD (chronic obstructive pulmonary disease) (HCC)    Dyspnea    Esophageal reflux    Essential hypertension 05/21/2015   Exertional dyspnea 03/01/2022   Family history of adverse reaction to anesthesia    mother hard to wake and affected b/p   Fibromyalgia    Gall stones    Generalized anxiety disorder    Headache  Hyperlipidemia    Lumbar disc herniation    L5-S1   Mini stroke    Peripheral vascular disease (HCC)    Sleep apnea    Stroke [facility])    July 2021   TIA (transient ischemic attack) 05/21/2015     Family History  Problem Relation Age of Onset   Stroke Mother    Hypertension Mother    Hyperlipidemia Mother    Seizures Mother        Grand mal seizures   Emphysema Mother    Diabetes Father    Heart disease Father        Heart Disease before age 5   Breast cancer Maternal Aunt    Heart disease Paternal Uncle    Heart attack Paternal Uncle    Stroke Maternal Grandmother    Heart Problems Maternal Grandmother    Cancer Maternal Grandfather        stomach cancer   Heart  attack Maternal Grandfather    Cancer Paternal Grandmother 73   Heart disease Paternal Grandfather    Heart attack Paternal Grandfather    Deep vein thrombosis Brother    Asthma Brother      Social History   Socioeconomic History   Marital status: Divorced    Spouse name: Not on file   Number of children: Not on file   Years of education: Not on file   Highest education level: Not on file  Occupational History   Not on file  Tobacco Use   Smoking status: Former    Current packs/day: 0.00    Average packs/day: 0.5 packs/day for 45.0 years (22.5 ttl pk-yrs)    Types: Cigarettes    Start date: 11/19/1975    Quit date: 11/18/2020    Years since quitting: 2.4   Smokeless tobacco: Never   Tobacco comments:    Former smoker 12/19/21  Vaping Use   Vaping status: Never Used  Substance and Sexual Activity   Alcohol use: Not Currently    Comment: 2 times a year   Drug use: No   Sexual activity: Not on file  Other Topics Concern   Not on file  Social History Narrative   Epworth Sleepiness Scale = 10 (as of 05/21/2015)   Social Determinants of Health   Financial Resource Strain: Not on file  Food Insecurity: Not on file  Transportation Needs: Not on file  Physical Activity: Not on file  Stress: Not on file  Social Connections: Not on file  Intimate Partner Violence: Not on file     Allergies  Allergen Reactions   Bupropion Other (See Comments)    Hallucinations    Septra [Sulfamethoxazole-Trimethoprim] Nausea And Vomiting   Sulfa Antibiotics Nausea And Vomiting   Zithromax [Azithromycin] Hives   Crestor [Rosuvastatin]     Other reaction(s): elevated liver functions   Cymbalta [Duloxetine Hcl] Other (See Comments)    fatigue   Zoloft [Sertraline]     Other reaction(s): fatigue     Outpatient Medications Prior to Visit  Medication Sig Dispense Refill   albuterol (PROVENTIL HFA) 108 (90 Base) MCG/ACT inhaler Inhale 1 puff into the lungs every 6 (six) hours as  needed for wheezing or shortness of breath. 1 Inhaler 0   amLODipine (NORVASC) 5 MG tablet TAKE 1 TABLET EVERY DAY 90 tablet 3   apixaban (ELIQUIS) 5 MG TABS tablet Take 1 tablet by mouth twice daily 60 tablet 6   aspirin EC 81 MG EC tablet Take 1 tablet (81 mg  total) by mouth daily. Swallow whole. 30 tablet 11   atorvastatin (LIPITOR) 80 MG tablet TAKE 1 TABLET AT BEDTIME (DISCONTINUE ATORVASTATIN 40MG , NEW DOSE) 90 tablet 3   calcium carbonate (OSCAL) 1500 (600 Ca) MG TABS tablet Take 1,500 mg by mouth daily.     clonazePAM (KLONOPIN) 0.5 MG tablet Take 0.5 mg by mouth 3 (three) times daily as needed for anxiety.     co-enzyme Q-10 30 MG capsule Take 30 mg by mouth daily.     famotidine (PEPCID) 20 MG tablet Take 20 mg by mouth every other day. Alternate with prilosec every other day     FLUoxetine (PROZAC) 20 MG capsule Take 60 mg by mouth daily.      fluticasone (FLONASE) 50 MCG/ACT nasal spray Use 2 spray(s) in each nostril once daily 16 g 0   furosemide (LASIX) 20 MG tablet Take 40 mg by mouth daily.     gabapentin (NEURONTIN) 300 MG capsule Take 300-600 mg by mouth See admin instructions. Take 300 mg in the morning, and 600 mg at bedtime     ipratropium (ATROVENT) 0.03 % nasal spray USE 2 SPRAY(S) IN EACH NOSTRIL EVERY 12 HOURS 30 mL 0   lisinopril (ZESTRIL) 30 MG tablet Take 30 mg by mouth daily.      loratadine (CLARITIN) 10 MG tablet Take 10 mg by mouth daily.     metoprolol tartrate (LOPRESSOR) 25 MG tablet Take 25 mg by mouth 2 (two) times daily.     omeprazole (PRILOSEC) 20 MG capsule Take 20 mg by mouth every other day. Alternate with famotidine every other day     tiZANidine (ZANAFLEX) 4 MG tablet Take 4 mg by mouth every evening.     TRELEGY ELLIPTA 100-62.5-25 MCG/INH AEPB Inhale 1 puff into the lungs daily.     No facility-administered medications prior to visit.   Review of Systems  Constitutional:  Negative for chills, fever, malaise/fatigue and weight loss.  HENT:   Negative for congestion, sinus pain and sore throat.   Eyes: Negative.   Respiratory:  Positive for shortness of breath. Negative for hemoptysis, sputum production and wheezing.   Cardiovascular:  Negative for chest pain, palpitations, orthopnea, claudication and leg swelling.  Gastrointestinal:  Negative for abdominal pain, heartburn, nausea and vomiting.  Genitourinary: Negative.   Musculoskeletal:  Negative for joint pain and myalgias.  Skin:  Negative for rash.  Neurological:  Negative for weakness.  Endo/Heme/Allergies: Negative.   Psychiatric/Behavioral: Negative.     Objective:   Vitals:   04/20/23 1009  BP: 118/70  Pulse: (!) 53  Temp: 97.9 F (36.6 C)  TempSrc: Oral  SpO2: 99%  Weight: 193 lb 3.2 oz (87.6 kg)  Height: 4\' 11"  (1.499 m)   Physical Exam Constitutional:      General: She is not in acute distress.    Appearance: She is not ill-appearing.  HENT:     Head: Normocephalic and atraumatic.  Eyes:     Conjunctiva/sclera: Conjunctivae normal.  Cardiovascular:     Rate and Rhythm: Normal rate and regular rhythm.     Pulses: Normal pulses.     Heart sounds: Normal heart sounds. No murmur heard. Pulmonary:     Effort: Pulmonary effort is normal.     Breath sounds: No wheezing, rhonchi or rales.  Musculoskeletal:     Right lower leg: No edema.     Left lower leg: No edema.  Skin:    General: Skin is warm and dry.  Neurological:  General: No focal deficit present.     Mental Status: She is alert.    CBC    Component Value Date/Time   WBC 8.6 02/23/2023 1245   RBC 4.75 02/23/2023 1245   HGB 13.1 02/23/2023 1245   HCT 40.5 02/23/2023 1245   PLT 208 02/23/2023 1245   MCV 85.3 02/23/2023 1245   MCH 27.6 02/23/2023 1245   MCHC 32.3 02/23/2023 1245   RDW 14.8 02/23/2023 1245   LYMPHSABS 3.0 02/21/2020 2018   MONOABS 0.7 02/21/2020 2018   EOSABS 0.2 02/21/2020 2018   BASOSABS 0.1 02/21/2020 2018      Latest Ref Rng & Units 02/23/2023   12:45 PM  02/15/2021    4:46 AM 02/12/2021    9:50 AM  BMP  Glucose 70 - 99 mg/dL 97  409  811   BUN 8 - 23 mg/dL 15  15  17    Creatinine 0.44 - 1.00 mg/dL 9.14  7.82  9.56   Sodium 135 - 145 mmol/L 136  132  137   Potassium 3.5 - 5.1 mmol/L 3.9  5.8  4.2   Chloride 98 - 111 mmol/L 103  101  102   CO2 22 - 32 mmol/L 24  24  23    Calcium 8.9 - 10.3 mg/dL 9.2  8.2  9.0    Chest imaging: CT Chest Lung Cancer Screening 12/04/20 Mediastinum/Nodes: No mediastinal lymphadenopathy.   Lungs/Pleura: Centrilobular and paraseptal emphysema. Biapical pleuroparenchymal scarring noted, right greater than left. Bilateral pulmonary nodules identified measuring up to 6.3 mm in the paraspinal left lower lobe (image 115). No overtly suspicious nodule or mass. No focal airspace consolidation. No pleural effusion.  CXR 02/21/20 Borderline cardiomegaly. No consolidation or effusion. Aortic atherosclerosis. No pneumothorax.  PFT:     No data to display          Labs:  Path:  Echo 02/22/20:  1. Left ventricular ejection fraction, by estimation, is 60 to 65%. The  left ventricle has normal function. The left ventricle has no regional  wall motion abnormalities. There is moderate left ventricular hypertrophy.  Left ventricular diastolic  parameters are indeterminate.   2. Right ventricular systolic function is normal. The right ventricular  size is normal. Tricuspid regurgitation signal is inadequate for assessing  PA pressure.   3. The mitral valve is grossly normal. No evidence of mitral valve  regurgitation.   4. The aortic valve was not well visualized. Aortic valve regurgitation  is not visualized. Mild to moderate aortic valve sclerosis/calcification  is present, without any evidence of aortic stenosis.   5. The inferior vena cava is normal in size with greater than 50%  respiratory variability, suggesting right atrial pressure of 3 mmHg.   Assessment & Plan:   Chronic obstructive pulmonary  disease, unspecified COPD type (HCC)  OSA on CPAP - Plan: Ambulatory Referral for DME  Discussion: Norma Scott is a 72 year old woman, former smoker with history of coronary artery disease, GERD, stroke and COPD who returns to pulmonary clinic for COPD follow up.   Her COPD appears to be under good control at this time on trelegy ellipta daily and as needed albuterol.    She is to continue CPAP with O2. We will place orders to her DME so her machine has accurate settings as it seems she has tried to adjust it recently with concern it isn't functioning properly. She is to follow up with Dr. Wynona Neat for further monitoring.   Follow up in  1 year.  Melody Comas, MD  Pulmonary & Critical Care Office: [phone]   Current Outpatient Medications:    albuterol (PROVENTIL HFA) 108 (90 Base) MCG/ACT inhaler, Inhale 1 puff into the lungs every 6 (six) hours as needed for wheezing or shortness of breath., Disp: 1 Inhaler, Rfl: 0   amLODipine (NORVASC) 5 MG tablet, TAKE 1 TABLET EVERY DAY, Disp: 90 tablet, Rfl: 3   apixaban (ELIQUIS) 5 MG TABS tablet, Take 1 tablet by mouth twice daily, Disp: 60 tablet, Rfl: 6   aspirin EC 81 MG EC tablet, Take 1 tablet (81 mg total) by mouth daily. Swallow whole., Disp: 30 tablet, Rfl: 11   atorvastatin (LIPITOR) 80 MG tablet, TAKE 1 TABLET AT BEDTIME (DISCONTINUE ATORVASTATIN 40MG , NEW DOSE), Disp: 90 tablet, Rfl: 3   calcium carbonate (OSCAL) 1500 (600 Ca) MG TABS tablet, Take 1,500 mg by mouth daily., Disp: , Rfl:    clonazePAM (KLONOPIN) 0.5 MG tablet, Take 0.5 mg by mouth 3 (three) times daily as needed for anxiety., Disp: , Rfl:    co-enzyme Q-10 30 MG capsule, Take 30 mg by mouth daily., Disp: , Rfl:    famotidine (PEPCID) 20 MG tablet, Take 20 mg by mouth every other day. Alternate with prilosec every other day, Disp: , Rfl:    FLUoxetine (PROZAC) 20 MG capsule, Take 60 mg by mouth daily. , Disp: , Rfl:    fluticasone (FLONASE) 50 MCG/ACT  nasal spray, Use 2 spray(s) in each nostril once daily, Disp: 16 g, Rfl: 0   furosemide (LASIX) 20 MG tablet, Take 40 mg by mouth daily., Disp: , Rfl:    gabapentin (NEURONTIN) 300 MG capsule, Take 300-600 mg by mouth See admin instructions. Take 300 mg in the morning, and 600 mg at bedtime, Disp: , Rfl:    ipratropium (ATROVENT) 0.03 % nasal spray, USE 2 SPRAY(S) IN EACH NOSTRIL EVERY 12 HOURS, Disp: 30 mL, Rfl: 0   lisinopril (ZESTRIL) 30 MG tablet, Take 30 mg by mouth daily. , Disp: , Rfl:    loratadine (CLARITIN) 10 MG tablet, Take 10 mg by mouth daily., Disp: , Rfl:    metoprolol tartrate (LOPRESSOR) 25 MG tablet, Take 25 mg by mouth 2 (two) times daily., Disp: , Rfl:    omeprazole (PRILOSEC) 20 MG capsule, Take 20 mg by mouth every other day. Alternate with famotidine every other day, Disp: , Rfl:    tiZANidine (ZANAFLEX) 4 MG tablet, Take 4 mg by mouth every evening., Disp: , Rfl:    TRELEGY ELLIPTA 100-62.5-25 MCG/INH AEPB, Inhale 1 puff into the lungs daily., Disp: , Rfl:

## 2023-04-20 NOTE — Patient Instructions (Addendum)
We will send order to DME company to have your settings adjusted back to the previous order and make sure you can get new mask and supplies  CPAP at 13cmH2O with humidification with full face mask  Continue trelegy ellipta 1 puff daily - rinse mouth out after each use  Use albuterol inhaler as needed 1-2 puffs every 4-6 hours as needed for wheezing  Check out CPAP.com or search for CPAP supplies online and you may be able to find your mask or other supplies cheaper.  Follow up in 1 year, call sooner if needed

## 2023-04-21 ENCOUNTER — Encounter (HOSPITAL_BASED_OUTPATIENT_CLINIC_OR_DEPARTMENT_OTHER): Payer: Medicare HMO

## 2023-04-21 ENCOUNTER — Ambulatory Visit (HOSPITAL_BASED_OUTPATIENT_CLINIC_OR_DEPARTMENT_OTHER): Payer: Medicare HMO

## 2023-04-21 DIAGNOSIS — Z8673 Personal history of transient ischemic attack (TIA), and cerebral infarction without residual deficits: Secondary | ICD-10-CM

## 2023-04-21 DIAGNOSIS — I779 Disorder of arteries and arterioles, unspecified: Secondary | ICD-10-CM

## 2023-04-30 ENCOUNTER — Other Ambulatory Visit (HOSPITAL_BASED_OUTPATIENT_CLINIC_OR_DEPARTMENT_OTHER): Payer: Self-pay

## 2023-04-30 DIAGNOSIS — I6523 Occlusion and stenosis of bilateral carotid arteries: Secondary | ICD-10-CM

## 2023-05-04 NOTE — Progress Notes (Signed)
Carelink Summary Report / Loop Recorder 

## 2023-05-16 ENCOUNTER — Other Ambulatory Visit (HOSPITAL_COMMUNITY): Payer: Self-pay | Admitting: Physician Assistant

## 2023-05-24 ENCOUNTER — Ambulatory Visit (INDEPENDENT_AMBULATORY_CARE_PROVIDER_SITE_OTHER): Payer: Medicare HMO

## 2023-05-24 DIAGNOSIS — I6340 Cerebral infarction due to embolism of unspecified cerebral artery: Secondary | ICD-10-CM

## 2023-05-24 LAB — CUP PACEART REMOTE DEVICE CHECK
Date Time Interrogation Session: 20241028000018
Implantable Pulse Generator Implant Date: 20210730

## 2023-06-14 NOTE — Progress Notes (Signed)
Carelink Summary Report / Loop Recorder 

## 2023-06-27 LAB — CUP PACEART REMOTE DEVICE CHECK: Date Time Interrogation Session: 20241201000213

## 2023-06-28 ENCOUNTER — Ambulatory Visit (INDEPENDENT_AMBULATORY_CARE_PROVIDER_SITE_OTHER): Payer: Medicare HMO

## 2023-06-28 DIAGNOSIS — I6340 Cerebral infarction due to embolism of unspecified cerebral artery: Secondary | ICD-10-CM

## 2023-06-30 DIAGNOSIS — I700 Atherosclerosis of aorta: Secondary | ICD-10-CM | POA: Diagnosis not present

## 2023-06-30 DIAGNOSIS — Z23 Encounter for immunization: Secondary | ICD-10-CM | POA: Diagnosis not present

## 2023-06-30 DIAGNOSIS — Z1331 Encounter for screening for depression: Secondary | ICD-10-CM | POA: Diagnosis not present

## 2023-06-30 DIAGNOSIS — Z0000 Encounter for general adult medical examination without abnormal findings: Secondary | ICD-10-CM | POA: Diagnosis not present

## 2023-06-30 DIAGNOSIS — F411 Generalized anxiety disorder: Secondary | ICD-10-CM | POA: Diagnosis not present

## 2023-06-30 DIAGNOSIS — E7800 Pure hypercholesterolemia, unspecified: Secondary | ICD-10-CM | POA: Diagnosis not present

## 2023-06-30 DIAGNOSIS — I4891 Unspecified atrial fibrillation: Secondary | ICD-10-CM | POA: Diagnosis not present

## 2023-06-30 DIAGNOSIS — I679 Cerebrovascular disease, unspecified: Secondary | ICD-10-CM | POA: Diagnosis not present

## 2023-06-30 DIAGNOSIS — I10 Essential (primary) hypertension: Secondary | ICD-10-CM | POA: Diagnosis not present

## 2023-06-30 DIAGNOSIS — N1832 Chronic kidney disease, stage 3b: Secondary | ICD-10-CM | POA: Diagnosis not present

## 2023-07-07 DIAGNOSIS — N2581 Secondary hyperparathyroidism of renal origin: Secondary | ICD-10-CM | POA: Diagnosis not present

## 2023-07-07 DIAGNOSIS — I129 Hypertensive chronic kidney disease with stage 1 through stage 4 chronic kidney disease, or unspecified chronic kidney disease: Secondary | ICD-10-CM | POA: Diagnosis not present

## 2023-07-07 DIAGNOSIS — D631 Anemia in chronic kidney disease: Secondary | ICD-10-CM | POA: Diagnosis not present

## 2023-07-07 DIAGNOSIS — N1832 Chronic kidney disease, stage 3b: Secondary | ICD-10-CM | POA: Diagnosis not present

## 2023-07-07 DIAGNOSIS — I639 Cerebral infarction, unspecified: Secondary | ICD-10-CM | POA: Diagnosis not present

## 2023-07-07 DIAGNOSIS — I480 Paroxysmal atrial fibrillation: Secondary | ICD-10-CM | POA: Diagnosis not present

## 2023-07-07 DIAGNOSIS — N189 Chronic kidney disease, unspecified: Secondary | ICD-10-CM | POA: Diagnosis not present

## 2023-07-23 DIAGNOSIS — E215 Disorder of parathyroid gland, unspecified: Secondary | ICD-10-CM | POA: Diagnosis not present

## 2023-07-23 DIAGNOSIS — R944 Abnormal results of kidney function studies: Secondary | ICD-10-CM | POA: Diagnosis not present

## 2023-08-02 ENCOUNTER — Ambulatory Visit (INDEPENDENT_AMBULATORY_CARE_PROVIDER_SITE_OTHER): Payer: Medicare HMO

## 2023-08-02 DIAGNOSIS — I6340 Cerebral infarction due to embolism of unspecified cerebral artery: Secondary | ICD-10-CM | POA: Diagnosis not present

## 2023-08-03 LAB — CUP PACEART REMOTE DEVICE CHECK: Date Time Interrogation Session: 20250106000321

## 2023-09-06 ENCOUNTER — Ambulatory Visit: Payer: Medicare HMO

## 2023-09-06 DIAGNOSIS — I6340 Cerebral infarction due to embolism of unspecified cerebral artery: Secondary | ICD-10-CM

## 2023-09-12 ENCOUNTER — Other Ambulatory Visit (HOSPITAL_COMMUNITY): Payer: Self-pay | Admitting: Physician Assistant

## 2023-09-13 NOTE — Addendum Note (Signed)
 Addended by: Geralyn Flash D on: 09/13/2023 12:48 PM   Modules accepted: Orders

## 2023-09-13 NOTE — Progress Notes (Signed)
 [facility] Summary Report / Loop Recorder

## 2023-09-15 LAB — CUP PACEART REMOTE DEVICE CHECK: Date Time Interrogation Session: 20250206000226

## 2023-10-10 ENCOUNTER — Other Ambulatory Visit: Payer: Self-pay | Admitting: Cardiovascular Disease

## 2023-10-11 ENCOUNTER — Ambulatory Visit (INDEPENDENT_AMBULATORY_CARE_PROVIDER_SITE_OTHER): Payer: Medicare HMO

## 2023-10-11 DIAGNOSIS — I6340 Cerebral infarction due to embolism of unspecified cerebral artery: Secondary | ICD-10-CM

## 2023-10-12 LAB — CUP PACEART REMOTE DEVICE CHECK: Date Time Interrogation Session: 20250317000110

## 2023-10-15 NOTE — Addendum Note (Signed)
 Addended by: Geralyn Flash D on: 10/15/2023 10:45 AM   Modules accepted: Orders

## 2023-10-15 NOTE — Progress Notes (Signed)
 [facility] Summary Report / Loop Recorder

## 2023-11-15 ENCOUNTER — Ambulatory Visit (INDEPENDENT_AMBULATORY_CARE_PROVIDER_SITE_OTHER): Payer: Medicare HMO

## 2023-11-15 DIAGNOSIS — I6340 Cerebral infarction due to embolism of unspecified cerebral artery: Secondary | ICD-10-CM

## 2023-11-15 LAB — CUP PACEART REMOTE DEVICE CHECK: Date Time Interrogation Session: 20250421000326

## 2023-11-25 DIAGNOSIS — R69 Illness, unspecified: Secondary | ICD-10-CM | POA: Diagnosis not present

## 2023-11-30 NOTE — Addendum Note (Signed)
 Addended by: Edra Govern D on: 11/30/2023 12:42 PM   Modules accepted: Orders

## 2023-11-30 NOTE — Progress Notes (Signed)
 [facility] Summary Report / Loop Recorder

## 2023-12-04 ENCOUNTER — Other Ambulatory Visit (HOSPITAL_COMMUNITY): Payer: Self-pay | Admitting: Physician Assistant

## 2023-12-06 NOTE — Telephone Encounter (Signed)
 Prescription refill request for Eliquis  received. Indication:cva Last office visit:7/24 Scr:1.32  7/24 Age: 72 Weight:87.6  kg  Prescription refilled

## 2023-12-21 ENCOUNTER — Ambulatory Visit: Payer: Medicare HMO

## 2023-12-21 DIAGNOSIS — I6340 Cerebral infarction due to embolism of unspecified cerebral artery: Secondary | ICD-10-CM | POA: Diagnosis not present

## 2023-12-21 LAB — CUP PACEART REMOTE DEVICE CHECK: Date Time Interrogation Session: 20250527000301

## 2023-12-22 ENCOUNTER — Ambulatory Visit: Payer: Self-pay | Admitting: Cardiology

## 2023-12-23 DIAGNOSIS — N1832 Chronic kidney disease, stage 3b: Secondary | ICD-10-CM | POA: Diagnosis not present

## 2023-12-23 DIAGNOSIS — I4891 Unspecified atrial fibrillation: Secondary | ICD-10-CM | POA: Diagnosis not present

## 2023-12-23 DIAGNOSIS — I10 Essential (primary) hypertension: Secondary | ICD-10-CM | POA: Diagnosis not present

## 2023-12-23 DIAGNOSIS — I739 Peripheral vascular disease, unspecified: Secondary | ICD-10-CM | POA: Diagnosis not present

## 2023-12-24 ENCOUNTER — Other Ambulatory Visit: Payer: Self-pay | Admitting: Cardiovascular Disease

## 2023-12-30 DIAGNOSIS — F411 Generalized anxiety disorder: Secondary | ICD-10-CM | POA: Diagnosis not present

## 2023-12-30 DIAGNOSIS — I679 Cerebrovascular disease, unspecified: Secondary | ICD-10-CM | POA: Diagnosis not present

## 2023-12-30 DIAGNOSIS — E7800 Pure hypercholesterolemia, unspecified: Secondary | ICD-10-CM | POA: Diagnosis not present

## 2023-12-30 DIAGNOSIS — I10 Essential (primary) hypertension: Secondary | ICD-10-CM | POA: Diagnosis not present

## 2023-12-30 DIAGNOSIS — I700 Atherosclerosis of aorta: Secondary | ICD-10-CM | POA: Diagnosis not present

## 2023-12-30 DIAGNOSIS — K219 Gastro-esophageal reflux disease without esophagitis: Secondary | ICD-10-CM | POA: Diagnosis not present

## 2023-12-30 DIAGNOSIS — I739 Peripheral vascular disease, unspecified: Secondary | ICD-10-CM | POA: Diagnosis not present

## 2023-12-30 DIAGNOSIS — I4891 Unspecified atrial fibrillation: Secondary | ICD-10-CM | POA: Diagnosis not present

## 2023-12-30 DIAGNOSIS — J449 Chronic obstructive pulmonary disease, unspecified: Secondary | ICD-10-CM | POA: Diagnosis not present

## 2024-01-05 NOTE — Progress Notes (Signed)
 [facility] Summary Report / Loop Recorder

## 2024-01-20 ENCOUNTER — Ambulatory Visit

## 2024-01-20 DIAGNOSIS — I6340 Cerebral infarction due to embolism of unspecified cerebral artery: Secondary | ICD-10-CM

## 2024-01-20 LAB — CUP PACEART REMOTE DEVICE CHECK: Date Time Interrogation Session: 20250626000054

## 2024-01-21 DIAGNOSIS — I4891 Unspecified atrial fibrillation: Secondary | ICD-10-CM | POA: Diagnosis not present

## 2024-01-21 DIAGNOSIS — N1832 Chronic kidney disease, stage 3b: Secondary | ICD-10-CM | POA: Diagnosis not present

## 2024-01-21 DIAGNOSIS — I739 Peripheral vascular disease, unspecified: Secondary | ICD-10-CM | POA: Diagnosis not present

## 2024-01-21 DIAGNOSIS — I10 Essential (primary) hypertension: Secondary | ICD-10-CM | POA: Diagnosis not present

## 2024-01-23 ENCOUNTER — Ambulatory Visit: Payer: Self-pay | Admitting: Cardiology

## 2024-01-24 DIAGNOSIS — I10 Essential (primary) hypertension: Secondary | ICD-10-CM | POA: Diagnosis not present

## 2024-01-24 DIAGNOSIS — J449 Chronic obstructive pulmonary disease, unspecified: Secondary | ICD-10-CM | POA: Diagnosis not present

## 2024-01-24 DIAGNOSIS — N1832 Chronic kidney disease, stage 3b: Secondary | ICD-10-CM | POA: Diagnosis not present

## 2024-01-24 DIAGNOSIS — I4891 Unspecified atrial fibrillation: Secondary | ICD-10-CM | POA: Diagnosis not present

## 2024-01-24 DIAGNOSIS — I739 Peripheral vascular disease, unspecified: Secondary | ICD-10-CM | POA: Diagnosis not present

## 2024-02-03 DIAGNOSIS — N1832 Chronic kidney disease, stage 3b: Secondary | ICD-10-CM | POA: Diagnosis not present

## 2024-02-03 DIAGNOSIS — I739 Peripheral vascular disease, unspecified: Secondary | ICD-10-CM | POA: Diagnosis not present

## 2024-02-03 DIAGNOSIS — J449 Chronic obstructive pulmonary disease, unspecified: Secondary | ICD-10-CM | POA: Diagnosis not present

## 2024-02-03 DIAGNOSIS — N2581 Secondary hyperparathyroidism of renal origin: Secondary | ICD-10-CM | POA: Diagnosis not present

## 2024-02-03 DIAGNOSIS — I480 Paroxysmal atrial fibrillation: Secondary | ICD-10-CM | POA: Diagnosis not present

## 2024-02-03 DIAGNOSIS — D631 Anemia in chronic kidney disease: Secondary | ICD-10-CM | POA: Diagnosis not present

## 2024-02-03 DIAGNOSIS — I129 Hypertensive chronic kidney disease with stage 1 through stage 4 chronic kidney disease, or unspecified chronic kidney disease: Secondary | ICD-10-CM | POA: Diagnosis not present

## 2024-02-03 DIAGNOSIS — I639 Cerebral infarction, unspecified: Secondary | ICD-10-CM | POA: Diagnosis not present

## 2024-02-03 DIAGNOSIS — N189 Chronic kidney disease, unspecified: Secondary | ICD-10-CM | POA: Diagnosis not present

## 2024-02-10 ENCOUNTER — Other Ambulatory Visit: Payer: Self-pay | Admitting: Acute Care

## 2024-02-10 DIAGNOSIS — Z122 Encounter for screening for malignant neoplasm of respiratory organs: Secondary | ICD-10-CM

## 2024-02-10 DIAGNOSIS — Z87891 Personal history of nicotine dependence: Secondary | ICD-10-CM

## 2024-02-11 NOTE — Progress Notes (Signed)
 [facility] Summary Report / Loop Recorder

## 2024-02-14 ENCOUNTER — Encounter: Payer: Self-pay | Admitting: Acute Care

## 2024-02-20 DIAGNOSIS — I4891 Unspecified atrial fibrillation: Secondary | ICD-10-CM | POA: Diagnosis not present

## 2024-02-20 DIAGNOSIS — I739 Peripheral vascular disease, unspecified: Secondary | ICD-10-CM | POA: Diagnosis not present

## 2024-02-20 DIAGNOSIS — I10 Essential (primary) hypertension: Secondary | ICD-10-CM | POA: Diagnosis not present

## 2024-02-20 DIAGNOSIS — N1832 Chronic kidney disease, stage 3b: Secondary | ICD-10-CM | POA: Diagnosis not present

## 2024-02-21 ENCOUNTER — Ambulatory Visit (INDEPENDENT_AMBULATORY_CARE_PROVIDER_SITE_OTHER)

## 2024-02-21 DIAGNOSIS — I6340 Cerebral infarction due to embolism of unspecified cerebral artery: Secondary | ICD-10-CM

## 2024-02-21 LAB — CUP PACEART REMOTE DEVICE CHECK: Date Time Interrogation Session: 20250728000318

## 2024-02-22 ENCOUNTER — Ambulatory Visit: Payer: Self-pay | Admitting: Cardiology

## 2024-02-23 ENCOUNTER — Inpatient Hospital Stay
Admission: RE | Admit: 2024-02-23 | Discharge: 2024-02-23 | Disposition: A | Discharge status: Home / Self Care | Source: Ambulatory Visit | Attending: Family Medicine | Admitting: Family Medicine

## 2024-02-23 DIAGNOSIS — I700 Atherosclerosis of aorta: Secondary | ICD-10-CM | POA: Diagnosis not present

## 2024-02-23 DIAGNOSIS — Z87891 Personal history of nicotine dependence: Secondary | ICD-10-CM

## 2024-02-23 DIAGNOSIS — Z122 Encounter for screening for malignant neoplasm of respiratory organs: Secondary | ICD-10-CM | POA: Diagnosis not present

## 2024-02-23 DIAGNOSIS — J439 Emphysema, unspecified: Secondary | ICD-10-CM | POA: Diagnosis not present

## 2024-02-24 DIAGNOSIS — I10 Essential (primary) hypertension: Secondary | ICD-10-CM | POA: Diagnosis not present

## 2024-02-24 DIAGNOSIS — I4891 Unspecified atrial fibrillation: Secondary | ICD-10-CM | POA: Diagnosis not present

## 2024-02-24 DIAGNOSIS — I739 Peripheral vascular disease, unspecified: Secondary | ICD-10-CM | POA: Diagnosis not present

## 2024-02-24 DIAGNOSIS — N1832 Chronic kidney disease, stage 3b: Secondary | ICD-10-CM | POA: Diagnosis not present

## 2024-02-24 DIAGNOSIS — J449 Chronic obstructive pulmonary disease, unspecified: Secondary | ICD-10-CM | POA: Diagnosis not present

## 2024-03-06 ENCOUNTER — Telehealth: Payer: Self-pay | Admitting: Acute Care

## 2024-03-06 ENCOUNTER — Telehealth: Payer: Self-pay

## 2024-03-06 DIAGNOSIS — R911 Solitary pulmonary nodule: Secondary | ICD-10-CM

## 2024-03-06 NOTE — Telephone Encounter (Signed)
 I have called the patient with the results of her low dose Ct chest. She has had a productive cough with sometimes green, and sometimes clear secretions . She has had about 10 pounds of weight loss over 6 months. Radiology questioned if some of the changes may be related to infection/ inflammation. I have asked her to call her PCP to see if she will treat her for an URI. Then we will do a scan 1 month after treatment. I have given Norma Scott the screening office number to call and let us  know when she was able to start treatment , so we know when to scheduled the scan. I have faxed the scan results to the PCP and updated her on the plan. Thanks so much

## 2024-03-06 NOTE — Telephone Encounter (Signed)
 Call Report from Tiffany  IMPRESSION: 1. Developing part solid density associated with an area of parenchymal scarring within the right lower lobe with a total size of 23.4 mm in mean diameter with a solid component measuring 15.1 mm in mean diameter. Lung-RADS 4B, suspicious. Chest CT with or without contrast, PET/CT and/or tissue sampling depending on the probability of malignancy and comorbidities. PET/CT may be used when there is a = 8 mm (= 268 mm) solid component. For new large nodules that develop on an annual repeat screening CT, a 1 month LDCT may be recommended to address potentially infectious or inflammatory conditions 2. Extensive multi-vessel coronary artery calcification. 3. Mild emphysema. 4. Stable marked extrahepatic biliary ductal dilation, incompletely evaluated but likely reflecting post cholecystectomy change.   Aortic Atherosclerosis (ICD10-I70.0) and Emphysema (ICD10-J43.9).

## 2024-03-07 DIAGNOSIS — R911 Solitary pulmonary nodule: Secondary | ICD-10-CM | POA: Diagnosis not present

## 2024-03-07 DIAGNOSIS — J988 Other specified respiratory disorders: Secondary | ICD-10-CM | POA: Diagnosis not present

## 2024-03-07 NOTE — Telephone Encounter (Signed)
 Reminder set to outreach patient in one month if we have not heard form her.

## 2024-03-07 NOTE — Telephone Encounter (Signed)
 See provider note from 03/06/2024

## 2024-03-09 ENCOUNTER — Other Ambulatory Visit: Payer: Self-pay | Admitting: Cardiovascular Disease

## 2024-03-14 NOTE — Telephone Encounter (Signed)
 Patient called in. Spoke to pt. She states she started taking Augmentin (500/125) on or just after 8/13. Patient states the bottle states 1 tab every 12 hours but states she has really be taking it only about once a day. We spent several minutes discussing the importance of taking antibiotics as directed to be therapeutic. Patient verbalized understanding.  I advised pt to put an alarm on her phone to alert her when her doses are due, pt confirmed she would do this. Patient states she has 13 tablets left which would be 6 days left (if she takes her PM tablet). Pt is scheduled to complete her medication on 8/25. Patient has been scheduled for LDCT nodule follow up CT on 04/24/2024 at 9:00 at GI.

## 2024-03-21 DIAGNOSIS — I739 Peripheral vascular disease, unspecified: Secondary | ICD-10-CM | POA: Diagnosis not present

## 2024-03-21 DIAGNOSIS — N1832 Chronic kidney disease, stage 3b: Secondary | ICD-10-CM | POA: Diagnosis not present

## 2024-03-21 DIAGNOSIS — I10 Essential (primary) hypertension: Secondary | ICD-10-CM | POA: Diagnosis not present

## 2024-03-21 DIAGNOSIS — I4891 Unspecified atrial fibrillation: Secondary | ICD-10-CM | POA: Diagnosis not present

## 2024-03-23 ENCOUNTER — Ambulatory Visit

## 2024-03-23 DIAGNOSIS — I6340 Cerebral infarction due to embolism of unspecified cerebral artery: Secondary | ICD-10-CM

## 2024-03-23 LAB — CUP PACEART REMOTE DEVICE CHECK: Date Time Interrogation Session: 20250828000017

## 2024-03-26 DIAGNOSIS — J449 Chronic obstructive pulmonary disease, unspecified: Secondary | ICD-10-CM | POA: Diagnosis not present

## 2024-03-26 DIAGNOSIS — I739 Peripheral vascular disease, unspecified: Secondary | ICD-10-CM | POA: Diagnosis not present

## 2024-03-26 DIAGNOSIS — I4891 Unspecified atrial fibrillation: Secondary | ICD-10-CM | POA: Diagnosis not present

## 2024-03-26 DIAGNOSIS — N1832 Chronic kidney disease, stage 3b: Secondary | ICD-10-CM | POA: Diagnosis not present

## 2024-03-26 DIAGNOSIS — I10 Essential (primary) hypertension: Secondary | ICD-10-CM | POA: Diagnosis not present

## 2024-03-31 ENCOUNTER — Ambulatory Visit: Payer: Self-pay | Admitting: Cardiology

## 2024-04-06 NOTE — Progress Notes (Signed)
 Remote Loop Recorder Transmission

## 2024-04-07 NOTE — Progress Notes (Signed)
 Remote Loop Recorder Transmission

## 2024-04-20 DIAGNOSIS — I10 Essential (primary) hypertension: Secondary | ICD-10-CM | POA: Diagnosis not present

## 2024-04-20 DIAGNOSIS — I4891 Unspecified atrial fibrillation: Secondary | ICD-10-CM | POA: Diagnosis not present

## 2024-04-20 DIAGNOSIS — I739 Peripheral vascular disease, unspecified: Secondary | ICD-10-CM | POA: Diagnosis not present

## 2024-04-20 DIAGNOSIS — N1832 Chronic kidney disease, stage 3b: Secondary | ICD-10-CM | POA: Diagnosis not present

## 2024-04-21 ENCOUNTER — Ambulatory Visit (HOSPITAL_BASED_OUTPATIENT_CLINIC_OR_DEPARTMENT_OTHER): Payer: Medicare HMO

## 2024-04-21 DIAGNOSIS — I6523 Occlusion and stenosis of bilateral carotid arteries: Secondary | ICD-10-CM

## 2024-04-24 ENCOUNTER — Ambulatory Visit

## 2024-04-24 ENCOUNTER — Ambulatory Visit (HOSPITAL_BASED_OUTPATIENT_CLINIC_OR_DEPARTMENT_OTHER): Payer: Self-pay | Admitting: Family

## 2024-04-24 ENCOUNTER — Ambulatory Visit
Admission: RE | Admit: 2024-04-24 | Discharge: 2024-04-24 | Disposition: A | Source: Ambulatory Visit | Attending: Acute Care | Admitting: Acute Care

## 2024-04-24 DIAGNOSIS — J479 Bronchiectasis, uncomplicated: Secondary | ICD-10-CM | POA: Diagnosis not present

## 2024-04-24 DIAGNOSIS — R911 Solitary pulmonary nodule: Secondary | ICD-10-CM

## 2024-04-24 DIAGNOSIS — I6340 Cerebral infarction due to embolism of unspecified cerebral artery: Secondary | ICD-10-CM | POA: Diagnosis not present

## 2024-04-24 DIAGNOSIS — J432 Centrilobular emphysema: Secondary | ICD-10-CM | POA: Diagnosis not present

## 2024-04-24 DIAGNOSIS — I6523 Occlusion and stenosis of bilateral carotid arteries: Secondary | ICD-10-CM

## 2024-04-24 LAB — CUP PACEART REMOTE DEVICE CHECK: Date Time Interrogation Session: 20250929000409

## 2024-04-24 NOTE — Telephone Encounter (Signed)
 Carotid results were sent to the pts active mychart account to the pts active mychart account to review.   Went ahead and placed the pts repeat carotids in one year in the system and scheduling will reach out to the pt closer to that time frame to arrange that appt.   Will endorse all of this to the pt in her active mychart account.   Will monitor for pt to review.

## 2024-04-24 NOTE — Telephone Encounter (Signed)
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  This is progression previous.  Continue atorvastatin  80 mg daily to prevent progression.  Recommend  repeat carotid duplex in 1 year for monitoring.  Needs to schedule overdue cardiology follow-up. ----- Message ----- From: Interface, Three One Seven Sent: 04/21/2024   3:57 PM EDT To: Reche GORMAN Finder, NP

## 2024-04-25 ENCOUNTER — Ambulatory Visit: Payer: Self-pay | Admitting: Cardiology

## 2024-04-25 DIAGNOSIS — I739 Peripheral vascular disease, unspecified: Secondary | ICD-10-CM | POA: Diagnosis not present

## 2024-04-25 DIAGNOSIS — I4891 Unspecified atrial fibrillation: Secondary | ICD-10-CM | POA: Diagnosis not present

## 2024-04-25 DIAGNOSIS — I10 Essential (primary) hypertension: Secondary | ICD-10-CM | POA: Diagnosis not present

## 2024-04-25 DIAGNOSIS — N1832 Chronic kidney disease, stage 3b: Secondary | ICD-10-CM | POA: Diagnosis not present

## 2024-04-25 DIAGNOSIS — J449 Chronic obstructive pulmonary disease, unspecified: Secondary | ICD-10-CM | POA: Diagnosis not present

## 2024-04-26 NOTE — Progress Notes (Signed)
 Remote Loop Recorder Transmission

## 2024-04-27 NOTE — Progress Notes (Signed)
 Remote Loop Recorder Transmission

## 2024-05-01 ENCOUNTER — Telehealth: Payer: Self-pay

## 2024-05-01 DIAGNOSIS — R911 Solitary pulmonary nodule: Secondary | ICD-10-CM

## 2024-05-01 NOTE — Telephone Encounter (Signed)
 Results reviewed by [place], NP. Please call the patient and review CT results. Pt will need a 3 month f/u scan and an in office appt with Lauraine to review results. Provider may need to discuss the need for a vest at appointment. Place order. Send results and plan to PCP.     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   Solid appearing components in posteromedial right lower lobe and left upper lobe as detail above may represent superimposing likely due to waning and waning infection/inflammation.   Lung-RADS 4A, suspicious. Follow up low-dose chest CT without contrast in 3 months (please use the following order, CT CHEST LCS NODULE FOLLOW-UP W/O CM) is recommended. Alternatively, PET may be considered when there is a solid component 8mm or larger. Malignancy can not be excluded. Recommend continued attention on follow-up to ensure stability/resolution.

## 2024-05-02 NOTE — Telephone Encounter (Signed)
 Called and spoke to pt. Informed her of the results of her LDCT and the recommendations for a 3 month follow up scan due to nodule size. Patient states she hasn't been noticeably sick but states this time of year is normally worse for her breathing. Advised pt to seek emergency care or reach out to PCP if she does feel worsening symptoms. Patient verbalized understanding. Patient is agreeable to follow up scan, this has been scheduled for 07/24/2024 at [facility]. Results and plan sent to PCP.

## 2024-05-02 NOTE — Addendum Note (Signed)
 Addended by: RHETT KELLY POUR on: 05/02/2024 11:25 AM   Modules accepted: Orders

## 2024-05-20 DIAGNOSIS — I10 Essential (primary) hypertension: Secondary | ICD-10-CM | POA: Diagnosis not present

## 2024-05-20 DIAGNOSIS — I4891 Unspecified atrial fibrillation: Secondary | ICD-10-CM | POA: Diagnosis not present

## 2024-05-20 DIAGNOSIS — N1832 Chronic kidney disease, stage 3b: Secondary | ICD-10-CM | POA: Diagnosis not present

## 2024-05-20 DIAGNOSIS — I739 Peripheral vascular disease, unspecified: Secondary | ICD-10-CM | POA: Diagnosis not present

## 2024-05-25 ENCOUNTER — Ambulatory Visit (INDEPENDENT_AMBULATORY_CARE_PROVIDER_SITE_OTHER)

## 2024-05-25 DIAGNOSIS — I6340 Cerebral infarction due to embolism of unspecified cerebral artery: Secondary | ICD-10-CM

## 2024-05-25 LAB — CUP PACEART REMOTE DEVICE CHECK: Date Time Interrogation Session: 20251030000235

## 2024-05-25 NOTE — Progress Notes (Signed)
 Remote Loop Recorder Transmission

## 2024-05-26 ENCOUNTER — Ambulatory Visit: Payer: Self-pay | Admitting: Cardiology

## 2024-05-26 DIAGNOSIS — R7309 Other abnormal glucose: Secondary | ICD-10-CM | POA: Diagnosis not present

## 2024-05-26 DIAGNOSIS — Z7901 Long term (current) use of anticoagulants: Secondary | ICD-10-CM | POA: Diagnosis not present

## 2024-05-26 DIAGNOSIS — R2681 Unsteadiness on feet: Secondary | ICD-10-CM | POA: Diagnosis not present

## 2024-05-26 DIAGNOSIS — J449 Chronic obstructive pulmonary disease, unspecified: Secondary | ICD-10-CM | POA: Diagnosis not present

## 2024-05-26 DIAGNOSIS — N1832 Chronic kidney disease, stage 3b: Secondary | ICD-10-CM | POA: Diagnosis not present

## 2024-05-26 DIAGNOSIS — R296 Repeated falls: Secondary | ICD-10-CM | POA: Diagnosis not present

## 2024-05-26 DIAGNOSIS — I10 Essential (primary) hypertension: Secondary | ICD-10-CM | POA: Diagnosis not present

## 2024-05-26 DIAGNOSIS — I4891 Unspecified atrial fibrillation: Secondary | ICD-10-CM | POA: Diagnosis not present

## 2024-05-26 DIAGNOSIS — I739 Peripheral vascular disease, unspecified: Secondary | ICD-10-CM | POA: Diagnosis not present

## 2024-05-29 ENCOUNTER — Other Ambulatory Visit (HOSPITAL_COMMUNITY): Payer: Self-pay | Admitting: Family Medicine

## 2024-05-29 DIAGNOSIS — R2681 Unsteadiness on feet: Secondary | ICD-10-CM

## 2024-05-30 ENCOUNTER — Other Ambulatory Visit: Payer: Self-pay | Admitting: Family Medicine

## 2024-05-30 DIAGNOSIS — R2681 Unsteadiness on feet: Secondary | ICD-10-CM

## 2024-06-01 ENCOUNTER — Encounter (HOSPITAL_COMMUNITY): Payer: Self-pay

## 2024-06-01 ENCOUNTER — Other Ambulatory Visit: Payer: Self-pay | Admitting: Cardiovascular Disease

## 2024-06-01 ENCOUNTER — Ambulatory Visit (HOSPITAL_COMMUNITY): Admission: RE | Admit: 2024-06-01 | Source: Ambulatory Visit

## 2024-06-19 DIAGNOSIS — I739 Peripheral vascular disease, unspecified: Secondary | ICD-10-CM | POA: Diagnosis not present

## 2024-06-19 DIAGNOSIS — I10 Essential (primary) hypertension: Secondary | ICD-10-CM | POA: Diagnosis not present

## 2024-06-19 DIAGNOSIS — N1832 Chronic kidney disease, stage 3b: Secondary | ICD-10-CM | POA: Diagnosis not present

## 2024-06-19 DIAGNOSIS — I4891 Unspecified atrial fibrillation: Secondary | ICD-10-CM | POA: Diagnosis not present

## 2024-06-25 ENCOUNTER — Ambulatory Visit

## 2024-06-25 DIAGNOSIS — I739 Peripheral vascular disease, unspecified: Secondary | ICD-10-CM | POA: Diagnosis not present

## 2024-06-25 DIAGNOSIS — N1832 Chronic kidney disease, stage 3b: Secondary | ICD-10-CM | POA: Diagnosis not present

## 2024-06-25 DIAGNOSIS — I4891 Unspecified atrial fibrillation: Secondary | ICD-10-CM | POA: Diagnosis not present

## 2024-06-25 DIAGNOSIS — J449 Chronic obstructive pulmonary disease, unspecified: Secondary | ICD-10-CM | POA: Diagnosis not present

## 2024-06-25 DIAGNOSIS — I10 Essential (primary) hypertension: Secondary | ICD-10-CM | POA: Diagnosis not present

## 2024-06-28 ENCOUNTER — Ambulatory Visit: Attending: Cardiology

## 2024-06-28 DIAGNOSIS — I6340 Cerebral infarction due to embolism of unspecified cerebral artery: Secondary | ICD-10-CM

## 2024-06-28 LAB — CUP PACEART REMOTE DEVICE CHECK: Date Time Interrogation Session: 20251203000012

## 2024-07-04 NOTE — Progress Notes (Signed)
 Remote Loop Recorder Transmission

## 2024-07-05 ENCOUNTER — Ambulatory Visit: Payer: Self-pay | Admitting: Cardiology

## 2024-07-07 ENCOUNTER — Ambulatory Visit (INDEPENDENT_AMBULATORY_CARE_PROVIDER_SITE_OTHER): Admitting: Family

## 2024-07-07 ENCOUNTER — Encounter (HOSPITAL_BASED_OUTPATIENT_CLINIC_OR_DEPARTMENT_OTHER): Payer: Self-pay | Admitting: Family

## 2024-07-07 VITALS — BP 132/72 | HR 54 | Ht 59.0 in | Wt 190.9 lb

## 2024-07-07 DIAGNOSIS — R600 Localized edema: Secondary | ICD-10-CM | POA: Diagnosis not present

## 2024-07-07 DIAGNOSIS — E785 Hyperlipidemia, unspecified: Secondary | ICD-10-CM | POA: Diagnosis not present

## 2024-07-07 DIAGNOSIS — I10 Essential (primary) hypertension: Secondary | ICD-10-CM

## 2024-07-07 DIAGNOSIS — I480 Paroxysmal atrial fibrillation: Secondary | ICD-10-CM | POA: Diagnosis not present

## 2024-07-07 DIAGNOSIS — I6523 Occlusion and stenosis of bilateral carotid arteries: Secondary | ICD-10-CM

## 2024-07-07 MED ORDER — ATORVASTATIN CALCIUM 80 MG PO TABS: 80.0000 mg | ORAL_TABLET | Freq: Every day | ORAL | 3 refills | Status: DC

## 2024-07-07 MED ORDER — FUROSEMIDE 20 MG PO TABS: 20.0000 mg | ORAL_TABLET | Freq: Every day | ORAL | Status: AC | PRN

## 2024-07-07 NOTE — Progress Notes (Signed)
 Cardiology Office Note:  .   Date:  07/07/2024  ID:  Norma Scott, DOB 17-Oct-1950, MRN [id number] PCP: Claudene Pellet, MD  Nephrologist: Dr. Gearline [facility] Providers Cardiologist:  Annabella Scarce, MD    History of Present Illness: .   Norma Scott is a 73 y.o. female with a hx of CAD, carotid artery occlusion, TIA, PVD s/p R CEA and s/p femoral endarterectomy and right common iliac artery stenting, hypertension, hyperlipidemia, fibromyalgia, COPD, OSA on CPAP, paroxysmal atrial fibrillation . Initially diagnosed with atrial fibrillation on ILR which was placed for cryptogenic stroke in 2021.    Initial visit for atrial fibrillation 12/17/2021 with episode lasting as long as 4.5 hours.  She was asymptomatic.  Due to CHA2DS2-VASc of 6 she was started on Eliquis  5 mg twice daily.  Aspirin  continued due to PAD. Myoview  02/2022 due to dyspnea negative for ischemia.    At visit 11/24/22 Amlodipine  resumed as BP not at goal.   At visit 02/23/23 she was hypotensive 80/60 and lightheaded.  She was recommended for ED evaluation.  Rocephin  and doxycycline  given for cross coverage of UTI (Ecoli on urine culture) and possible PNA.  BMP with elevated creatinine 1.32.  CBC with no anemia hemoglobin 13.1.  She was encouraged monitor BP at home.  It did improve in the ED after fluid bolus.  Increased oral hydration was recommended.  Carotid duplex 03/2024 right ICA 1-39% stenosis, left ICA 60 to 79% stenosis.  Recommended for repeat carotid duplex in 1 year.  Most recent remote device check of ILR 06/28/2024 unremarkable.  Presents today for follow up. Checks BP at home with device that transmits to her primary care team office. Enjoys spending time with her new puppy. Taking PRN Lasix  for edema very sparingly. Reports no shortness of breath nor dyspnea on exertion. Reports no chest pain, pressure, or tightness. No edema, orthopnea, PND. Reports no palpitations.    Labs via  [facility]: 06/2023 LDL 85, 04/2024 Hb 13.6  ROS: Please see the history of present illness.    All other systems reviewed and are negative.   Studies Reviewed: SABRA   EKG Interpretation Date/Time:  Friday July 07 2024 13:53:30 EST Ventricular Rate:  55 PR Interval:  138 QRS Duration:  78 QT Interval:  440 QTC Calculation: 420 R Axis:   44  Text Interpretation: Sinus bradycardia No acute ST/T wave changes. Confirmed by Vannie Mora ([id number]) on 07/07/2024 1:55:12 PM    Cardiac Studies & Procedures   ______________________________________________________________________________________________   STRESS TESTS  MYOCARDIAL PERFUSION IMAGING 03/13/2022  Interpretation Summary   The study is normal. Findings are consistent with no prior ischemia and no prior myocardial infarction. The study is low risk.   No ST deviation was noted.   LV perfusion is normal. There is no evidence of ischemia. There is no evidence of infarction.   Left ventricular function is normal. Nuclear stress EF: 52 %. The left ventricular ejection fraction is mildly decreased (45-54%). End diastolic cavity size is normal. End systolic cavity size is normal.   Prior study available for comparison from 01/21/2015.  Images not availabel for comparison.   ECHOCARDIOGRAM  ECHOCARDIOGRAM COMPLETE 02/22/2020  Narrative ECHOCARDIOGRAM REPORT    Patient Name:   Norma Scott [facility] Date of Exam: 02/22/2020 Medical Rec #:  [id number]              Height:       60.0 in Accession #:    [id number]  Weight:       187.4 lb Date of Birth:  March 15, 1951              BSA:          1.816 m Patient Age:    68 years               BP:           112/45 mmHg Patient Gender: F                      HR:           64 bpm. Exam Location:  Inpatient  Procedure: 2D Echo, Cardiac Doppler and Color Doppler  Indications:    TIA  History:        Patient has prior history of Echocardiogram examinations, most recent 01/19/2015.  COPD; Risk Factors:Current Smoker, Hypertension and Dyslipidemia.  Sonographer:    Rome Eans RDCS (AE) Referring Phys: [phone] DEBBY CROSLEY   Sonographer Comments: Patient is morbidly obese and suboptimal parasternal window. IMPRESSIONS   1. Left ventricular ejection fraction, by estimation, is 60 to 65%. The left ventricle has normal function. The left ventricle has no regional wall motion abnormalities. There is moderate left ventricular hypertrophy. Left ventricular diastolic parameters are indeterminate. 2. Right ventricular systolic function is normal. The right ventricular size is normal. Tricuspid regurgitation signal is inadequate for assessing PA pressure. 3. The mitral valve is grossly normal. No evidence of mitral valve regurgitation. 4. The aortic valve was not well visualized. Aortic valve regurgitation is not visualized. Mild to moderate aortic valve sclerosis/calcification is present, without any evidence of aortic stenosis. 5. The inferior vena cava is normal in size with greater than 50% respiratory variability, suggesting right atrial pressure of 3 mmHg.  FINDINGS Left Ventricle: Left ventricular ejection fraction, by estimation, is 60 to 65%. The left ventricle has normal function. The left ventricle has no regional wall motion abnormalities. The left ventricular internal cavity size was normal in size. There is moderate left ventricular hypertrophy. Left ventricular diastolic parameters are indeterminate.  Right Ventricle: The right ventricular size is normal. Right vetricular wall thickness was not assessed. Right ventricular systolic function is normal. Tricuspid regurgitation signal is inadequate for assessing PA pressure.  Left Atrium: Left atrial size was normal in size.  Right Atrium: Right atrial size was normal in size.  Pericardium: Trivial pericardial effusion is present. Presence of pericardial fat pad.  Mitral Valve: The mitral valve is grossly normal. No  evidence of mitral valve regurgitation. MV peak gradient, 3.8 mmHg. The mean mitral valve gradient is 1.0 mmHg.  Tricuspid Valve: The tricuspid valve is grossly normal. Tricuspid valve regurgitation is not demonstrated.  Aortic Valve: The aortic valve was not well visualized. Aortic valve regurgitation is not visualized. Mild to moderate aortic valve sclerosis/calcification is present, without any evidence of aortic stenosis. Aortic valve mean gradient measures 4.0 mmHg. Aortic valve peak gradient measures 8.5 mmHg. Aortic valve area, by VTI measures 2.01 cm.  Pulmonic Valve: The pulmonic valve was not well visualized. Pulmonic valve regurgitation is not visualized.  Aorta: The aortic root is normal in size and structure.  Venous: The inferior vena cava is normal in size with greater than 50% respiratory variability, suggesting right atrial pressure of 3 mmHg.  IAS/Shunts: The interatrial septum was not well visualized.   LEFT VENTRICLE PLAX 2D LVIDd:         4.40 cm  Diastology LVIDs:  3.00 cm  LV e' lateral:   11.30 cm/s LV PW:         1.50 cm  LV E/e' lateral: 6.8 LV IVS:        1.40 cm  LV e' medial:    5.00 cm/s LVOT diam:     2.00 cm  LV E/e' medial:  15.3 LV SV:         66 LV SV Index:   37 LVOT Area:     3.14 cm   RIGHT VENTRICLE             IVC RV Basal diam:  3.00 cm     IVC diam: 1.40 cm RV S prime:     13.20 cm/s TAPSE (M-mode): 2.0 cm  LEFT ATRIUM             Index       RIGHT ATRIUM           Index LA diam:        3.30 cm 1.82 cm/m  RA Area:     13.70 cm LA Vol (A2C):   42.0 ml 23.13 ml/m RA Volume:   29.90 ml  16.47 ml/m LA Vol (A4C):   32.6 ml 17.95 ml/m LA Biplane Vol: 37.4 ml 20.60 ml/m AORTIC VALVE AV Area (Vmax):    2.01 cm AV Area (Vmean):   2.04 cm AV Area (VTI):     2.01 cm AV Vmax:           146.00 cm/s AV Vmean:          96.600 cm/s AV VTI:            0.329 m AV Peak Grad:      8.5 mmHg AV Mean Grad:      4.0 mmHg LVOT Vmax:          93.30 cm/s LVOT Vmean:        62.600 cm/s LVOT VTI:          0.211 m LVOT/AV VTI ratio: 0.64  AORTA Ao Root diam: 3.20 cm  MITRAL VALVE MV Area (PHT): 2.48 cm    SHUNTS MV Peak grad:  3.8 mmHg    Systemic VTI:  0.21 m MV Mean grad:  1.0 mmHg    Systemic Diam: 2.00 cm MV Vmax:       0.97 m/s MV Vmean:      52.1 cm/s MV Decel Time: 306 msec MV E velocity: 76.30 cm/s MV A velocity: 91.70 cm/s MV E/A ratio:  0.83  Lonni Nanas MD Electronically signed by Lonni Nanas MD Signature Date/Time: 02/22/2020/10:33:19 AM    Final          ______________________________________________________________________________________________        Risk Assessment/Calculations:    CHA2DS2-VASc Score =     This indicates a  % annual risk of stroke. The patient's score is based upon:              Physical Exam:   VS:  BP 132/72   Pulse (!) 54   Ht 4' 11 (1.499 m)   Wt 190 lb 14.4 oz (86.6 kg)   SpO2 97%   BMI 38.56 kg/m    Wt Readings from Last 3 Encounters:  07/07/24 190 lb 14.4 oz (86.6 kg)  04/20/23 193 lb 3.2 oz (87.6 kg)  02/23/23 192 lb (87.1 kg)    GEN: Well nourished, overweight, well developed in no acute distress NECK: No JVD; No carotid bruits CARDIAC: RRR,  no murmurs, rubs, gallops RESPIRATORY:  Clear to auscultation without rales, wheezing or rhonchi  ABDOMEN: Soft, non-tender, non-distended EXTREMITIES:  No edema; No deformity   ASSESSMENT AND PLAN: .    HTN- BP controlled. Present antihypertensive regimen and amlodipine  5 mg daily, lisinopril  30 mg daily. Discussed to monitor BP at home at least 2 hours after medications and sitting for 5-10 minutes.  10/27/2022 renal duplex with no stenosis.   PAF/hypercoagulable state- no palpitations. SB by EKG today. Not requiring AV nodal blocking agent. No longer on OAC. Due for follow up with AFib clinic. Recent Linq reports unremarkable.  Aortic atherosclerosis/HDL, LDL goal less than 70-  Stable with no anginal symptoms. No indication for ischemic evaluation.  06/2023 LDL 85. Upcoming visit with primary care with repeat labs. If LDL remains >70, consider Zetia. Atorvastatin  80mg  daily refill provided.   CKD -follows with Dr. Gearline with nephrology. Careful titration of diuretic and antihypertensive.     Bilateral carotid stenosis-s/p right CEA.  Carotid Doppler 03/2022 with right ICA 1 to 39% stenosis, left ICA 40-59% stenosis.  Repeat study in 1 year already ordered.  Continue optimal lipid and blood pressure control.   PAD- Prior femoral endarterectomy. Follows with Dr. Magda. No claudication symptoms.    OSA - Follows with Dr. Neda. CPAP compliance encouraged.        Dispo: follow up in 6 months with AFib Clinic and in 1 year with Dr. Raford or APP  Signed, Reche GORMAN Finder, NP

## 2024-07-07 NOTE — Patient Instructions (Signed)
 Medication Instructions:  Continue your current medications.   *If you need a refill on your cardiac medications before your next appointment, please call your pharmacy*  Testing/Procedures: Your EKG today looked good!  Follow-Up: At [facility], you and your health needs are our priority.  As part of our continuing mission to provide you with exceptional heart care, our providers are all part of one team.  This team includes your primary Cardiologist (physician) and Advanced Practice Providers or APPs (Physician Assistants and Nurse Practitioners) who all work together to provide you with the care you need, when you need it.  Your next appointment:   Follow up with AFib Clinic [place], [place] in 6 months  AND In 1 year with Dr. Raford or Reche GORMAN Finder, NP   We recommend signing up for the patient portal called MyChart.  Sign up information is provided on this After Visit Summary.  MyChart is used to connect with patients for Virtual Visits (Telemedicine).  Patients are able to view lab/test results, encounter notes, upcoming appointments, etc.  Non-urgent messages can be sent to your provider as well.   To learn more about what you can do with MyChart, go to [email or web address].   Other Instructions  Heart Healthy Diet Recommendations: A low-salt diet is recommended. Meats should be grilled, baked, or boiled. Avoid fried foods. Focus on lean protein sources like fish or chicken with vegetables and fruits. The American Heart Association is a Chief Technology Officer!  American Heart Association Diet and Lifeystyle Recommendations   Exercise recommendations: The American Heart Association recommends 150 minutes of moderate intensity exercise weekly. Try 30 minutes of moderate intensity exercise 4-5 times per week. This could include walking, jogging, or swimming.

## 2024-07-17 ENCOUNTER — Ambulatory Visit: Admitting: Diagnostic Neuroimaging

## 2024-07-18 ENCOUNTER — Encounter: Payer: Self-pay | Admitting: Acute Care

## 2024-07-24 ENCOUNTER — Ambulatory Visit
Admission: RE | Admit: 2024-07-24 | Discharge: 2024-07-24 | Disposition: A | Discharge status: Home / Self Care | Source: Ambulatory Visit | Attending: Acute Care | Admitting: Acute Care

## 2024-07-24 DIAGNOSIS — R911 Solitary pulmonary nodule: Secondary | ICD-10-CM

## 2024-07-26 ENCOUNTER — Ambulatory Visit

## 2024-07-28 ENCOUNTER — Other Ambulatory Visit: Payer: Self-pay | Admitting: Cardiovascular Disease

## 2024-07-28 DIAGNOSIS — E785 Hyperlipidemia, unspecified: Secondary | ICD-10-CM

## 2024-07-29 ENCOUNTER — Ambulatory Visit: Attending: Cardiology

## 2024-07-29 DIAGNOSIS — I480 Paroxysmal atrial fibrillation: Secondary | ICD-10-CM | POA: Diagnosis not present

## 2024-07-31 ENCOUNTER — Ambulatory Visit: Payer: Self-pay | Admitting: Cardiology

## 2024-07-31 LAB — CUP PACEART REMOTE DEVICE CHECK: Date Time Interrogation Session: 20260103000212

## 2024-08-02 ENCOUNTER — Other Ambulatory Visit: Payer: Self-pay

## 2024-08-02 ENCOUNTER — Telehealth: Payer: Self-pay

## 2024-08-02 DIAGNOSIS — R911 Solitary pulmonary nodule: Secondary | ICD-10-CM

## 2024-08-02 DIAGNOSIS — Z122 Encounter for screening for malignant neoplasm of respiratory organs: Secondary | ICD-10-CM

## 2024-08-02 DIAGNOSIS — Z87891 Personal history of nicotine dependence: Secondary | ICD-10-CM

## 2024-08-02 NOTE — Telephone Encounter (Signed)
 Spoke with patient's caregiver Evalene, on [place], patient in background. Reviewed results, She is in an agreement to a 6 month follow up scan to evaluate nodule stability. Scheduled for 01/23/2025. Pt would like to reevaluate possible ILD at that time and if a referral for ILD is possible needed after 6 month follow up scan. Order placed. Results and plan to PCP.   IMPRESSION: 1. Resolved mixed attenuation lesion in the posterior segment right upper lobe. 8 mm nodular consolidation in the left upper lobe is unchanged. Lung-RADS 3, probably benign findings. Short-term follow-up in 6 months is recommended with repeat low-dose chest CT without contrast (please use the following order, CT CHEST LCS NODULE FOLLOW-UP W/O CM). 2. 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 If further evaluation is desired, high-resolution chest CT without contrast could be performed. 3. Aortic atherosclerosis (ICD10-I70.0). Coronary artery calcification. 4. Enlarged pulmonic trunk, indicative of pulmonary arterial hypertension. 5.  Emphysema (ICD10-J43.9).

## 2024-08-03 NOTE — Progress Notes (Signed)
 Remote Loop Recorder Transmission

## 2024-08-26 ENCOUNTER — Ambulatory Visit

## 2024-08-29 ENCOUNTER — Ambulatory Visit

## 2024-08-29 LAB — CUP PACEART REMOTE DEVICE CHECK: Date Time Interrogation Session: 20260203000256

## 2024-09-29 ENCOUNTER — Ambulatory Visit

## 2024-10-30 ENCOUNTER — Ambulatory Visit

## 2024-11-30 ENCOUNTER — Ambulatory Visit

## 2024-12-31 ENCOUNTER — Ambulatory Visit

## 2025-01-23 ENCOUNTER — Other Ambulatory Visit

## 2025-01-31 ENCOUNTER — Ambulatory Visit

## 2025-03-03 ENCOUNTER — Ambulatory Visit

## 2025-04-03 ENCOUNTER — Ambulatory Visit

## 2025-04-23 ENCOUNTER — Encounter (HOSPITAL_BASED_OUTPATIENT_CLINIC_OR_DEPARTMENT_OTHER)

## 2025-05-04 ENCOUNTER — Ambulatory Visit

## 2025-06-04 ENCOUNTER — Ambulatory Visit

## 2025-07-05 ENCOUNTER — Ambulatory Visit

## 2025-08-05 ENCOUNTER — Ambulatory Visit
